# Patient Record
Sex: Male | Born: 1940 | Race: Black or African American | Hispanic: No | Marital: Single | Smoking: Current every day smoker
Health system: Southern US, Community
[De-identification: ages and names within clinical notes are randomized; demographics above are authoritative.]

## PROBLEM LIST (undated history)

## (undated) DIAGNOSIS — E7800 Pure hypercholesterolemia, unspecified: Secondary | ICD-10-CM

## (undated) DIAGNOSIS — N400 Enlarged prostate without lower urinary tract symptoms: Secondary | ICD-10-CM

## (undated) DIAGNOSIS — F209 Schizophrenia, unspecified: Secondary | ICD-10-CM

## (undated) DIAGNOSIS — N3281 Overactive bladder: Secondary | ICD-10-CM

## (undated) DIAGNOSIS — F03918 Unspecified dementia, unspecified severity, with other behavioral disturbance: Secondary | ICD-10-CM

## (undated) DIAGNOSIS — F0391 Unspecified dementia with behavioral disturbance: Secondary | ICD-10-CM

## (undated) DIAGNOSIS — F0390 Unspecified dementia without behavioral disturbance: Secondary | ICD-10-CM

## (undated) DIAGNOSIS — E119 Type 2 diabetes mellitus without complications: Secondary | ICD-10-CM

## (undated) DIAGNOSIS — G309 Alzheimer's disease, unspecified: Secondary | ICD-10-CM

## (undated) DIAGNOSIS — E785 Hyperlipidemia, unspecified: Secondary | ICD-10-CM

## (undated) DIAGNOSIS — F0280 Dementia in other diseases classified elsewhere without behavioral disturbance: Secondary | ICD-10-CM

## (undated) DIAGNOSIS — J449 Chronic obstructive pulmonary disease, unspecified: Secondary | ICD-10-CM

## (undated) DIAGNOSIS — K219 Gastro-esophageal reflux disease without esophagitis: Secondary | ICD-10-CM

## (undated) DIAGNOSIS — I10 Essential (primary) hypertension: Secondary | ICD-10-CM

## (undated) HISTORY — DX: Hyperlipidemia, unspecified: E78.5

## (undated) HISTORY — PX: OTHER SURGICAL HISTORY: SHX169

## (undated) HISTORY — DX: Chronic obstructive pulmonary disease, unspecified: J44.9

## (undated) HISTORY — DX: Benign prostatic hyperplasia without lower urinary tract symptoms: N40.0

## (undated) HISTORY — DX: Type 2 diabetes mellitus without complications: E11.9

## (undated) HISTORY — DX: Unspecified dementia, unspecified severity, with behavioral disturbance: F03.91

## (undated) HISTORY — DX: Unspecified dementia, unspecified severity, with other behavioral disturbance: F03.918

---

## 2001-07-21 ENCOUNTER — Emergency Department (HOSPITAL_COMMUNITY): Admission: EM | Admit: 2001-07-21 | Discharge: 2001-07-21 | Payer: Self-pay | Admitting: *Deleted

## 2001-08-28 ENCOUNTER — Encounter: Payer: Self-pay | Admitting: Emergency Medicine

## 2001-08-28 ENCOUNTER — Inpatient Hospital Stay (HOSPITAL_COMMUNITY): Admission: EM | Admit: 2001-08-28 | Discharge: 2001-09-15 | Payer: Self-pay | Admitting: Emergency Medicine

## 2008-10-16 ENCOUNTER — Inpatient Hospital Stay (HOSPITAL_COMMUNITY): Admission: EM | Admit: 2008-10-16 | Discharge: 2008-10-18 | Payer: Self-pay | Admitting: Emergency Medicine

## 2009-02-13 ENCOUNTER — Ambulatory Visit (HOSPITAL_COMMUNITY): Admission: RE | Admit: 2009-02-13 | Discharge: 2009-02-13 | Payer: Self-pay | Admitting: Pulmonary Disease

## 2009-03-01 ENCOUNTER — Ambulatory Visit (HOSPITAL_COMMUNITY): Admission: RE | Admit: 2009-03-01 | Discharge: 2009-03-01 | Payer: Self-pay | Admitting: Pulmonary Disease

## 2010-09-10 ENCOUNTER — Encounter: Payer: Self-pay | Admitting: Pulmonary Disease

## 2010-11-29 LAB — GLUCOSE, CAPILLARY
Glucose-Capillary: 132 mg/dL — ABNORMAL HIGH (ref 70–99)
Glucose-Capillary: 136 mg/dL — ABNORMAL HIGH (ref 70–99)
Glucose-Capillary: 86 mg/dL (ref 70–99)
Glucose-Capillary: 93 mg/dL (ref 70–99)

## 2010-12-04 LAB — DIFFERENTIAL
Basophils Relative: 0 % (ref 0–1)
Eosinophils Absolute: 0 10*3/uL (ref 0.0–0.7)
Monocytes Absolute: 0 10*3/uL — ABNORMAL LOW (ref 0.1–1.0)
Monocytes Relative: 1 % — ABNORMAL LOW (ref 3–12)
Neutro Abs: 7.5 10*3/uL (ref 1.7–7.7)

## 2010-12-04 LAB — COMPREHENSIVE METABOLIC PANEL
ALT: 113 U/L — ABNORMAL HIGH (ref 0–53)
AST: 156 U/L — ABNORMAL HIGH (ref 0–37)
Albumin: 3.2 g/dL — ABNORMAL LOW (ref 3.5–5.2)
Alkaline Phosphatase: 79 U/L (ref 39–117)
GFR calc Af Amer: >60 mL/min (ref >60)
Potassium: 3.1 mEq/L — ABNORMAL LOW (ref 3.5–5.1)
Sodium: 137 mEq/L (ref 135–145)
Total Protein: 5.3 g/dL — ABNORMAL LOW (ref 6.0–8.3)

## 2010-12-04 LAB — CULTURE, BLOOD (ROUTINE X 2)
Culture: NO GROWTH
Report Status: 3052010

## 2010-12-04 LAB — CBC
Platelets: 163 10*3/uL (ref 150–400)
RDW: 15.7 % — ABNORMAL HIGH (ref 11.5–15.5)

## 2010-12-04 LAB — URINALYSIS, ROUTINE W REFLEX MICROSCOPIC
Glucose, UA: NEGATIVE mg/dL
Hgb urine dipstick: NEGATIVE
Protein, ur: 100 mg/dL — AB

## 2010-12-04 LAB — URINE CULTURE: Colony Count: NO GROWTH

## 2010-12-04 LAB — GLUCOSE, CAPILLARY
Glucose-Capillary: 114 mg/dL — ABNORMAL HIGH (ref 70–99)
Glucose-Capillary: 133 mg/dL — ABNORMAL HIGH (ref 70–99)
Glucose-Capillary: 234 mg/dL — ABNORMAL HIGH (ref 70–99)

## 2010-12-04 LAB — URINE MICROSCOPIC-ADD ON

## 2011-01-01 NOTE — H&P (Signed)
NAME:  Andre Rowe, Andre Rowe           ACCOUNT NO.:  [id number]   MEDICAL RECORD NO.:  [id number]          PATIENT TYPE:  INP   LOCATION:  A329                          FACILITY:  [facility]   PHYSICIAN:  Kingsley Callander. Ouida Sills, MD       DATE OF BIRTH:  1941/05/16   DATE OF ADMISSION:  10/16/2008  DATE OF DISCHARGE:  LH                              HISTORY & PHYSICAL   CHIEF COMPLAINT:  Fever.   HISTORY OF PRESENT ILLNESS:  This patient is a 70 year old African  American schizophrenic who lives in a group home who was found weak,  less responsive, and covered in stool.  He was brought to the emergency  room and evaluated.  He was found to have a fever of 102.9 rectally.  He  had evidence of a urinary tract infection.  He has a history of BPH,  treated with Uroxatral, he is also on Detrol LA.  He is able to provide  only limited history.  He is alert, but his speech is difficult to  understand.  He denies any pain.  He admits that he has felt fevers and  chills.   PAST MEDICAL HISTORY:  1. Schizophrenia.  2. Dementia.  3. Diabetes.  4. Hyperlipidemia.   MEDICATIONS:  1. Klonopin 0.5 mg three times a day and 1 mg at night.  2. Detrol LA 4 mg daily.  3. Lumigan 0.03% 1 drop in each eye nightly.  4. Simvastatin 40 mg nightly.  5. Folic acid 1 mg daily.  6. Uroxatral 10 mg daily.  7. Omeprazole 20 mg daily.  8. Metformin 500 mg b.i.d.  9. Lantus 25 units daily.  10.Seroquel 25 mg nightly.  11.Namenda 10 mg b.i.d.  12.Tegretol 200 mg t.i.d.  13.Zyprexa 5 mg nightly.  14.Aricept 10 mg nightly.  15.Acetaminophen p.r.n.   ALLERGIES:  None.   SOCIAL HISTORY:  He smokes one cigar and about 1 cigarettes a day.  He  does not use alcohol or recreational substances.   FAMILY HISTORY:  Unknown.   REVIEW OF SYSTEMS:  No difficulty breathing.  His appetite has been  good.  He was in his usual state of health until this morning.  He has  not had problems with bowel habits, typically.   PHYSICAL  EXAMINATION:  VITAL SIGNS:  Temperature 102.9, blood pressure  99/53, pulse 105, respirations 22, and oxygen saturation 100%.  GENERAL:  He is alert, has some tardive dyskinesia type facial  movements.  HEENT:  No scleral icterus.  The pharynx is edentulous and moist.  NECK:  Supple with no thyromegaly or JVD.  LUNGS:  Clear.  HEART:  Tachycardic with no murmurs.  Rate is in the 110 range.  ABDOMEN:  Soft and nontender.  No hepatosplenomegaly.  No CVA  tenderness.  EXTREMITIES:  No cyanosis, clubbing, or edema.  NEUROLOGIC:  No focal weakness.  LYMPH NODES:  No cervical or supraclavicular enlargement.   LABORATORY DATA:  Urinalysis reveals many bacteria, 7-10 white cells,  and 100 mg per dL of protein.  Chest x-ray reveals a prominence of the  right hilum with atelectasis versus  a linear scar in the left base.  He  is also undergone a CT scan of the head which revealed no evidence of  acute intracranial abnormality.  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  White count 7.8, hemoglobin 13.5,  MCV 109.5, platelets 163, 97 segs, 3 lymphs, sodium 137, potassium 3.1,  bicarb 29, glucose 155, BUN 21, calcium 8.1, albumin 3.2, SGOT 156, SGPT  113, alkaline phosphatase 79, and bilirubin 0.7.   IMPRESSION:  1. Urinary tract infection.  He has bacteriuria and a change in his      mental status.  His blood pressure is dipped into the 80s systolic      and is currently receiving a fluid bolus with systolic BP back up      into the 90s, blood and urine cultures are being obtained.  He is      being treated with IV Rocephin.  He will require hospitalization.  2. Hypokalemia.  We will replace orally.  3. Diabetes.  We will decrease his Lantus dose to 10 units with      possible reduction and caloric intake with his illness.  He ate      very little earlier today.  4. Prominent pulmonary hila.  CT was recommended for further      evaluation.  5. Schizophrenia.   Continue current behavioral health meds.  6. Dementia.  7. Macrocytosis.  He is on folic acid.  His B12 status is not known.      Further evaluation per Dr. Juanetta Gosling.  8. Abnormal liver function tests, simvastatin will be held and these      numbers will be rechecked as his illnesses treated.  9. Glaucoma.  Continue Lumigan.      Kingsley Callander. Ouida Sills, MD  Electronically Signed     ROF/MEDQ  D:  10/16/2008  T:  10/16/2008  Job:  [id number]   cc:   Ramon Dredge L. Juanetta Gosling, M.D.  Fax: [phone]

## 2011-01-01 NOTE — Group Therapy Note (Signed)
NAME:  Andre Rowe, Andre Rowe           ACCOUNT NO.:  [id number]   MEDICAL RECORD NO.:  [id number]          PATIENT TYPE:  INP   LOCATION:  A329                          FACILITY:  [facility]   PHYSICIAN:  Edward L. Juanetta Gosling, M.D.DATE OF BIRTH:  [birth year]   DATE OF PROCEDURE:  DATE OF DISCHARGE:                                 PROGRESS NOTE   Andre Rowe has been admitted with change in mental status and  probably he has an urinary tract infection.  He is schizophrenic, so it  is difficult to tell about his mental status in general.  This morning,  he is back about to his baseline.  He mutters, but does not really speak  in sentences.   PHYSICAL EXAMINATION:  VITAL SIGNS:  His temperature is 99.3, pulse 101,  respirations 20, blood pressure 90/44, and O2 sat is 93% on room air.  CHEST:  Fairly clear.  HEART:  Regular.  ABDOMEN:  Soft.   ASSESSMENT:  He has got probable urinary tract infection.  He is  confused, but appears closer to his baseline now.   PLAN:  To continue with his antibiotics, fluids, etc.      Edward L. Juanetta Gosling, M.D.  Electronically Signed     ELH/MEDQ  D:  10/17/2008  T:  10/17/2008  Job:  [id number]

## 2011-01-04 NOTE — Group Therapy Note (Signed)
[facility]  Patient:    MAR, WALMER Visit Number: [id number] MRN: [id number]          Service Type: MED Location: 2A Z366 01 Attending Physician:  Hilario Quarry Dictated by:   Kari Baars, M.D. Proc. Date: 09/11/01 Admit Date:  08/28/2001                               Progress Note  SUBJECTIVE:  Mr. Maclellan remains confused but better.  He is more responsive and more communicative.  He continues to have falls, but this is because he is not being cooperative.  He has had restraints on.  He is being kept near the nurses station, but still will not ask for help to get up.  Otherwise he is doing okay.  He has no complaints this morning.  He is eating fairly well.  LABORATORY DATA:  His lab work this morning shows that his pH is 7.39, pO2 of 91, pCO2 of 60.  This is actually the best that he has had in some time.  His white blood count is 5700, hemoglobin 12.8, hematocrit 37.6, platelets 265. His Chem-7 is still pending.  ASSESSMENT:  Overall he does seem to be doing a great deal better, but still with multiple falls.  PLAN:  To continue with his treatments.  Depending upon the results of his Chem-7 today, we may be able to reduce is IV fluids. Dictated by:   Kari Baars, M.D. Attending Physician:  Hilario Quarry DD:  09/11/01 TD:  09/11/01 Job: [id number]

## 2011-01-04 NOTE — Group Therapy Note (Signed)
[facility]  Patient:    KARDER, GOODIN Visit Number: [id number] MRN: [id number]          Service Type: MED Location: 3A A330 01 Attending Physician:  Fredirick Maudlin Dictated by:   Kari Baars, M.D. Proc. Date: 09/14/01 Admit Date:  08/28/2001                               Progress Note  PROBLEMS 1. Diabetic ketoacidosis. 2. Chronic obstructive pulmonary disease. 3. Pneumonia. 4. Multiple electrolyte imbalances. 5. Schizophrenia.  SUBJECTIVE:  Mr. Heinzman continues to improve.  He was actually up and apparently attempting to bathe this morning.  He is still very confused, however, and has been not staying in his room.  OBJECTIVE:  His physical exam this morning shows that his sodium is down to 149, potassium 3.7, chloride 108, CO2 of 33, BUN 8, creatinine 1.1.  His blood sugar is 165.  ASSESSMENT:  He is much improved.  PLAN:  He is okay to move to third floor.  He is pretty much ready for discharge to a nursing home, and a bed can be found. Dictated by:   Kari Baars, M.D. Attending Physician:  Fredirick Maudlin DD:  09/14/01 TD:  09/14/01 Job: [id number]

## 2011-01-04 NOTE — Group Therapy Note (Signed)
[facility]  Patient:    Andre Rowe, Andre Rowe Visit Number: [id number] MRN: [id number]          Service Type: MED Location: 2A Y782 01 Attending Physician:  Hilario Quarry Dictated by:   Kari Baars, M.D. Proc. Date: 09/05/01 Admit Date:  08/28/2001                               Progress Note  PROBLEM 1. Diabetes mellitus. 2. Ketoacidosis. 3. Congestive heart failure. 4. Schizophrenia.  SUBJECTIVE:  The patient is much more alert than he has been.  He is able to respond a little bit.  He has been sitting up.  He has actually attempted to eat a little bit as well.  OBJECTIVE:  His exam this morning shows that his chest is much clearer than before.  He is more alert, although still not fully awake.  His abdomen is soft.  His extremities show no edema.  He did not have any lab work this morning, but his blood sugars are running around 130-140.  He did get out of bed twice again last night.  ASSESSMENT:  He continues having problems with confusion, with his schizophrenia, with his multiple medical problems.  His diabetes mellitus is better-controlled.  His O2 has been better.  He seems to be slowly coming around, although we still have difficulty with lack of cooperation.  PLAN:  Recheck labs tomorrow.  Continue with the alternating D-5-W in one-half normal saline.  Continue with his O2 at 1 L.  Continue antibiotics.  Look for improvement.  Try to get him to eat a little more.  I discussed all of this at length with his brother today, who has power of attorney.  His brother has not been able to come to a decision as to whether he would like a no-code blue order. Dictated by:   Kari Baars, M.D. Attending Physician:  Hilario Quarry DD:  09/05/01 TD:  09/07/01 Job: [id number]

## 2011-01-04 NOTE — Group Therapy Note (Signed)
[facility]  Patient:    Andre Rowe, Andre Rowe Visit Number: [id number] MRN: [id number]          Service Type: MED Location: 2A Y782 01 Attending Physician:  Hilario Quarry Dictated by:   Kari Baars, M.D. Proc. Date: 09/03/01 Admit Date:  08/28/2001                               Progress Note  SUBJECTIVE:  Andre Rowe continues to have problems with his known diabetic ketoacidosis, his electrolyte abnormalities, schizophrenia, and COPD.  He remains sluggish and poorly cooperative.  He has his oxygen off again, and we put it back on him.  He is otherwise about the same.  OBJECTIVE:  His physical exam shows that he is sluggish.  His chest is fairly clear without wheezes.  His heart is regular.  His abdomen is soft.  He is not communicating.  Lab work: His pH is 7.34, PO2 is 30, PCO2 is 63.  His O2 saturation was 72%; it is 88% now on 1 liter of O2.  He may have to go up on the O2, but first I would like to see what he does with leaving the oxygen on if we can keep it on him.  His white blood count is not back.  His metabolic 7 shows that his sodium is down to 173, potassium 3.94, chloride 133, bicarbonate 30, BUN 11, creatinine 1.2.  His sugar is 149.  ASSESSMENT:  He has multiple electrolyte abnormalities.  His sodium is still not down to normal.  He has chronic obstructive pulmonary disease and still has elevated CO2 and decreased PO2.  He has had problems with his potassium, but that is better.  His blood sugar appears to be doing well, and then he still has problems with cooperation.   I am afraid that will not make much change.  PLAN:  Continue with his fluids including potassium replacement for now, get his oxygen back on, continue with his q.4h. Accu-Cheks with sliding scale to keep control of his blood sugar, continue with safety measures so that he will not get hurt, and continue other medications as before.  Will repeat lab  work tomorrow. Dictated by:   Kari Baars, M.D. Attending Physician:  Hilario Quarry DD:  09/03/01 TD:  09/05/01 Job: [id number] NF/[id number]

## 2011-01-04 NOTE — Discharge Summary (Signed)
NAME:  Andre Rowe, Andre Rowe           ACCOUNT NO.:  [id number]   MEDICAL RECORD NO.:  [id number]          PATIENT TYPE:  INP   LOCATION:  A329                          FACILITY:  [facility]   PHYSICIAN:  Edward L. Juanetta Gosling, M.D.DATE OF BIRTH:  August 05, 1941   DATE OF ADMISSION:  10/16/2008  DATE OF DISCHARGE:  03/02/2010LH                               DISCHARGE SUMMARY   FINAL DISCHARGE DIAGNOSES:  1. Febrile illness, possible urinary tract infection but with negative      urine culture.  2. Schizophrenia.  3. Dementia.  4. Diabetes.  5. Hyperlipidemia.  6. Benign prostatic hypertrophy.  7. Gastroesophageal reflux disease.   Andre Rowe is a 70 year old who lives in a group home.  He was found  to be weak and less responsive and he was covered in stool.  He came to  the emergency room and was noted to have a temperature of 102.9 and had  evidence of UTI on his urinalysis.  His speech is very difficult to  understand so his history was limited.  Physical examination showed a  well-developed, well-nourished [place] American male.  Temperature was  102.9, blood pressure 99/53, pulse 105, respirations were 22.  He had  some tardive dyskinesia.  His chest was fairly clear.  His heart was  regular.  His abdomen was soft without masses.  Urinalysis showed many  white blood cells and bacteria.  CT showed prominence of the right  hilum.  CT brain showed no acute intracranial abnormality.  His white  blood count was 7800.  Assessment at the time of admission was that he  had a UTI.  He was started on intravenous antibiotics and given IV  fluids.  As he improved, however, he got very agitated and combative.  He pulled his IV on multiple occasions and actually simply disconnected  it on multiple occasions.  He was switched to p.o. antibiotics.  He did  not grow anything in his urine culture.  He was better after about 48  hours and is discharged back to his group home on:   DISCHARGE MEDICATIONS:  1. Klonopin 0.5 three times a day and 1 mg at night.  2. Detrol LA 4 mg daily.  3. Lumigan 0.03 one drop in each eye nightly.  4. Simvastatin 40 mg at bedtime.  5. Folic acid 1 mg daily.  6. Uroxatral 10 mg daily.  7. Omeprazole 20 mg daily.  8. Metformin 500 mg b.i.d.  9. Lantus 25 units daily.  10.Seroquel 25 mg nightly.  11.Namenda 10 mg b.i.d.  12.Tegretol 200 mg t.i.d.  13.Zyprexa 5 mg nightly.  14.Aricept 10 mg nightly.  15.Acetaminophen p.r.n.  16.Cipro 500 mg b.i.d.   He does have a prominence on his chest x-ray and that will need to be  reevaluated with a CT if he will allow [place] to do that.  He is going to  have some home health services and then follow up in my office.      Edward L. Juanetta Gosling, M.D.  Electronically Signed     ELH/MEDQ  D:  10/19/2008  T:  10/19/2008  Job:  [id number]

## 2011-01-04 NOTE — Group Therapy Note (Signed)
[facility]  Patient:    Andre Rowe, Andre Rowe Visit Number: [id number] MRN: [id number]          Service Type: MED Location: 2A Z308 01 Attending Physician:  Hilario Quarry Dictated by:   Kari Baars, M.D. Proc. Date: 09/04/01 Admit Date:  08/28/2001                               Progress Note  PROBLEMS: 1. Multiple electrolyte abnormalities. 2. Diabetes. 3. Chronic obstructive pulmonary disease. 4. Schizophrenia.  SUBJECTIVE:  Andre Rowe is actually more alert today than he has been.  He is minimally responsive and at least answers some questions appropriately.  OBJECTIVE:  He still does not wear his oxygen, but his PO2 has come up to 53 this morning, PCO2 57, pH 7.39.  His white blood count is 7400, hemoglobin is down to 14.1 which I think means he is equilibrating as far as his fluid and electrolyte balance is concerned.  His platelets are 92,000.  His Chem-7 this morning shows the sodium is 171, down from 173, down from its peak of about 180.  Potassium is 3.6, chloride 133, bicarbonate 31, BUN 7, creatinine 1.2. His blood sugar 131, and it has been less than 200 pretty much all day.  His chest is clear without wheezes, rales, or rhonchi.  His heart is regular without murmur, gallop, or rub.  His abdomen is soft.  His extremities show no edema.  ASSESSMENT:  He is improved.  PLAN:  Will change his Accu-Cheks to q.4h. now, continue with his IV fluids as before, continue all his other medications as before.  Will try to see if he can eat something today.  I am very pleased with his improvement, and hopefully his electrolytes will continue to improve. Dictated by:   Kari Baars, M.D. Attending Physician:  Hilario Quarry DD:  09/04/01 TD:  09/06/01 Job: [id number]

## 2011-01-04 NOTE — Group Therapy Note (Signed)
Andre Rowe  Patient:    Andre Rowe, Andre Rowe Visit Number: [id number] MRN: [id number]          Service Type: MED Location: 2A N027 01 Attending Physician:  Hilario Quarry Dictated by:   Kari Baars, M.D. Admit Date:  08/28/2001                               Progress Note  PROBLEMS: 1. Diabetic ketoacidosis. 2. Multiple electrolyte imbalances. 3. Chronic obstructive pulmonary disease. 4. Pneumonia. 5. Schizophrenia.  SUBJECTIVE:  Andre Rowe is about the same.  He is still more alert than he has been in the past.  He is able to communicate a little bit.  He has no new problems noted.  OBJECTIVE:  His exam today shows that his blood pressure is running 120, he is afebrile, his pulse is 70, his chest is pretty clear with some rhonchi, his heart is regular, his abdomen is soft.  ASSESSMENT:  He is doing better.  PLAN:  He is to have lab work again tomorrow and continue all of his other treatments in the meantime. Dictated by:   Kari Baars, M.D. Attending Physician:  Hilario Quarry DD:  09/11/01 TD:  09/11/01 Job: [id number]

## 2011-01-04 NOTE — Group Therapy Note (Signed)
[facility]  Patient:    Andre Rowe, Andre Rowe Visit Number: [id number] MRN: [id number]          Service Type: MED Location: 2A A213 01 Attending Physician:  Hilario Quarry Dictated by:   Kari Baars, M.D. Proc. Date: 09/08/01 Admit Date:  08/28/2001                               Progress Note  PROBLEMS 1. Chronic obstructive pulmonary disease with pneumonia. 2. Diabetes mellitus with diabetic ketoacidosis. 3. Schizophrenia.  SUBJECTIVE:  Andre Rowe is more responsive this morning.  He seems to have some problem with swallowing liquids, so we are going to add "Thick-IT" to his liquid feedings and see if he can swallow that better.  He is more alert, a little bit responsive.  No new complaints have been noted.  He is still not being very cooperative and still having problems with keeping the oxygen on him.  His chest is clearer than it has been.  He still does not seem to have any edema.  CNS:  Shows that he is more responsive, but still pretty sluggish.  LABORATORY DATA:  This morning shows his blood gas showed a pH of 7.38, PO2 of 46, PCO2 of 57.  This is with his oxygen off.  We are trying to get him to wear his oxygen again.  His CBC yesterday showed a white count of 6100.  His BMP this morning shows that his sodium is down in the 150s.  I do not have the total report yet, but it is better.  ASSESSMENT:  He does seem to be improving.  PLAN:  To continue with his treatments and continue with his medications.  No changes today.  I will continue with his sliding scale every four hour blood sugars and IV fluids at 200 cc per hour. Dictated by:   Kari Baars, M.D. Attending Physician:  Hilario Quarry DD:  09/08/01 TD:  09/08/01 Job: [id number]

## 2011-01-04 NOTE — Group Therapy Note (Signed)
[facility]  Patient:    Andre Rowe, Andre Rowe Visit Number: [id number] MRN: [id number]          Service Type: MED Location: 2A Y782 01 Attending Physician:  Hilario Quarry Dictated by:   Kari Baars, M.D. Proc. Date: 09/12/01 Admit Date:  08/28/2001                               Progress Note  PROBLEMS: 1. Diabetic ketoacidosis. 2. Diabetes. 3. Chronic obstructive pulmonary disease. 4. Pneumonia. 5. Schizophrenia. 6. Confusion. 7. Multiple electrolyte imbalances.  SUBJECTIVE:  Mr. Khader is better this morning.  He is more alert.  He is somewhat responsive.  There have been no new problems noted by the nursing staff.  He has been able to eat.  He has not had falls as he had before.  OBJECTIVE:  His physical examination today shows that his chest is pretty clear.  His heart is regular.  His abdomen is soft.  Extremities showed no edema.  He looks better.  I have reduced his IV fluids.  His sodium is now down around 150.  His lab work shows sodium 147, potassium 3.9, chloride 108, CO2 34, BUN 6, creatinine 1.1, blood sugar 270.  ASSESSMENT:  He is much improved.  PLAN:  Continue with his treatments.  Continue his medication.  I am going to discontinue his IV fluids now as he is much improved.  He is okay to be transferred off the stepdown unit if a bed is needed. Dictated by:   Kari Baars, M.D. Attending Physician:  Hilario Quarry DD:  09/12/01 TD:  09/13/01 Job: [id number]

## 2011-01-04 NOTE — Group Therapy Note (Signed)
[facility]  Patient:    Andre Rowe, Andre Rowe Visit Number: [id number] MRN: [id number]          Service Type: MED Location: 2A H846 01 Attending Physician:  Hilario Quarry Dictated by:   Kari Baars, M.D. Proc. Date: 08/29/01 Admit Date:  08/28/2001                               Progress Note  PROBLEM:  Diabetic ketoacidosis.  SUBJECTIVE:  Mr. Thayer is uncooperative.  He is confused, I think, and not really able to cooperate.  There have been no new complaints.  He has pulled his Foley catheter out.  He keeps pulling off his monitor.  OBJECTIVE:  His chest is actually fairly clear.  He is not responding to questions at least in an intelligible manner.  His laboratory shows that his PO2 is 56, pH 7.25, PCO2 47.  I suspect that he does have some COPD as well as all of his other medical problems.  His white blood cell count is 18,400, hemoglobin 15.4, hematocrit 45.8, platelets 309.  He does have what appears to be pneumonia on chest x-ray as well as the COPD.  His sodium level is 169, which has actually gone up.  Potassium 4.1, chloride 132, bicarbonate 23, BUN 16, creatinine 1.5, blood sugar 399 on the metabolic 7 this morning.  ASSESSMENT:  He has continued severe problems with electrolyte imbalance.  I am probably going to have to switch him to a further hypotonic solution if available.  I really do not want to give him one that has D5, however.  Will continue with all of his treatments.  He does need Lovenox because he is pretty much at bed rest.  Continue IV fluids, continue insulin as needed, and follow.  He will be on nebulizers as well for his chronic obstructive pulmonary disease. Dictated by:   Kari Baars, M.D. Attending Physician:  Hilario Quarry DD:  08/29/01 TD:  08/31/01 Job: [id number]

## 2011-01-04 NOTE — Group Therapy Note (Signed)
[facility]  Patient:    Andre Rowe, Andre Rowe Visit Number: [id number] MRN: [id number]          Service Type: MED Location: 2A Y782 01 Attending Physician:  Hilario Quarry Dictated by:   Kari Baars, M.D. Proc. Date: 09/01/01 Admit Date:  08/28/2001                               Progress Note  DIAGNOSES 1. Diabetic ketoacidosis. 2. Chronic obstructive pulmonary disease. 3. Pneumonia. 4. Respiratory failure. 5. Multiple electrolyte abnormalities. 6. Confusion. 7. Schizophrenia.  SUBJECTIVE:  Mr. Andre Rowe has gotten out of bed twice last night, despite being restrained.  He remains in essence unresponsive.  I had a long discussion with his brother yesterday.  His brother is struggling with a decision as to what to do for Andre Rowe, if he were to have a respiratory or cardiac arrest.  No decision has been made at this point.  OBJECTIVE:  The physical exam shows that he is unresponsive.  His chest shows marked bilateral rhonchi.  His heart is regular, without gallop.  His abdomen is soft.  CNS:  Shows that although he responds to stimulation, he is really not meaningful responsive.  LABORATORY DATA:  Shows deterioration in his blood gases.  His PO2 is 72, PCO2 of 76 now, up from 48 on August 30, 2001.  The pH is 7.23.  I suspect that some of this is because of sedation, which has been necessary for his safety. His white blood count yesterday was 12,400, hemoglobin 17.  I think some of that is hemoconcentration.  His Chem-7 this morning showed that his sodium has gone up to 181.  His BUN is 2, creatinine 1.5, chloride 140.  His blood sugar was 268.  ASSESSMENT:  His situation is quite a difficult electrolyte management problem, because he continues to pull his IVs out.  He is not able to cooperate.  He is diabetic, with ketoacidosis on admission.  PLAN:  He needs D-5 now, but is going to be difficult to manage him with D-5, considering  his diabetes mellitus.  Will go ahead now and alternate D-5-W at 200 with half normal saline at 200.  Add small doses of Lasix I think.  Will see how he does with his urine output, and follow.  There is little else to do at this point. Dictated by:   Kari Baars, M.D. Attending Physician:  Hilario Quarry DD:  09/01/01 TD:  09/01/01 Job: [id number]

## 2011-01-04 NOTE — Group Therapy Note (Signed)
[facility]  Patient:    Andre Rowe, Andre Rowe Visit Number: [id number] MRN: [id number]          Service Type: MED Location: 2A Y782 01 Attending Physician:  Hilario Quarry Dictated by:   Kari Baars, M.D. Proc. Date: 09/07/01 Admit Date:  08/28/2001                               Progress Note  PROBLEMS 1. Diabetic ketoacidosis. 2. Schizophrenia. 3. Electrolyte imbalances. 4. Chronic obstructive pulmonary disease. 5. Pneumonia.  SUBJECTIVE:  Andre Rowe is a little more alert today than he was.  He has no new complaints, and no new problems identified.  OBJECTIVE:  He is still poorly responsive, but will awaken.  His chest is clear.  His heart is regular.  His abdomen is soft.  LABORATORY DATA:  His pH is 7.34, which is better, pO2 of 91.  He is actually wearing his oxygen.  The pCO2 is 67.  His CBC shows a white count of 6100, hemoglobin 13.9, platelets still down a bit at 104.  His sodium  has come down to 163, which is considerably better than before.  ASSESSMENT:  He is perhaps somewhat better.  This is reflected in his laboratory work today.  PLAN:  Considering all of this, I think we should simply "hold the course." Continue medicines, with no changes.  I do not think there is a great deal else that I can add. Dictated by:   Kari Baars, M.D. Attending Physician:  Hilario Quarry DD:  09/07/01 TD:  09/07/01 Job: [id number]

## 2011-01-04 NOTE — Discharge Summary (Signed)
[facility]  Patient:    Andre Rowe, Andre Rowe Visit Number: [id number] MRN: [id number]          Service Type: MED Location: 3A A330 01 Attending Physician:  Fredirick Maudlin Dictated by:   Kari Baars, M.D. Admit Date:  08/28/2001 Disc. Date: 09/15/01                             Discharge Summary  FINAL DISCHARGE DIAGNOSES:  1.  New onset diabetes.  2.  Diabetic ketoacidosis.  3.  Chronic obstructive pulmonary disease.  4.  Pneumonia.  5.  Dementia.  6.  Chronic schizophrenia.  7.  Multiple electrolyte abnormalities.  8.  Dehydration.  HISTORY OF PRESENT ILLNESS:  Andre Rowe is a 70 year old, who was in his usual state of fairly poor health at his rest home level of care, when he developed bleeding from the rectum apparently.  This was found to apparently be from fairly inconsequential rectal bleeding in the emergency room, but he was found to have a low pH, a very high blood sugar - which is something new, and what appeared to be diabetic ketoacidosis.  He was admitted to a monitored bed and started on an insulin continuous infusion.  PHYSICAL EXAMINATION:  GENERAL:  Examination when he was admitted showed that he was poorly responsive.  RECTAL:  He had heme positive stool which was bright red blood.  CHEST:  The chest was relatively clear, with some rhonchi.  VITAL SIGNS:  Pulse was 110, blood pressure 150/80, respirations 22.  LABORATORY DATA:  His pH was 7.22, pO2 was 63, pCO2 38.  WBC 13,100, hemoglobin 15.8.  Sodium 154, potassium 4.5, chloride 115, CO2 18, BUN 24, creatinine 1.7, blood sugar 545.  HOSPITAL COURSE:  He was begun on insulin infusion, IV fluids at a rapid rate. He had further increase in his sodium level despite the fact that he appeared to be dehydrated, and was given large amounts of fluid.  His fluids were adjusted and he eventually improved over a fairly prolonged period of time. He became much more  responsive and was back to his usual level of responsiveness.  He was switched over to oral medications.  By the time of discharge he was discharged to a skilled care facility.  DISCHARGE MEDICATIONS:  1.  Ceftin 250 mg p.o. b.i.d. x5 more days.  2.  Folic acid 1 mg p.o. q.d.  3.  Lantus insulin 25 units at night.  Accu-Cheks a.c. and h.s. with a      sliding-scale of:      a.  Accu-Chek less than 200 0 units.      b.  For 200-249 4 units.      c.  For 250-299 6 units.      d.  For 300-349 8 units.      e.  For 350-399 10 units.      f.  For 400-450 12 units.      g.  For greater than 450 20 units.  4.  Multivitamin q.d.  5.  Protonix 40 mg q.d.  6.  Zyprexa 2.5 mg at bedtime.  7.  Ativan 1 mg p.o. q.2h p.r.n. agitation.  8.  Albuterol and Atrovent nebulizer treatments if needed and if he will      allow them to be given. Dictated by:   Kari Baars, M.D. Attending Physician:  Fredirick Maudlin DD:  09/15/01 TD:  09/15/01 Job:  [id number]
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
## 2011-01-04 NOTE — Group Therapy Note (Signed)
[facility]  Patient:    Andre Rowe, Andre Rowe Visit Number: [id number] MRN: [id number]          Service Type: MED Location: 3A A330 01 Attending Physician:  Fredirick Maudlin Dictated by:   Kari Baars, M.D. Admit Date:  08/28/2001 Discharge Date: 09/15/2001                               Progress Note  PROBLEMS:  Diabetic ketoacidosis, chronic obstructive pulmonary disease, pneumonia.  SUBJECTIVE:  Mr. Andre Rowe is overall improved.  He is able to communicate, he is able to get up and move around a little bit and is at this point at maximum hospital benefit.  There is a possibility that he will be transferred to the nursing home later today, which I think is reasonable.  He is not cooperating to have his nebulizer treatment, so I have discontinued them.  ASSESSMENT:  He is improved.  PLAN:  Plan is to continue with treatments and follow.  Discharge if a bed becomes available. Dictated by:   Kari Baars, M.D. Attending Physician:  Fredirick Maudlin DD:  09/15/01 TD:  09/15/01 Job: [id number]

## 2011-01-04 NOTE — Group Therapy Note (Signed)
[facility]  Patient:    Andre Rowe, Andre Rowe Visit Number: [id number] MRN: [id number]          Service Type: MED Location: 2A Y865 01 Attending Physician:  Hilario Quarry Dictated by:   Kari Baars, M.D. Admit Date:  08/28/2001                               Progress Note  PROBLEMS:  Diabetic ketoacidosis, chronic obstructive pulmonary disease with pneumonia, multiple electrolyte abnormalities, schizophrenia.  SUBJECTIVE:  Andre Rowe is more alert this morning.  He is still sitting at the nurses station because of concerns about his safety.  There are no new problems noted.  He has not had any recent falls.  PHYSICAL EXAMINATION  GENERAL:  He is more alert than he has been.  HEART:  Regular without gallop.  ABDOMEN:  Soft.  EXTREMITIES:  No edema.  LABORATORIES:  Chem-7 shows sodium 158 which is the same as yesterday, potassium 3.6, chloride 116, bicarbonate 34, BUN 3, creatinine 1.0, blood sugar 106.  ASSESSMENT:  He is slowly improving.  PLAN:  Continue with his treatments.  He has had a speech consultation.  It was felt that he simply had decreased level of consciousness and not an actual swallowing problem so he is going to continue on his medications as before. His sodium level is still not where I would like it so he needs to continue on his IV fluids with close attention to making sure that he does not develop congestive heart failure from fluid overload.  Will plan to continue all the other medications and follow. Dictated by:   Kari Baars, M.D. Attending Physician:  Hilario Quarry DD:  09/09/01 TD:  09/10/01 Job: [id number]

## 2011-01-04 NOTE — Group Therapy Note (Signed)
[facility]  Patient:    Andre Rowe, Andre Rowe Visit Number: [id number] MRN: [id number]          Service Type: MED Location: 2A Y782 01 Attending Physician:  Hilario Quarry Dictated by:   Kari Baars, M.D. Admit Date:  08/28/2001                               Progress Note  PROBLEMS: 1. Diabetic ketoacidosis. 2. Schizophrenia. 3. Poor responsiveness.  SUBJECTIVE:  Andre Rowe continues to have difficulties largely because he is unresponsive as far as any sort of purposeful response, but he gets quite agitated and pulls his IVs out.  He has pulled out his Foley catheter twice. He is very poorly compliant.  OBJECTIVE:  His physical exam shows that he is sluggishly responsive this morning, but really not making any purposeful response.  His chest is relatively clear.  Blood pressure 130/0, pulse 80.  His CNS shows that he is very sluggish.  His laboratory work shows that his pH is 7.33, which is better, with a pO2 of 55 and a pCO2 of 48.  His white blood cell count is 15,100, hemoglobin 17, and platelets 294.  His Chem-7 was pending yesterday and showed a sodium of 169.  ASSESSMENT:  Unfortunately there is a great deal else that we can do.  He is on Ativan now to try to help with agitation.  I will try to get him back onto some of his other medications, including his antipsychotics, but if he cannot get more responsive we are going to have a difficult time trying to get control of his CNS situation.  He is receiving his Zyprexa.  He is on Rocephin and also Protonix.  PLAN:  Continue with his treatments.  Continue IV Ativan as needed and insulin.  I will discontinue the monitor.  He keeps pulling it off and I do not see much point in continuing the monitor since he is not able to leave it on. Dictated by:   Kari Baars, M.D. Attending Physician:  Hilario Quarry DD:  08/30/01 TD:  08/31/01 Job: [id number]

## 2011-01-04 NOTE — H&P (Signed)
[facility]  Patient:    Andre Rowe, Andre Rowe Visit Number: [id number] MRN: [id number]          Service Type: MED Location: 2A M578 01 Attending Physician:  Hilario Quarry Dictated by:   Kari Baars, M.D. Admit Date:  08/28/2001                           History and Physical  CHIEF COMPLAINT:  Diabetic ketoacidosis.  HISTORY OF PRESENT ILLNESS:  Andre Rowe is a 70 year old African-American male, who is admitted because of diabetic ketoacidosis.  He was actually brought to the emergency room from his rest home, where he normally lives, because of bleeding from his rectum.  He has had several episodes of rectal bleeding and in the past his brother, who has power of attorney, has really not wanted him to have extensive work-up because he also has a number of other severe problems - mostly schizophrenia and problems with dementia.  He developed some rectal bleeding on the day of admission and was brought to the emergency room.  He has been treated fairly recently with Zyprexa.  He had been doing fairly well as far as his behavior is concerned.  His last psychiatric hospitalization apparently was about a year ago.  When evaluated in the emergency room he was noted to be short of breath and had a blood gas done which showed acidemia, which appeared to be metabolic, and he was further evaluated and found to have a blood sugar of about 500.  He has not had a previous history of diabetes.  PAST MEDICAL HISTORY:  1.  Schizophrenia.  2.  Dementia.  3.  Has had episodes of rectal bleeding, as mentioned.  4.  History of hypertension.  SOCIAL HISTORY:  He lives at a rest home.  He previously has had a history of smoking cigarettes and alcohol use but I do not know if the cigarette smoking is continuing now.  FAMILY HISTORY:  Positive for some ulcer disease.  REVIEW OF SYSTEMS:  Really not obtainable right now.  PHYSICAL EXAMINATION:  GENERAL:   Well-developed, well-nourished male, who does not appear to be in any acute distress.  He is pretty quiet and not speaking.  VITAL SIGNS:  Blood pressure 150/80.  Pulse 110.  Respirations 22.  HEENT:  Mucous membranes dry.  Tympanic membrane are intact.  PERRLA. Nose and throat clear.  NECK:  Supple without masses.  CHEST:  Fairly clear without wheezes, rales or rhonchi.  HEART:  Regular without murmurs, rubs, or gallops.  ABDOMEN:  Soft, without masses.  RECTAL:  Heme positive stool which is bright red.  NEUROLOGIC:  He is poorly responsive, as mentioned.  LABORATORY DATA:  Arterial blood gas shows a pH of 7.22, pO2 63, pCO2 38, bicarbonate 16.  WBC 13,100, hemoglobin 15.8, hematocrit 46.9; platelets 303,000.  Chem 7 shows sodium is 154, potassium 4.5, chloride 115, CO2 18, BUN 24, creatinine 1.7, blood sugar 545.  Coagulation shows a prothrombin time of 14.2.  Urinalysis is essentially negative, few white blood cells.  Acetone in the blood is pending.  ASSESSMENT:  Diabetic ketoacidosis.  This is a new diagnosis.  PLAN:  Admit for intravenous fluids.  Will start him on intravenous insulin drip and repeat laboratory work in several hours.  I have not had a chance to talk to Andre Rowe family and have been unable to do so thus far. Dictated by:   Ramon Dredge  Juanetta Gosling, M.D. Attending Physician:  Hilario Quarry DD:  08/28/01 TD:  08/29/01 Job: [id number]

## 2011-01-04 NOTE — Group Therapy Note (Signed)
[facility]  Patient:    Andre Rowe, Andre Rowe Visit Number: [id number] MRN: [id number]          Service Type: MED Location: 2A N629 01 Attending Physician:  Hilario Quarry Dictated by:   Kari Baars, M.D. Admit Date:  08/28/2001                               Progress Note  PROBLEMS:  Chronic obstructive pulmonary disease with respiratory failure, diabetes with diabetic ketoacidosis, multiple electrolyte abnormalities multifactorial, schizophrenia.  HISTORY:  Mr. Fairley is a little more alert this morning.  He is not perfectly awake but he is more alert than I have seen him in the last two to three days.  He is still having problems with not being cooperative and he has fallen at least twice.  He now has mattresses beside his bed in an attempt to keep him from hurting himself if he falls.  He is not wearing his oxygen this morning because he keeps taking it off.  PHYSICAL EXAMINATION  CHEST:  Relatively clear.  GENERAL:  He is a little more alert.  HEART:  Regular.  ABDOMEN:  Soft.  LABORATORIES:  Blood gas shows pH 7.34 which is better, pCO2 61 which is also better, but pO2 is only 37 which is not better.  He is on room air with that blood gas and I requested that he restart his oxygen.  White blood count 11,500, hemoglobin 15.8 which is down slightly and I suspect that is because he is better hydrated, platelets 171,000.  Chem-7 this morning shows that his sodium level is 174 which is down from 181, chloride 137 down from 140, BUN 17, creatinine 1.4 (these are both slightly down), blood sugar of 349, potassium slightly low 3.4, bicarbonate level 32.  ASSESSMENT:  He does have some improvement in his electrolytes.  He is very hypoxemic.  He is still not very responsive and still has multiple problems.  PLAN:  Continue with his treatments and medicines without change and ask for him to be back on his oxygen, add potassium to his IV  fluids.  Will recheck laboratories tomorrow. Dictated by:   Kari Baars, M.D. Attending Physician:  Hilario Quarry DD:  09/02/01 TD:  09/03/01 Job: [id number]

## 2011-09-04 DIAGNOSIS — E1149 Type 2 diabetes mellitus with other diabetic neurological complication: Secondary | ICD-10-CM | POA: Diagnosis not present

## 2011-09-04 DIAGNOSIS — E119 Type 2 diabetes mellitus without complications: Secondary | ICD-10-CM | POA: Diagnosis not present

## 2011-11-20 DIAGNOSIS — E119 Type 2 diabetes mellitus without complications: Secondary | ICD-10-CM | POA: Diagnosis not present

## 2011-11-20 DIAGNOSIS — E1149 Type 2 diabetes mellitus with other diabetic neurological complication: Secondary | ICD-10-CM | POA: Diagnosis not present

## 2012-01-14 DIAGNOSIS — F259 Schizoaffective disorder, unspecified: Secondary | ICD-10-CM | POA: Diagnosis not present

## 2012-01-21 DIAGNOSIS — K219 Gastro-esophageal reflux disease without esophagitis: Secondary | ICD-10-CM | POA: Diagnosis not present

## 2012-01-21 DIAGNOSIS — F209 Schizophrenia, unspecified: Secondary | ICD-10-CM | POA: Diagnosis not present

## 2012-01-21 DIAGNOSIS — E785 Hyperlipidemia, unspecified: Secondary | ICD-10-CM | POA: Diagnosis not present

## 2012-01-21 DIAGNOSIS — J449 Chronic obstructive pulmonary disease, unspecified: Secondary | ICD-10-CM | POA: Diagnosis not present

## 2012-01-29 DIAGNOSIS — E1149 Type 2 diabetes mellitus with other diabetic neurological complication: Secondary | ICD-10-CM | POA: Diagnosis not present

## 2012-01-29 DIAGNOSIS — E119 Type 2 diabetes mellitus without complications: Secondary | ICD-10-CM | POA: Diagnosis not present

## 2012-02-17 DIAGNOSIS — Z79899 Other long term (current) drug therapy: Secondary | ICD-10-CM | POA: Diagnosis not present

## 2012-04-08 DIAGNOSIS — E119 Type 2 diabetes mellitus without complications: Secondary | ICD-10-CM | POA: Diagnosis not present

## 2012-04-08 DIAGNOSIS — E1149 Type 2 diabetes mellitus with other diabetic neurological complication: Secondary | ICD-10-CM | POA: Diagnosis not present

## 2012-05-26 DIAGNOSIS — F259 Schizoaffective disorder, unspecified: Secondary | ICD-10-CM | POA: Diagnosis not present

## 2012-05-28 DIAGNOSIS — E109 Type 1 diabetes mellitus without complications: Secondary | ICD-10-CM | POA: Diagnosis not present

## 2012-05-28 DIAGNOSIS — J449 Chronic obstructive pulmonary disease, unspecified: Secondary | ICD-10-CM | POA: Diagnosis not present

## 2012-05-28 DIAGNOSIS — K219 Gastro-esophageal reflux disease without esophagitis: Secondary | ICD-10-CM | POA: Diagnosis not present

## 2012-05-28 DIAGNOSIS — F209 Schizophrenia, unspecified: Secondary | ICD-10-CM | POA: Diagnosis not present

## 2012-06-17 DIAGNOSIS — E119 Type 2 diabetes mellitus without complications: Secondary | ICD-10-CM | POA: Diagnosis not present

## 2012-06-17 DIAGNOSIS — Z23 Encounter for immunization: Secondary | ICD-10-CM | POA: Diagnosis not present

## 2012-06-17 DIAGNOSIS — E1149 Type 2 diabetes mellitus with other diabetic neurological complication: Secondary | ICD-10-CM | POA: Diagnosis not present

## 2012-06-24 DIAGNOSIS — H2510 Age-related nuclear cataract, unspecified eye: Secondary | ICD-10-CM | POA: Diagnosis not present

## 2012-06-24 DIAGNOSIS — H4011X0 Primary open-angle glaucoma, stage unspecified: Secondary | ICD-10-CM | POA: Diagnosis not present

## 2012-06-24 DIAGNOSIS — H409 Unspecified glaucoma: Secondary | ICD-10-CM | POA: Diagnosis not present

## 2012-06-24 DIAGNOSIS — Z794 Long term (current) use of insulin: Secondary | ICD-10-CM | POA: Diagnosis not present

## 2012-06-24 DIAGNOSIS — E119 Type 2 diabetes mellitus without complications: Secondary | ICD-10-CM | POA: Diagnosis not present

## 2012-08-18 DIAGNOSIS — F259 Schizoaffective disorder, unspecified: Secondary | ICD-10-CM | POA: Diagnosis not present

## 2012-08-26 DIAGNOSIS — E1149 Type 2 diabetes mellitus with other diabetic neurological complication: Secondary | ICD-10-CM | POA: Diagnosis not present

## 2012-08-26 DIAGNOSIS — E119 Type 2 diabetes mellitus without complications: Secondary | ICD-10-CM | POA: Diagnosis not present

## 2012-11-06 DIAGNOSIS — E1149 Type 2 diabetes mellitus with other diabetic neurological complication: Secondary | ICD-10-CM | POA: Diagnosis not present

## 2012-11-06 DIAGNOSIS — E119 Type 2 diabetes mellitus without complications: Secondary | ICD-10-CM | POA: Diagnosis not present

## 2012-11-14 DIAGNOSIS — J449 Chronic obstructive pulmonary disease, unspecified: Secondary | ICD-10-CM | POA: Diagnosis not present

## 2012-11-23 DIAGNOSIS — F209 Schizophrenia, unspecified: Secondary | ICD-10-CM | POA: Diagnosis not present

## 2012-11-23 DIAGNOSIS — J449 Chronic obstructive pulmonary disease, unspecified: Secondary | ICD-10-CM | POA: Diagnosis not present

## 2012-11-23 DIAGNOSIS — E785 Hyperlipidemia, unspecified: Secondary | ICD-10-CM | POA: Diagnosis not present

## 2012-12-08 DIAGNOSIS — F259 Schizoaffective disorder, unspecified: Secondary | ICD-10-CM | POA: Diagnosis not present

## 2013-01-29 DIAGNOSIS — E119 Type 2 diabetes mellitus without complications: Secondary | ICD-10-CM | POA: Diagnosis not present

## 2013-01-29 DIAGNOSIS — E1149 Type 2 diabetes mellitus with other diabetic neurological complication: Secondary | ICD-10-CM | POA: Diagnosis not present

## 2013-02-16 DIAGNOSIS — F259 Schizoaffective disorder, unspecified: Secondary | ICD-10-CM | POA: Diagnosis not present

## 2013-03-20 ENCOUNTER — Emergency Department (HOSPITAL_COMMUNITY)
Admission: EM | Admit: 2013-03-20 | Discharge: 2013-03-20 | Disposition: A | Discharge status: Home / Self Care | Payer: Medicare Other | Attending: Emergency Medicine | Admitting: Emergency Medicine

## 2013-03-20 ENCOUNTER — Encounter (HOSPITAL_COMMUNITY): Payer: Self-pay | Admitting: Emergency Medicine

## 2013-03-20 DIAGNOSIS — F17200 Nicotine dependence, unspecified, uncomplicated: Secondary | ICD-10-CM | POA: Insufficient documentation

## 2013-03-20 DIAGNOSIS — Z79899 Other long term (current) drug therapy: Secondary | ICD-10-CM | POA: Diagnosis not present

## 2013-03-20 DIAGNOSIS — L03317 Cellulitis of buttock: Secondary | ICD-10-CM | POA: Diagnosis not present

## 2013-03-20 DIAGNOSIS — E119 Type 2 diabetes mellitus without complications: Secondary | ICD-10-CM | POA: Insufficient documentation

## 2013-03-20 DIAGNOSIS — G309 Alzheimer's disease, unspecified: Secondary | ICD-10-CM | POA: Insufficient documentation

## 2013-03-20 DIAGNOSIS — F0280 Dementia in other diseases classified elsewhere without behavioral disturbance: Secondary | ICD-10-CM | POA: Insufficient documentation

## 2013-03-20 DIAGNOSIS — I10 Essential (primary) hypertension: Secondary | ICD-10-CM | POA: Diagnosis not present

## 2013-03-20 DIAGNOSIS — E7800 Pure hypercholesterolemia, unspecified: Secondary | ICD-10-CM | POA: Diagnosis not present

## 2013-03-20 DIAGNOSIS — R21 Rash and other nonspecific skin eruption: Secondary | ICD-10-CM | POA: Diagnosis not present

## 2013-03-20 DIAGNOSIS — L0231 Cutaneous abscess of buttock: Secondary | ICD-10-CM | POA: Insufficient documentation

## 2013-03-20 DIAGNOSIS — Z794 Long term (current) use of insulin: Secondary | ICD-10-CM | POA: Diagnosis not present

## 2013-03-20 DIAGNOSIS — K219 Gastro-esophageal reflux disease without esophagitis: Secondary | ICD-10-CM | POA: Insufficient documentation

## 2013-03-20 HISTORY — DX: Alzheimer's disease, unspecified: G30.9

## 2013-03-20 HISTORY — DX: Gastro-esophageal reflux disease without esophagitis: K21.9

## 2013-03-20 HISTORY — DX: Essential (primary) hypertension: I10

## 2013-03-20 HISTORY — DX: Type 2 diabetes mellitus without complications: E11.9

## 2013-03-20 HISTORY — DX: Dementia in other diseases classified elsewhere, unspecified severity, without behavioral disturbance, psychotic disturbance, mood disturbance, and anxiety: F02.80

## 2013-03-20 HISTORY — DX: Pure hypercholesterolemia, unspecified: E78.00

## 2013-03-20 MED ORDER — CLINDAMYCIN HCL 300 MG PO CAPS: 300.0000 mg | ORAL_CAPSULE | Freq: Four times a day (QID) | ORAL | Status: DC

## 2013-03-20 MED ORDER — LIDOCAINE HCL (PF) 1 % IJ SOLN
INTRAMUSCULAR | Status: AC
  Filled 2013-03-20: qty 5

## 2013-03-20 MED ORDER — CLINDAMYCIN HCL 150 MG PO CAPS
300.0000 mg | ORAL_CAPSULE | Freq: Once | ORAL | Status: AC
  Administered 2013-03-20: 300 mg via ORAL
  Filled 2013-03-20: qty 2

## 2013-03-20 NOTE — ED Provider Notes (Signed)
CSN: [id number]     Arrival date & time 03/20/13  1335 History     First MD Initiated Contact with Patient 03/20/13 1401     Chief Complaint  Patient presents with  . Abscess   (Consider location/radiation/quality/duration/timing/severity/associated sxs/prior Treatment) HPI Pt with history of dementia presents with mass to upper buttock found by caretaker today of unknown duration. Level V caveat due to dementia. No fever chills, N/V per caretaker. +purulent drainage.  Past Medical History  Diagnosis Date  . High cholesterol   . GERD (gastroesophageal reflux disease)   . Hypertension   . Alzheimer disease   . Diabetes mellitus without complication    History reviewed. No pertinent past surgical history. No family history on file. History  Substance Use Topics  . Smoking status: Current Every Day Smoker  . Smokeless tobacco: Not on file  . Alcohol Use: No    Review of Systems  Unable to perform ROS Constitutional: Negative for fever and chills.  Skin: Positive for rash.    Allergies  Review of patient's allergies indicates no known allergies.  Home Medications   Current Outpatient Rx  Name  Route  Sig  Dispense  Refill  . bimatoprost (LUMIGAN) 0.01 % SOLN   Both Eyes   Place 1 drop into both eyes at bedtime.         . carbamazepine (TEGRETOL) 200 MG tablet   Oral   Take 200 mg by mouth 3 (three) times daily.         . clonazePAM (KLONOPIN) 0.5 MG tablet   Oral   Take 0.5 mg by mouth 3 (three) times daily.         Marland Kitchen donepezil (ARICEPT) 10 MG tablet   Oral   Take 10 mg by mouth at bedtime.         . folic acid (FOLVITE) 1 MG tablet   Oral   Take 1 mg by mouth daily.         . insulin detemir (LEVEMIR) 100 UNIT/ML injection   Subcutaneous   Inject 16 Units into the skin daily.         . memantine (NAMENDA) 10 MG tablet   Oral   Take 10 mg by mouth 2 (two) times daily.         Marland Kitchen OLANZapine (ZYPREXA) 10 MG tablet   Oral   Take 10 mg by  mouth at bedtime.         Marland Kitchen omeprazole (PRILOSEC) 20 MG capsule   Oral   Take 20 mg by mouth daily.         Marland Kitchen oxybutynin (DITROPAN) 5 MG tablet   Oral   Take 5 mg by mouth 2 (two) times daily.         . QUEtiapine (SEROQUEL) 25 MG tablet   Oral   Take 25 mg by mouth at bedtime.         . simvastatin (ZOCOR) 40 MG tablet   Oral   Take 40 mg by mouth every evening.         . tamsulosin (FLOMAX) 0.4 MG CAPS   Oral   Take 0.4 mg by mouth daily.         Marland Kitchen acetaminophen (TYLENOL) 325 MG tablet   Oral   Take 650 mg by mouth every 4 (four) hours as needed for pain.         . clindamycin (CLEOCIN) 300 MG capsule   Oral   Take 1 capsule (  300 mg total) by mouth 4 (four) times daily. X 7 days   28 capsule   0    BP 152/110  Pulse 76  Temp(Src) 98.2 F (36.8 C) (Oral)  Resp 20  Wt 185 lb 5 oz (84.057 kg)  SpO2 100% Physical Exam  Nursing note and vitals reviewed. Constitutional: He appears well-developed and well-nourished. No distress.  confused  HENT:  Head: Normocephalic and atraumatic.  Mouth/Throat: Oropharynx is clear and moist.  Eyes: EOM are normal. Pupils are equal, round, and reactive to light.  Neck: Normal range of motion. Neck supple.  Cardiovascular: Normal rate and regular rhythm.   Pulmonary/Chest: Effort normal and breath sounds normal. No respiratory distress. He has no wheezes. He has no rales.  Abdominal: Soft. Bowel sounds are normal. He exhibits no distension and no mass. There is no tenderness. There is no rebound and no guarding.  Musculoskeletal: Normal range of motion. He exhibits no edema and no tenderness.  Neurological: He is alert.  Pt follows simple commands. No focal weakness.   Skin: Skin is warm and dry. No rash noted. There is erythema.  Draining abscess to superior intragluteal cleft with purulent drainage. No rectal masses or drainage.   Psychiatric: He has a normal mood and affect. His behavior is normal.    ED Course    INCISION AND DRAINAGE Date/Time: 03/20/2013 3:12 PM Performed by: Loren Racer Authorized by: Ranae Palms, Camarion Weier Type: abscess Body area: trunk Location details: back Local anesthetic: lidocaine 1% without epinephrine Anesthetic total: 3 ml Patient sedated: no Scalpel size: 11 Incision type: single straight Complexity: simple Drainage: purulent Drainage amount: scant Wound treatment: wound left open Packing material: none Patient tolerance: Patient tolerated the procedure well with no immediate complications. Comments: Abscess to superior intragluteal cleft. No apparent communication with rectum.    (including critical care time)  Labs Reviewed - No data to display No results found. 1. Abscess of buttock     MDM  Caretaker advised to bring back in 48 hours to re-assess and sooner if he has increased swelling/redness to area, fever or any concerns.   Loren Racer, MD 03/20/13 1515

## 2013-03-20 NOTE — ED Notes (Addendum)
Pt caregiver from [facility] reports abscess to pt buttock.

## 2013-03-23 DIAGNOSIS — L0231 Cutaneous abscess of buttock: Secondary | ICD-10-CM | POA: Diagnosis not present

## 2013-03-23 DIAGNOSIS — F209 Schizophrenia, unspecified: Secondary | ICD-10-CM | POA: Diagnosis not present

## 2013-03-23 DIAGNOSIS — J449 Chronic obstructive pulmonary disease, unspecified: Secondary | ICD-10-CM | POA: Diagnosis not present

## 2013-03-23 DIAGNOSIS — E109 Type 1 diabetes mellitus without complications: Secondary | ICD-10-CM | POA: Diagnosis not present

## 2013-03-23 DIAGNOSIS — L03317 Cellulitis of buttock: Secondary | ICD-10-CM | POA: Diagnosis not present

## 2013-04-09 DIAGNOSIS — E1149 Type 2 diabetes mellitus with other diabetic neurological complication: Secondary | ICD-10-CM | POA: Diagnosis not present

## 2013-04-09 DIAGNOSIS — E119 Type 2 diabetes mellitus without complications: Secondary | ICD-10-CM | POA: Diagnosis not present

## 2013-06-18 DIAGNOSIS — L851 Acquired keratosis [keratoderma] palmaris et plantaris: Secondary | ICD-10-CM | POA: Diagnosis not present

## 2013-06-18 DIAGNOSIS — E1149 Type 2 diabetes mellitus with other diabetic neurological complication: Secondary | ICD-10-CM | POA: Diagnosis not present

## 2013-06-18 DIAGNOSIS — B351 Tinea unguium: Secondary | ICD-10-CM | POA: Diagnosis not present

## 2013-06-30 DIAGNOSIS — E119 Type 2 diabetes mellitus without complications: Secondary | ICD-10-CM | POA: Diagnosis not present

## 2013-06-30 DIAGNOSIS — H409 Unspecified glaucoma: Secondary | ICD-10-CM | POA: Diagnosis not present

## 2013-06-30 DIAGNOSIS — Z794 Long term (current) use of insulin: Secondary | ICD-10-CM | POA: Diagnosis not present

## 2013-06-30 DIAGNOSIS — H2510 Age-related nuclear cataract, unspecified eye: Secondary | ICD-10-CM | POA: Diagnosis not present

## 2013-08-27 DIAGNOSIS — L851 Acquired keratosis [keratoderma] palmaris et plantaris: Secondary | ICD-10-CM | POA: Diagnosis not present

## 2013-08-27 DIAGNOSIS — B351 Tinea unguium: Secondary | ICD-10-CM | POA: Diagnosis not present

## 2013-08-27 DIAGNOSIS — E1149 Type 2 diabetes mellitus with other diabetic neurological complication: Secondary | ICD-10-CM | POA: Diagnosis not present

## 2013-10-04 DIAGNOSIS — R4189 Other symptoms and signs involving cognitive functions and awareness: Secondary | ICD-10-CM | POA: Diagnosis not present

## 2013-11-17 DIAGNOSIS — E109 Type 1 diabetes mellitus without complications: Secondary | ICD-10-CM | POA: Diagnosis not present

## 2013-11-17 DIAGNOSIS — J449 Chronic obstructive pulmonary disease, unspecified: Secondary | ICD-10-CM | POA: Diagnosis not present

## 2013-11-17 DIAGNOSIS — F489 Nonpsychotic mental disorder, unspecified: Secondary | ICD-10-CM | POA: Diagnosis not present

## 2013-11-17 DIAGNOSIS — I10 Essential (primary) hypertension: Secondary | ICD-10-CM | POA: Diagnosis not present

## 2013-12-07 DIAGNOSIS — L851 Acquired keratosis [keratoderma] palmaris et plantaris: Secondary | ICD-10-CM | POA: Diagnosis not present

## 2013-12-07 DIAGNOSIS — B351 Tinea unguium: Secondary | ICD-10-CM | POA: Diagnosis not present

## 2013-12-25 ENCOUNTER — Encounter (HOSPITAL_COMMUNITY): Payer: Self-pay | Admitting: Emergency Medicine

## 2013-12-25 ENCOUNTER — Emergency Department (HOSPITAL_COMMUNITY): Payer: Medicare Other

## 2013-12-25 ENCOUNTER — Emergency Department (HOSPITAL_COMMUNITY)
Admission: EM | Admit: 2013-12-25 | Discharge: 2013-12-25 | Disposition: A | Discharge status: Home / Self Care | Payer: Medicare Other | Attending: Emergency Medicine | Admitting: Emergency Medicine

## 2013-12-25 DIAGNOSIS — R109 Unspecified abdominal pain: Secondary | ICD-10-CM | POA: Insufficient documentation

## 2013-12-25 DIAGNOSIS — E7800 Pure hypercholesterolemia, unspecified: Secondary | ICD-10-CM | POA: Insufficient documentation

## 2013-12-25 DIAGNOSIS — E119 Type 2 diabetes mellitus without complications: Secondary | ICD-10-CM | POA: Diagnosis not present

## 2013-12-25 DIAGNOSIS — M79609 Pain in unspecified limb: Secondary | ICD-10-CM | POA: Diagnosis not present

## 2013-12-25 DIAGNOSIS — N281 Cyst of kidney, acquired: Secondary | ICD-10-CM | POA: Diagnosis not present

## 2013-12-25 DIAGNOSIS — Z79899 Other long term (current) drug therapy: Secondary | ICD-10-CM | POA: Diagnosis not present

## 2013-12-25 DIAGNOSIS — M7989 Other specified soft tissue disorders: Secondary | ICD-10-CM | POA: Insufficient documentation

## 2013-12-25 DIAGNOSIS — I10 Essential (primary) hypertension: Secondary | ICD-10-CM | POA: Diagnosis not present

## 2013-12-25 DIAGNOSIS — Z87448 Personal history of other diseases of urinary system: Secondary | ICD-10-CM | POA: Diagnosis not present

## 2013-12-25 DIAGNOSIS — F0280 Dementia in other diseases classified elsewhere without behavioral disturbance: Secondary | ICD-10-CM | POA: Insufficient documentation

## 2013-12-25 DIAGNOSIS — G309 Alzheimer's disease, unspecified: Secondary | ICD-10-CM | POA: Insufficient documentation

## 2013-12-25 DIAGNOSIS — K219 Gastro-esophageal reflux disease without esophagitis: Secondary | ICD-10-CM | POA: Diagnosis not present

## 2013-12-25 DIAGNOSIS — F17200 Nicotine dependence, unspecified, uncomplicated: Secondary | ICD-10-CM | POA: Diagnosis not present

## 2013-12-25 DIAGNOSIS — Z792 Long term (current) use of antibiotics: Secondary | ICD-10-CM | POA: Diagnosis not present

## 2013-12-25 DIAGNOSIS — F209 Schizophrenia, unspecified: Secondary | ICD-10-CM | POA: Diagnosis not present

## 2013-12-25 HISTORY — DX: Overactive bladder: N32.81

## 2013-12-25 HISTORY — DX: Unspecified dementia, unspecified severity, without behavioral disturbance, psychotic disturbance, mood disturbance, and anxiety: F03.90

## 2013-12-25 HISTORY — DX: Schizophrenia, unspecified: F20.9

## 2013-12-25 LAB — CBC WITH DIFFERENTIAL/PLATELET
Basophils Absolute: 0 10*3/uL (ref 0.0–0.1)
Basophils Relative: 0 % (ref 0–1)
Eosinophils Absolute: 0.1 10*3/uL (ref 0.0–0.7)
Eosinophils Relative: 1 % (ref 0–5)
HCT: 39.2 % (ref 39.0–52.0)
Hemoglobin: 13.1 g/dL (ref 13.0–17.0)
Lymphocytes Relative: 46 % (ref 12–46)
Lymphs Abs: 2.6 10*3/uL (ref 0.7–4.0)
MCH: 35.9 pg — ABNORMAL HIGH (ref 26.0–34.0)
MCHC: 33.4 g/dL (ref 30.0–36.0)
MCV: 107.4 fL — ABNORMAL HIGH (ref 78.0–100.0)
Monocytes Absolute: 0.5 10*3/uL (ref 0.1–1.0)
Monocytes Relative: 9 % (ref 3–12)
Neutro Abs: 2.5 10*3/uL (ref 1.7–7.7)
Neutrophils Relative %: 44 % (ref 43–77)
Platelets: 247 10*3/uL (ref 150–400)
RBC: 3.65 MIL/uL — ABNORMAL LOW (ref 4.22–5.81)
RDW: 14.4 % (ref 11.5–15.5)
WBC: 5.7 10*3/uL (ref 4.0–10.5)

## 2013-12-25 LAB — URINALYSIS, ROUTINE W REFLEX MICROSCOPIC
Bilirubin Urine: NEGATIVE
Glucose, UA: NEGATIVE mg/dL
Hgb urine dipstick: NEGATIVE
Ketones, ur: NEGATIVE mg/dL
Leukocytes, UA: NEGATIVE
Nitrite: NEGATIVE
Protein, ur: NEGATIVE mg/dL
Specific Gravity, Urine: <1.005 — ABNORMAL LOW (ref 1.005–1.030)
Urobilinogen, UA: 0.2 mg/dL (ref 0.0–1.0)
pH: 6.5 (ref 5.0–8.0)

## 2013-12-25 LAB — COMPREHENSIVE METABOLIC PANEL
ALT: 25 U/L (ref 0–53)
AST: 24 U/L (ref 0–37)
Albumin: 3.3 g/dL — ABNORMAL LOW (ref 3.5–5.2)
Alkaline Phosphatase: 105 U/L (ref 39–117)
BUN: 18 mg/dL (ref 6–23)
CO2: 29 mEq/L (ref 19–32)
Calcium: 9.2 mg/dL (ref 8.4–10.5)
Chloride: 104 mEq/L (ref 96–112)
Creatinine, Ser: 0.81 mg/dL (ref 0.50–1.35)
GFR calc Af Amer: >90 mL/min (ref >90)
GFR calc non Af Amer: 87 mL/min — ABNORMAL LOW (ref >90)
Glucose, Bld: 156 mg/dL — ABNORMAL HIGH (ref 70–99)
Potassium: 4.4 mEq/L (ref 3.7–5.3)
Sodium: 142 mEq/L (ref 137–147)
Total Bilirubin: 0.2 mg/dL — ABNORMAL LOW (ref 0.3–1.2)
Total Protein: 6.6 g/dL (ref 6.0–8.3)

## 2013-12-25 MED ORDER — IOHEXOL 300 MG/ML  SOLN
50.0000 mL | Freq: Once | INTRAMUSCULAR | Status: AC | PRN
  Administered 2013-12-25: 50 mL via ORAL

## 2013-12-25 MED ORDER — IOHEXOL 300 MG/ML  SOLN
100.0000 mL | Freq: Once | INTRAMUSCULAR | Status: AC | PRN
  Administered 2013-12-25: 100 mL via INTRAVENOUS

## 2013-12-25 NOTE — ED Notes (Signed)
Pt presents to er with  [facility], caregiver states that she noticed pt's left lower leg was swollen this am, caregiver from last night states that pt's left ankle was swollen last night, denies any injury, caregiver also reports that pt was not as "active" as usually this am, after arrival to er, pt talking, confused, but is "normal" per caregiver,

## 2013-12-25 NOTE — ED Notes (Signed)
Pt pointed to left chest when asked if he is having any pain but patient denied verbally to pain in the chest.

## 2013-12-25 NOTE — ED Notes (Signed)
Pt and caregiver updated on plan of care, caregiver provided coffee per her request,

## 2013-12-25 NOTE — ED Notes (Signed)
Pt is getting dressed with the help of the caretaker. Andre Rowe

## 2013-12-25 NOTE — Discharge Instructions (Signed)
Edema Edema is an abnormal build-up of fluids in tissues. Because this is partly dependent on gravity (water flows to the lowest place), it is more common in the legs and thighs (lower extremities). It is also common in the looser tissues, like around the eyes. Painless swelling of the feet and ankles is common and increases as a person ages. It may affect both legs and may include the calves or even thighs. When squeezed, the fluid may move out of the affected area and may leave a dent for a few moments. CAUSES   Prolonged standing or sitting in one place for extended periods of time. Movement helps pump tissue fluid into the veins, and absence of movement prevents this, resulting in edema.  Varicose veins. The valves in the veins do not work as well as they should. This causes fluid to leak into the tissues.  Fluid and salt overload.  Injury, burn, or surgery to the leg, ankle, or foot, may damage veins and allow fluid to leak out.  Sunburn damages vessels. Leaky vessels allow fluid to go out into the sunburned tissues.  Allergies (from insect bites or stings, medications or chemicals) cause swelling by allowing vessels to become leaky.  Protein in the blood helps keep fluid in your vessels. Low protein, as in malnutrition, allows fluid to leak out.  Hormonal changes, including pregnancy and menstruation, cause fluid retention. This fluid may leak out of vessels and cause edema.  Medications that cause fluid retention. Examples are sex hormones, blood pressure medications, steroid treatment, or anti-depressants.  Some illnesses cause edema, especially heart failure, kidney disease, or liver disease.  Surgery that cuts veins or lymph nodes, such as surgery done for the heart or for breast cancer, may result in edema. DIAGNOSIS  Your caregiver is usually easily able to determine what is causing your swelling (edema) by simply asking what is wrong (getting a history) and examining you (doing  a physical). Sometimes x-rays, EKG (electrocardiogram or heart tracing), and blood work may be done to evaluate for underlying medical illness. TREATMENT  General treatment includes:  Leg elevation (or elevation of the affected body part).  Restriction of fluid intake.  Prevention of fluid overload.  Compression of the affected body part. Compression with elastic bandages or support stockings squeezes the tissues, preventing fluid from entering and forcing it back into the blood vessels.  Diuretics (also called water pills or fluid pills) pull fluid out of your body in the form of increased urination. These are effective in reducing the swelling, but can have side effects and must be used only under your caregiver's supervision. Diuretics are appropriate only for some types of edema. The specific treatment can be directed at any underlying causes discovered. Heart, liver, or kidney disease should be treated appropriately. HOME CARE INSTRUCTIONS   Elevate the legs (or affected body part) above the level of the heart, while lying down.  Avoid sitting or standing still for prolonged periods of time.  Avoid putting anything directly under the knees when lying down, and do not wear constricting clothing or garters on the upper legs.  Exercising the legs causes the fluid to work back into the veins and lymphatic channels. This may help the swelling go down.  The pressure applied by elastic bandages or support stockings can help reduce ankle swelling.  A low-salt diet may help reduce fluid retention and decrease the ankle swelling.  Take any medications exactly as prescribed. SEEK MEDICAL CARE IF:  Your edema is   not responding to recommended treatments. SEEK IMMEDIATE MEDICAL CARE IF:   You develop shortness of breath or chest pain.  You cannot breathe when you lay down; or if, while lying down, you have to get up and go to the window to get your breath.  You are having increasing  swelling without relief from treatment.  You develop a fever over 102 F (38.9 C).  You develop pain or redness in the areas that are swollen.  Tell your caregiver right away if you have gained 03 lb/1.4 kg in 1 day or 05 lb/2.3 kg in a week. MAKE SURE YOU:   Understand these instructions.  Will watch your condition.  Will get help right away if you are not doing well or get worse. Document Released: 08/05/2005 Document Revised: 02/04/2012 Document Reviewed: 03/23/2008 [employer] Patient Information 2014 [employer].  

## 2013-12-25 NOTE — ED Notes (Signed)
I and O cath performed, pt tolerated well, pt had been incontinent of urine as well, pt, bed linen and gown changed, pt and caregiver at bedside updated on plan of care,

## 2013-12-25 NOTE — ED Notes (Signed)
Resident of [facility]. Pt sent to ED today due to left lower extremity swelling and also states pt has been c/o abdominal pain. Also states pt is lethargic compared to usual.

## 2013-12-25 NOTE — ED Provider Notes (Signed)
CSN: [id number]     Arrival date & time 12/25/13  1207 History  This chart was scribed for Andre RazorStephen Kirby Argueta, MD by Luisa DagoPriscilla Tutu, ED Scribe. This patient was seen in room APA04/APA04 and the patient's care was started at 1:13 PM.    Chief Complaint  Patient presents with  . Leg Swelling   The history is provided by the patient and a caregiver. No language interpreter was used.   HPI Comments: Andre Rowe is a 73 y.o. male who presents to the Emergency Department complaining of abdominal pain that started today. Caregiver states that after giving him his bath she noticed that pt's left ankle was swollen. She states that the last time she saw him was a week ago and they looked normal. Caregiver states that she thought that the swelling may have been caused by a bug bite, but she didn't find a any on his body when she looked. Pt states that his stomach feels like it has "nothing in it".  She states that his stomach seems bigger than what it usually looks like. Caregiver believes that the pt may be constipated. He was given some stool softness, but they did not work well.   Past Medical History  Diagnosis Date  . High cholesterol   . GERD (gastroesophageal reflux disease)   . Hypertension   . Alzheimer disease   . Diabetes mellitus without complication   . Schizophrenia   . Dementia   . Overactive bladder    Past Surgical History  Procedure Laterality Date  . Unable     No family history on file. History  Substance Use Topics  . Smoking status: Current Every Day Smoker  . Smokeless tobacco: Not on file  . Alcohol Use: No    Review of Systems  Gastrointestinal: Positive for abdominal pain.  Musculoskeletal: Positive for joint swelling (left ankle swelling).  All other systems reviewed and are negative.     Allergies  Review of patient's allergies indicates no known allergies.  Home Medications   Prior to Admission medications   Medication Sig Start Date End Date Taking?  Authorizing Provider  acetaminophen (TYLENOL) 325 MG tablet Take 650 mg by mouth every 4 (four) hours as needed for pain.    Historical Provider, MD  bimatoprost (LUMIGAN) 0.01 % SOLN Place 1 drop into both eyes at bedtime.    Historical Provider, MD  carbamazepine (TEGRETOL) 200 MG tablet Take 200 mg by mouth 3 (three) times daily.    Historical Provider, MD  clindamycin (CLEOCIN) 300 MG capsule Take 1 capsule (300 mg total) by mouth 4 (four) times daily. X 7 days 03/20/13   Loren Raceravid Yelverton, MD  clonazePAM (KLONOPIN) 0.5 MG tablet Take 0.5 mg by mouth 3 (three) times daily.    Historical Provider, MD  donepezil (ARICEPT) 10 MG tablet Take 10 mg by mouth at bedtime.    Historical Provider, MD  folic acid (FOLVITE) 1 MG tablet Take 1 mg by mouth daily.    Historical Provider, MD  insulin detemir (LEVEMIR) 100 UNIT/ML injection Inject 16 Units into the skin daily.    Historical Provider, MD  memantine (NAMENDA) 10 MG tablet Take 10 mg by mouth 2 (two) times daily.    Historical Provider, MD  OLANZapine (ZYPREXA) 10 MG tablet Take 10 mg by mouth at bedtime.    Historical Provider, MD  omeprazole (PRILOSEC) 20 MG capsule Take 20 mg by mouth daily.    Historical Provider, MD  oxybutynin (DITROPAN) 5 MG tablet  Take 5 mg by mouth 2 (two) times daily.    Historical Provider, MD  QUEtiapine (SEROQUEL) 25 MG tablet Take 25 mg by mouth at bedtime.    Historical Provider, MD  simvastatin (ZOCOR) 40 MG tablet Take 40 mg by mouth every evening.    Historical Provider, MD  tamsulosin (FLOMAX) 0.4 MG CAPS Take 0.4 mg by mouth daily.    Historical Provider, MD   BP 121/79  Pulse 97  Temp(Src) 98 F (36.7 C) (Oral)  Resp 20  SpO2 94%  Physical Exam  Nursing note and vitals reviewed. Constitutional: He appears well-developed and well-nourished. No distress.  HENT:  Head: Normocephalic and atraumatic.  Eyes: Conjunctivae are normal. Right eye exhibits no discharge. Left eye exhibits no discharge.  Neck: Neck  supple.  Cardiovascular: Normal rate, regular rhythm and normal heart sounds.  Exam reveals no gallop and no friction rub.   No murmur heard. Pulmonary/Chest: Effort normal and breath sounds normal. No respiratory distress.  Abdominal: Soft. He exhibits distension. There is no tenderness.  Musculoskeletal: He exhibits no edema and no tenderness.  Pitting left lower extremity edema extending to the left hip. Palpable DP pulse. No apparent pain with ROM of the left knee or ankle.   Neurological: He is alert.  Skin: Skin is warm and dry.  Psychiatric: He has a normal mood and affect. His behavior is normal. Thought content normal.  Pleasantly confused.    ED Course  Procedures (including critical care time)  DIAGNOSTIC STUDIES: Oxygen Saturation is 94% on RA, low by my interpretation.    COORDINATION OF CARE: 1:17 PM- Pt advised of plan for treatment and pt agrees.  Labs Review Labs Reviewed  CBC WITH DIFFERENTIAL - Abnormal; Notable for the following:    RBC 3.65 (*)    MCV 107.4 (*)    MCH 35.9 (*)    All other components within normal limits  COMPREHENSIVE METABOLIC PANEL - Abnormal; Notable for the following:    Glucose, Bld 156 (*)    Albumin 3.3 (*)    Total Bilirubin 0.2 (*)    GFR calc non Af Amer 87 (*)    All other components within normal limits  URINALYSIS, ROUTINE W REFLEX MICROSCOPIC    Imaging Review Ct Abdomen Pelvis W Contrast  12/25/2013   CLINICAL DATA:  Abdominal pain and distention  EXAM: CT ABDOMEN AND PELVIS WITH CONTRAST  TECHNIQUE: Multidetector CT imaging of the abdomen and pelvis was performed using the standard protocol following bolus administration of intravenous contrast.  CONTRAST:  50mL OMNIPAQUE IOHEXOL 300 MG/ML SOLN, 100mL OMNIPAQUE IOHEXOL 300 MG/ML SOLN  COMPARISON:  US RENAL dated 03/01/2009; CT CHEST W/CM dated 02/13/2009  FINDINGS: Bullous changes are appreciated within the lung bases as well as mild atelectasis versus scarring.  The liver,  spleen, right adrenal, pancreas are unremarkable. Stable 1.6 cm left adrenal nodule consistent with a benign adenoma.  Multiple nonenhancing low attenuating foci within the kidneys. The dominant area in the upper pole of the right kidney is stable as well as the dominant cyst lower pole left kidney. An indeterminate 13 mm dense nodule lower pole right kidney. The kidneys are otherwise unremarkable. No abdominal aortic aneurysm.  The celiac, SMA, IMA, portal vein, SMV are opacified. Atherosclerotic calcifications are appreciated within the abdominal aorta the valves negative.  The bowel is otherwise negative. Calcified gallstones within the gallbladder. Moderate amount of stool within the colon.  There are no aggressive appearing osseous lesions. A very small  fat containing umbilical hernia is appreciated. No inguinal hernia.  IMPRESSION: Indeterminate 13 mm nodule lower pole right kidney. Statistically this is likely a high density benign cyst. Further characterization with dedicated renal ultrasound is recommended.  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 Multiple sub cm low attenuating foci scattered throughout the right left kidneys the visualized nodules on the prior study are stable. Remaining nodules statistically likely represent small cysts.  Bullous changes and scarring within the lung bases.  Stable benign left adrenal adenoma  Otherwise no further evidence of abdominal or pelvic pathology.   Electronically Signed   By: Salome Holmes M.D.   On: 12/25/2013 15:19     EKG Interpretation   Date/Time:  Saturday Dec 25 2013 12:41:41 EDT Ventricular Rate:  88 PR Interval:  215 QRS Duration: 96 QT Interval:  365 QTC Calculation: 442 R Axis:   -70 Text Interpretation:  Sinus rhythm Borderline prolonged PR interval Left  anterior fascicular block Abnormal R-wave progression, late transition ED  PHYSICIAN INTERPRETATION AVAILABLE IN [facility] Confirmed by  TEST,  Record ([id number]) on 12/27/2013 7:19:23 AM      MDM   Final diagnoses:  Left leg swelling  Abdominal pain       Andre Razor, MD 12/30/13 [phone]

## 2014-01-27 DIAGNOSIS — F259 Schizoaffective disorder, unspecified: Secondary | ICD-10-CM | POA: Diagnosis not present

## 2014-02-15 DIAGNOSIS — B351 Tinea unguium: Secondary | ICD-10-CM | POA: Diagnosis not present

## 2014-02-15 DIAGNOSIS — L851 Acquired keratosis [keratoderma] palmaris et plantaris: Secondary | ICD-10-CM | POA: Diagnosis not present

## 2014-02-15 DIAGNOSIS — E1149 Type 2 diabetes mellitus with other diabetic neurological complication: Secondary | ICD-10-CM | POA: Diagnosis not present

## 2014-02-16 DIAGNOSIS — E109 Type 1 diabetes mellitus without complications: Secondary | ICD-10-CM | POA: Diagnosis not present

## 2014-02-16 DIAGNOSIS — F17200 Nicotine dependence, unspecified, uncomplicated: Secondary | ICD-10-CM | POA: Diagnosis not present

## 2014-02-16 DIAGNOSIS — F489 Nonpsychotic mental disorder, unspecified: Secondary | ICD-10-CM | POA: Diagnosis not present

## 2014-02-16 DIAGNOSIS — J449 Chronic obstructive pulmonary disease, unspecified: Secondary | ICD-10-CM | POA: Diagnosis not present

## 2014-02-25 DIAGNOSIS — IMO0001 Reserved for inherently not codable concepts without codable children: Secondary | ICD-10-CM | POA: Diagnosis not present

## 2014-04-26 DIAGNOSIS — L851 Acquired keratosis [keratoderma] palmaris et plantaris: Secondary | ICD-10-CM | POA: Diagnosis not present

## 2014-04-26 DIAGNOSIS — E1149 Type 2 diabetes mellitus with other diabetic neurological complication: Secondary | ICD-10-CM | POA: Diagnosis not present

## 2014-04-26 DIAGNOSIS — B351 Tinea unguium: Secondary | ICD-10-CM | POA: Diagnosis not present

## 2014-05-18 DIAGNOSIS — R4189 Other symptoms and signs involving cognitive functions and awareness: Secondary | ICD-10-CM | POA: Diagnosis not present

## 2014-07-05 DIAGNOSIS — L851 Acquired keratosis [keratoderma] palmaris et plantaris: Secondary | ICD-10-CM | POA: Diagnosis not present

## 2014-07-05 DIAGNOSIS — B351 Tinea unguium: Secondary | ICD-10-CM | POA: Diagnosis not present

## 2014-07-05 DIAGNOSIS — E1342 Other specified diabetes mellitus with diabetic polyneuropathy: Secondary | ICD-10-CM | POA: Diagnosis not present

## 2014-07-06 DIAGNOSIS — E119 Type 2 diabetes mellitus without complications: Secondary | ICD-10-CM | POA: Diagnosis not present

## 2014-07-06 DIAGNOSIS — H2513 Age-related nuclear cataract, bilateral: Secondary | ICD-10-CM | POA: Diagnosis not present

## 2014-08-08 DIAGNOSIS — N138 Other obstructive and reflux uropathy: Secondary | ICD-10-CM | POA: Diagnosis not present

## 2014-08-08 DIAGNOSIS — J449 Chronic obstructive pulmonary disease, unspecified: Secondary | ICD-10-CM | POA: Diagnosis not present

## 2014-08-08 DIAGNOSIS — E1165 Type 2 diabetes mellitus with hyperglycemia: Secondary | ICD-10-CM | POA: Diagnosis not present

## 2014-08-08 DIAGNOSIS — F489 Nonpsychotic mental disorder, unspecified: Secondary | ICD-10-CM | POA: Diagnosis not present

## 2014-09-01 DIAGNOSIS — F489 Nonpsychotic mental disorder, unspecified: Secondary | ICD-10-CM | POA: Diagnosis not present

## 2014-09-01 DIAGNOSIS — J449 Chronic obstructive pulmonary disease, unspecified: Secondary | ICD-10-CM | POA: Diagnosis not present

## 2014-09-01 DIAGNOSIS — E1165 Type 2 diabetes mellitus with hyperglycemia: Secondary | ICD-10-CM | POA: Diagnosis not present

## 2014-09-01 DIAGNOSIS — R0602 Shortness of breath: Secondary | ICD-10-CM | POA: Diagnosis not present

## 2014-09-05 ENCOUNTER — Other Ambulatory Visit (HOSPITAL_COMMUNITY): Payer: Self-pay | Admitting: Pulmonary Disease

## 2014-09-05 ENCOUNTER — Ambulatory Visit (HOSPITAL_COMMUNITY)
Admission: RE | Admit: 2014-09-05 | Discharge: 2014-09-05 | Disposition: A | Discharge status: Home / Self Care | Payer: Medicare Other | Source: Ambulatory Visit | Attending: Pulmonary Disease | Admitting: Pulmonary Disease

## 2014-09-05 DIAGNOSIS — J439 Emphysema, unspecified: Secondary | ICD-10-CM | POA: Diagnosis not present

## 2014-09-05 DIAGNOSIS — J9811 Atelectasis: Secondary | ICD-10-CM | POA: Diagnosis not present

## 2014-09-05 DIAGNOSIS — F489 Nonpsychotic mental disorder, unspecified: Secondary | ICD-10-CM | POA: Diagnosis not present

## 2014-09-05 DIAGNOSIS — E1165 Type 2 diabetes mellitus with hyperglycemia: Secondary | ICD-10-CM | POA: Diagnosis not present

## 2014-09-05 DIAGNOSIS — R0989 Other specified symptoms and signs involving the circulatory and respiratory systems: Secondary | ICD-10-CM | POA: Diagnosis not present

## 2014-09-05 DIAGNOSIS — E782 Mixed hyperlipidemia: Secondary | ICD-10-CM | POA: Diagnosis not present

## 2014-09-05 DIAGNOSIS — N138 Other obstructive and reflux uropathy: Secondary | ICD-10-CM | POA: Diagnosis not present

## 2014-09-05 DIAGNOSIS — J449 Chronic obstructive pulmonary disease, unspecified: Secondary | ICD-10-CM

## 2014-09-05 DIAGNOSIS — R0602 Shortness of breath: Secondary | ICD-10-CM | POA: Diagnosis not present

## 2014-09-07 DIAGNOSIS — F250 Schizoaffective disorder, bipolar type: Secondary | ICD-10-CM | POA: Diagnosis not present

## 2014-09-13 DIAGNOSIS — B351 Tinea unguium: Secondary | ICD-10-CM | POA: Diagnosis not present

## 2014-09-13 DIAGNOSIS — L851 Acquired keratosis [keratoderma] palmaris et plantaris: Secondary | ICD-10-CM | POA: Diagnosis not present

## 2014-09-13 DIAGNOSIS — E1342 Other specified diabetes mellitus with diabetic polyneuropathy: Secondary | ICD-10-CM | POA: Diagnosis not present

## 2014-10-06 DIAGNOSIS — I10 Essential (primary) hypertension: Secondary | ICD-10-CM | POA: Diagnosis not present

## 2014-10-06 DIAGNOSIS — E782 Mixed hyperlipidemia: Secondary | ICD-10-CM | POA: Diagnosis not present

## 2014-10-06 DIAGNOSIS — E6609 Other obesity due to excess calories: Secondary | ICD-10-CM | POA: Diagnosis not present

## 2014-10-06 DIAGNOSIS — E1165 Type 2 diabetes mellitus with hyperglycemia: Secondary | ICD-10-CM | POA: Diagnosis not present

## 2014-10-08 DIAGNOSIS — I10 Essential (primary) hypertension: Secondary | ICD-10-CM | POA: Diagnosis not present

## 2014-10-08 DIAGNOSIS — E782 Mixed hyperlipidemia: Secondary | ICD-10-CM | POA: Diagnosis not present

## 2014-10-08 DIAGNOSIS — E1165 Type 2 diabetes mellitus with hyperglycemia: Secondary | ICD-10-CM | POA: Diagnosis not present

## 2014-10-08 DIAGNOSIS — E559 Vitamin D deficiency, unspecified: Secondary | ICD-10-CM | POA: Diagnosis not present

## 2014-10-08 DIAGNOSIS — Z713 Dietary counseling and surveillance: Secondary | ICD-10-CM | POA: Diagnosis not present

## 2014-10-13 DIAGNOSIS — I10 Essential (primary) hypertension: Secondary | ICD-10-CM | POA: Diagnosis not present

## 2014-10-13 DIAGNOSIS — E785 Hyperlipidemia, unspecified: Secondary | ICD-10-CM | POA: Diagnosis not present

## 2014-10-13 DIAGNOSIS — E1165 Type 2 diabetes mellitus with hyperglycemia: Secondary | ICD-10-CM | POA: Diagnosis not present

## 2014-10-13 DIAGNOSIS — E559 Vitamin D deficiency, unspecified: Secondary | ICD-10-CM | POA: Diagnosis not present

## 2014-10-13 DIAGNOSIS — E6609 Other obesity due to excess calories: Secondary | ICD-10-CM | POA: Diagnosis not present

## 2014-11-18 DIAGNOSIS — E785 Hyperlipidemia, unspecified: Secondary | ICD-10-CM | POA: Diagnosis not present

## 2014-11-18 DIAGNOSIS — E1165 Type 2 diabetes mellitus with hyperglycemia: Secondary | ICD-10-CM | POA: Diagnosis not present

## 2014-11-18 DIAGNOSIS — I10 Essential (primary) hypertension: Secondary | ICD-10-CM | POA: Diagnosis not present

## 2014-11-18 DIAGNOSIS — E6609 Other obesity due to excess calories: Secondary | ICD-10-CM | POA: Diagnosis not present

## 2014-11-22 DIAGNOSIS — E1342 Other specified diabetes mellitus with diabetic polyneuropathy: Secondary | ICD-10-CM | POA: Diagnosis not present

## 2014-11-22 DIAGNOSIS — L851 Acquired keratosis [keratoderma] palmaris et plantaris: Secondary | ICD-10-CM | POA: Diagnosis not present

## 2014-11-22 DIAGNOSIS — B351 Tinea unguium: Secondary | ICD-10-CM | POA: Diagnosis not present

## 2014-12-08 ENCOUNTER — Emergency Department (HOSPITAL_COMMUNITY): Payer: Medicare Other

## 2014-12-08 ENCOUNTER — Encounter (HOSPITAL_COMMUNITY): Payer: Self-pay | Admitting: Cardiology

## 2014-12-08 ENCOUNTER — Inpatient Hospital Stay (HOSPITAL_COMMUNITY)
Admission: EM | Admit: 2014-12-08 | Discharge: 2014-12-12 | DRG: 194 | Disposition: A | Discharge status: Skilled Nursing Facility | Payer: Medicare Other | Attending: Pulmonary Disease | Admitting: Pulmonary Disease

## 2014-12-08 DIAGNOSIS — J449 Chronic obstructive pulmonary disease, unspecified: Secondary | ICD-10-CM | POA: Diagnosis not present

## 2014-12-08 DIAGNOSIS — E1165 Type 2 diabetes mellitus with hyperglycemia: Secondary | ICD-10-CM | POA: Diagnosis not present

## 2014-12-08 DIAGNOSIS — G309 Alzheimer's disease, unspecified: Secondary | ICD-10-CM | POA: Diagnosis present

## 2014-12-08 DIAGNOSIS — K219 Gastro-esophageal reflux disease without esophagitis: Secondary | ICD-10-CM | POA: Diagnosis present

## 2014-12-08 DIAGNOSIS — R410 Disorientation, unspecified: Secondary | ICD-10-CM | POA: Diagnosis not present

## 2014-12-08 DIAGNOSIS — F17210 Nicotine dependence, cigarettes, uncomplicated: Secondary | ICD-10-CM | POA: Diagnosis present

## 2014-12-08 DIAGNOSIS — L03116 Cellulitis of left lower limb: Secondary | ICD-10-CM | POA: Diagnosis present

## 2014-12-08 DIAGNOSIS — M545 Low back pain: Secondary | ICD-10-CM | POA: Diagnosis not present

## 2014-12-08 DIAGNOSIS — K8020 Calculus of gallbladder without cholecystitis without obstruction: Secondary | ICD-10-CM | POA: Diagnosis not present

## 2014-12-08 DIAGNOSIS — E780 Pure hypercholesterolemia: Secondary | ICD-10-CM | POA: Diagnosis present

## 2014-12-08 DIAGNOSIS — R0602 Shortness of breath: Secondary | ICD-10-CM | POA: Diagnosis not present

## 2014-12-08 DIAGNOSIS — E119 Type 2 diabetes mellitus without complications: Secondary | ICD-10-CM | POA: Diagnosis present

## 2014-12-08 DIAGNOSIS — M7989 Other specified soft tissue disorders: Secondary | ICD-10-CM

## 2014-12-08 DIAGNOSIS — J189 Pneumonia, unspecified organism: Secondary | ICD-10-CM | POA: Diagnosis not present

## 2014-12-08 DIAGNOSIS — F489 Nonpsychotic mental disorder, unspecified: Secondary | ICD-10-CM | POA: Diagnosis not present

## 2014-12-08 DIAGNOSIS — M549 Dorsalgia, unspecified: Secondary | ICD-10-CM

## 2014-12-08 DIAGNOSIS — N281 Cyst of kidney, acquired: Secondary | ICD-10-CM | POA: Diagnosis not present

## 2014-12-08 DIAGNOSIS — F209 Schizophrenia, unspecified: Secondary | ICD-10-CM | POA: Diagnosis present

## 2014-12-08 DIAGNOSIS — Z794 Long term (current) use of insulin: Secondary | ICD-10-CM

## 2014-12-08 DIAGNOSIS — N3281 Overactive bladder: Secondary | ICD-10-CM | POA: Diagnosis present

## 2014-12-08 DIAGNOSIS — Y95 Nosocomial condition: Secondary | ICD-10-CM | POA: Diagnosis present

## 2014-12-08 DIAGNOSIS — F17200 Nicotine dependence, unspecified, uncomplicated: Secondary | ICD-10-CM | POA: Diagnosis not present

## 2014-12-08 DIAGNOSIS — F0390 Unspecified dementia without behavioral disturbance: Secondary | ICD-10-CM | POA: Diagnosis present

## 2014-12-08 DIAGNOSIS — I10 Essential (primary) hypertension: Secondary | ICD-10-CM | POA: Diagnosis present

## 2014-12-08 DIAGNOSIS — R509 Fever, unspecified: Secondary | ICD-10-CM | POA: Diagnosis not present

## 2014-12-08 DIAGNOSIS — R4182 Altered mental status, unspecified: Secondary | ICD-10-CM | POA: Diagnosis not present

## 2014-12-08 DIAGNOSIS — E118 Type 2 diabetes mellitus with unspecified complications: Secondary | ICD-10-CM

## 2014-12-08 LAB — URINALYSIS, ROUTINE W REFLEX MICROSCOPIC
BILIRUBIN URINE: NEGATIVE
Glucose, UA: >1000 mg/dL — AB
KETONES UR: NEGATIVE mg/dL
LEUKOCYTES UA: NEGATIVE
NITRITE: NEGATIVE
PH: 6 (ref 5.0–8.0)
SPECIFIC GRAVITY, URINE: 1.02 (ref 1.005–1.030)
UROBILINOGEN UA: 0.2 mg/dL (ref 0.0–1.0)

## 2014-12-08 LAB — GLUCOSE, CAPILLARY
GLUCOSE-CAPILLARY: 124 mg/dL — AB (ref 70–99)
Glucose-Capillary: 185 mg/dL — ABNORMAL HIGH (ref 70–99)

## 2014-12-08 LAB — CBC WITH DIFFERENTIAL/PLATELET
BASOS ABS: 0 10*3/uL (ref 0.0–0.1)
BASOS PCT: 0 % (ref 0–1)
Eosinophils Absolute: 0 10*3/uL (ref 0.0–0.7)
Eosinophils Relative: 0 % (ref 0–5)
HEMATOCRIT: 38.9 % — AB (ref 39.0–52.0)
Hemoglobin: 12.6 g/dL — ABNORMAL LOW (ref 13.0–17.0)
LYMPHS PCT: 11 % — AB (ref 12–46)
Lymphs Abs: 1.6 10*3/uL (ref 0.7–4.0)
MCH: 35.5 pg — AB (ref 26.0–34.0)
MCHC: 32.4 g/dL (ref 30.0–36.0)
MCV: 109.6 fL — AB (ref 78.0–100.0)
Monocytes Absolute: 1.3 10*3/uL — ABNORMAL HIGH (ref 0.1–1.0)
Monocytes Relative: 9 % (ref 3–12)
NEUTROS ABS: 12.1 10*3/uL — AB (ref 1.7–7.7)
NEUTROS PCT: 80 % — AB (ref 43–77)
Platelets: 196 10*3/uL (ref 150–400)
RBC: 3.55 MIL/uL — ABNORMAL LOW (ref 4.22–5.81)
RDW: 15.6 % — AB (ref 11.5–15.5)
WBC: 15.1 10*3/uL — AB (ref 4.0–10.5)

## 2014-12-08 LAB — COMPREHENSIVE METABOLIC PANEL
ALBUMIN: 3.5 g/dL (ref 3.5–5.2)
ALT: 39 U/L (ref 0–53)
ANION GAP: 8 (ref 5–15)
AST: 72 U/L — ABNORMAL HIGH (ref 0–37)
Alkaline Phosphatase: 76 U/L (ref 39–117)
BILIRUBIN TOTAL: 0.4 mg/dL (ref 0.3–1.2)
BUN: 17 mg/dL (ref 6–23)
CO2: 27 mmol/L (ref 19–32)
CREATININE: 1.02 mg/dL (ref 0.50–1.35)
Calcium: 8.8 mg/dL (ref 8.4–10.5)
Chloride: 102 mmol/L (ref 96–112)
GFR calc Af Amer: 83 mL/min — ABNORMAL LOW (ref >90)
GFR calc non Af Amer: 72 mL/min — ABNORMAL LOW (ref >90)
Glucose, Bld: 266 mg/dL — ABNORMAL HIGH (ref 70–99)
Potassium: 3.8 mmol/L (ref 3.5–5.1)
SODIUM: 137 mmol/L (ref 135–145)
TOTAL PROTEIN: 6.8 g/dL (ref 6.0–8.3)

## 2014-12-08 LAB — URINE MICROSCOPIC-ADD ON

## 2014-12-08 LAB — LACTIC ACID, PLASMA: Lactic Acid, Venous: 2 mmol/L (ref 0.5–2.0)

## 2014-12-08 MED ORDER — METFORMIN HCL 500 MG PO TABS
500.0000 mg | ORAL_TABLET | Freq: Two times a day (BID) | ORAL | Status: DC
  Administered 2014-12-08 – 2014-12-12 (×9): 500 mg via ORAL
  Filled 2014-12-08 (×9): qty 1

## 2014-12-08 MED ORDER — INSULIN DETEMIR 100 UNIT/ML ~~LOC~~ SOLN
30.0000 [IU] | Freq: Every day | SUBCUTANEOUS | Status: DC
  Administered 2014-12-09 – 2014-12-12 (×4): 30 [IU] via SUBCUTANEOUS
  Filled 2014-12-08 (×6): qty 0.3

## 2014-12-08 MED ORDER — MEMANTINE HCL 10 MG PO TABS
10.0000 mg | ORAL_TABLET | Freq: Two times a day (BID) | ORAL | Status: DC
  Administered 2014-12-08 – 2014-12-12 (×8): 10 mg via ORAL
  Filled 2014-12-08 (×10): qty 1

## 2014-12-08 MED ORDER — DEXTROSE 5 % IV SOLN
INTRAVENOUS | Status: AC
  Filled 2014-12-08: qty 1

## 2014-12-08 MED ORDER — VANCOMYCIN HCL IN DEXTROSE 1-5 GM/200ML-% IV SOLN
INTRAVENOUS | Status: AC
  Filled 2014-12-08: qty 200

## 2014-12-08 MED ORDER — CLONAZEPAM 0.5 MG PO TABS
0.5000 mg | ORAL_TABLET | Freq: Three times a day (TID) | ORAL | Status: DC
  Administered 2014-12-08 – 2014-12-12 (×13): 0.5 mg via ORAL
  Filled 2014-12-08 (×13): qty 1

## 2014-12-08 MED ORDER — SIMVASTATIN 20 MG PO TABS
40.0000 mg | ORAL_TABLET | Freq: Every evening | ORAL | Status: DC
  Administered 2014-12-08 – 2014-12-11 (×4): 40 mg via ORAL
  Filled 2014-12-08 (×4): qty 2

## 2014-12-08 MED ORDER — PANTOPRAZOLE SODIUM 40 MG PO TBEC
40.0000 mg | DELAYED_RELEASE_TABLET | Freq: Every day | ORAL | Status: DC
Start: 2014-12-08 — End: 2014-12-08

## 2014-12-08 MED ORDER — HEPARIN SODIUM (PORCINE) 5000 UNIT/ML IJ SOLN
5000.0000 [IU] | Freq: Three times a day (TID) | INTRAMUSCULAR | Status: DC
  Administered 2014-12-08 – 2014-12-12 (×12): 5000 [IU] via SUBCUTANEOUS
  Filled 2014-12-08 (×10): qty 1

## 2014-12-08 MED ORDER — TAMSULOSIN HCL 0.4 MG PO CAPS
0.4000 mg | ORAL_CAPSULE | Freq: Every day | ORAL | Status: DC
  Administered 2014-12-09 – 2014-12-12 (×4): 0.4 mg via ORAL
  Filled 2014-12-08 (×4): qty 1

## 2014-12-08 MED ORDER — INSULIN ASPART 100 UNIT/ML ~~LOC~~ SOLN
0.0000 [IU] | Freq: Three times a day (TID) | SUBCUTANEOUS | Status: DC
  Administered 2014-12-08: 3 [IU] via SUBCUTANEOUS
  Administered 2014-12-09 – 2014-12-10 (×4): 4 [IU] via SUBCUTANEOUS
  Administered 2014-12-11: 3 [IU] via SUBCUTANEOUS
  Administered 2014-12-11: 4 [IU] via SUBCUTANEOUS
  Administered 2014-12-12: 7 [IU] via SUBCUTANEOUS
  Administered 2014-12-12: 3 [IU] via SUBCUTANEOUS

## 2014-12-08 MED ORDER — LATANOPROST 0.005 % OP SOLN
1.0000 [drp] | Freq: Every day | OPHTHALMIC | Status: DC
  Administered 2014-12-08 – 2014-12-11 (×4): 1 [drp] via OPHTHALMIC
  Filled 2014-12-08: qty 2.5

## 2014-12-08 MED ORDER — CARBAMAZEPINE 200 MG PO TABS
200.0000 mg | ORAL_TABLET | Freq: Three times a day (TID) | ORAL | Status: DC
  Administered 2014-12-08 – 2014-12-12 (×13): 200 mg via ORAL
  Filled 2014-12-08 (×13): qty 1

## 2014-12-08 MED ORDER — LATANOPROST 0.005 % OP SOLN
OPHTHALMIC | Status: AC
  Filled 2014-12-08: qty 2.5

## 2014-12-08 MED ORDER — FOLIC ACID 1 MG PO TABS
1.0000 mg | ORAL_TABLET | Freq: Every day | ORAL | Status: DC
  Administered 2014-12-09 – 2014-12-12 (×4): 1 mg via ORAL
  Filled 2014-12-08 (×4): qty 1

## 2014-12-08 MED ORDER — VITAMIN D (ERGOCALCIFEROL) 1.25 MG (50000 UNIT) PO CAPS
50000.0000 [IU] | ORAL_CAPSULE | ORAL | Status: DC
  Administered 2014-12-12: 50000 [IU] via ORAL
  Filled 2014-12-08: qty 1

## 2014-12-08 MED ORDER — DEXTROSE 5 % IV SOLN: 1.0000 g | Freq: Three times a day (TID) | INTRAVENOUS | Status: DC

## 2014-12-08 MED ORDER — DEXTROSE 5 % IV SOLN
1.0000 g | Freq: Three times a day (TID) | INTRAVENOUS | Status: DC
  Administered 2014-12-08 – 2014-12-12 (×12): 1 g via INTRAVENOUS
  Filled 2014-12-08 (×14): qty 1

## 2014-12-08 MED ORDER — VANCOMYCIN HCL IN DEXTROSE 1-5 GM/200ML-% IV SOLN
1000.0000 mg | Freq: Two times a day (BID) | INTRAVENOUS | Status: DC
  Administered 2014-12-08 – 2014-12-12 (×8): 1000 mg via INTRAVENOUS
  Filled 2014-12-08 (×10): qty 200

## 2014-12-08 MED ORDER — PANTOPRAZOLE SODIUM 40 MG PO TBEC
40.0000 mg | DELAYED_RELEASE_TABLET | Freq: Every day | ORAL | Status: DC
  Administered 2014-12-09 – 2014-12-12 (×4): 40 mg via ORAL
  Filled 2014-12-08 (×4): qty 1

## 2014-12-08 MED ORDER — LISINOPRIL 10 MG PO TABS
10.0000 mg | ORAL_TABLET | Freq: Every day | ORAL | Status: DC
  Administered 2014-12-09 – 2014-12-12 (×4): 10 mg via ORAL
  Filled 2014-12-08 (×4): qty 1

## 2014-12-08 MED ORDER — SENNA 8.6 MG PO TABS
1.0000 | ORAL_TABLET | Freq: Every day | ORAL | Status: DC
  Administered 2014-12-08 – 2014-12-11 (×4): 8.6 mg via ORAL
  Filled 2014-12-08 (×5): qty 1

## 2014-12-08 MED ORDER — OXYBUTYNIN CHLORIDE 5 MG PO TABS
5.0000 mg | ORAL_TABLET | Freq: Two times a day (BID) | ORAL | Status: DC
  Administered 2014-12-08 – 2014-12-12 (×8): 5 mg via ORAL
  Filled 2014-12-08 (×10): qty 1

## 2014-12-08 MED ORDER — DONEPEZIL HCL 5 MG PO TABS
10.0000 mg | ORAL_TABLET | Freq: Every day | ORAL | Status: DC
  Administered 2014-12-08 – 2014-12-11 (×4): 10 mg via ORAL
  Filled 2014-12-08 (×5): qty 2

## 2014-12-08 MED ORDER — OLANZAPINE 5 MG PO TABS
10.0000 mg | ORAL_TABLET | Freq: Every day | ORAL | Status: DC
  Administered 2014-12-08 – 2014-12-11 (×4): 10 mg via ORAL
  Filled 2014-12-08 (×4): qty 2

## 2014-12-08 MED ORDER — INSULIN ASPART 100 UNIT/ML ~~LOC~~ SOLN: 0.0000 [IU] | Freq: Every day | SUBCUTANEOUS | Status: DC

## 2014-12-08 MED ORDER — DEXTROSE 5 % IV SOLN
1.0000 g | Freq: Once | INTRAVENOUS | Status: AC
  Administered 2014-12-08: 1 g via INTRAVENOUS
  Filled 2014-12-08: qty 1

## 2014-12-08 MED ORDER — ACETAMINOPHEN 500 MG PO TABS
1000.0000 mg | ORAL_TABLET | Freq: Once | ORAL | Status: AC
  Administered 2014-12-08: 1000 mg via ORAL
  Filled 2014-12-08: qty 2

## 2014-12-08 NOTE — ED Provider Notes (Addendum)
Medical screening examination/treatment/procedure(s) were conducted as a shared visit with non-physician practitioner(s) and myself.  I personally evaluated the patient during the encounter.   EKG Interpretation None      Results for orders placed or performed during the hospital encounter of 12/08/14  Urinalysis, Routine w reflex microscopic  Result Value Ref Range   Color, Urine YELLOW YELLOW   APPearance CLEAR CLEAR   Specific Gravity, Urine 1.020 1.005 - 1.030   pH 6.0 5.0 - 8.0   Glucose, UA >1000 (A) NEGATIVE mg/dL   Hgb urine dipstick SMALL (A) NEGATIVE   Bilirubin Urine NEGATIVE NEGATIVE   Ketones, ur NEGATIVE NEGATIVE mg/dL   Protein, ur TRACE (A) NEGATIVE mg/dL   Urobilinogen, UA 0.2 0.0 - 1.0 mg/dL   Nitrite NEGATIVE NEGATIVE   Leukocytes, UA NEGATIVE NEGATIVE  CBC with Differential  Result Value Ref Range   WBC 15.1 (H) 4.0 - 10.5 K/uL   RBC 3.55 (L) 4.22 - 5.81 MIL/uL   Hemoglobin 12.6 (L) 13.0 - 17.0 g/dL   HCT 16.1 (L) 09.6 - 04.5 %   MCV 109.6 (H) 78.0 - 100.0 fL   MCH 35.5 (H) 26.0 - 34.0 pg   MCHC 32.4 30.0 - 36.0 g/dL   RDW 40.9 (H) 81.1 - 91.4 %   Platelets 196 150 - 400 K/uL   Neutrophils Relative % 80 (H) 43 - 77 %   Neutro Abs 12.1 (H) 1.7 - 7.7 K/uL   Lymphocytes Relative 11 (L) 12 - 46 %   Lymphs Abs 1.6 0.7 - 4.0 K/uL   Monocytes Relative 9 3 - 12 %   Monocytes Absolute 1.3 (H) 0.1 - 1.0 K/uL   Eosinophils Relative 0 0 - 5 %   Eosinophils Absolute 0.0 0.0 - 0.7 K/uL   Basophils Relative 0 0 - 1 %   Basophils Absolute 0.0 0.0 - 0.1 K/uL  Comprehensive metabolic panel  Result Value Ref Range   Sodium 137 135 - 145 mmol/L   Potassium 3.8 3.5 - 5.1 mmol/L   Chloride 102 96 - 112 mmol/L   CO2 27 19 - 32 mmol/L   Glucose, Bld 266 (H) 70 - 99 mg/dL   BUN 17 6 - 23 mg/dL   Creatinine, Ser 7.82 0.50 - 1.35 mg/dL   Calcium 8.8 8.4 - 95.6 mg/dL   Total Protein 6.8 6.0 - 8.3 g/dL   Albumin 3.5 3.5 - 5.2 g/dL   AST 72 (H) 0 - 37 U/L   ALT 39 0 - 53  U/L   Alkaline Phosphatase 76 39 - 117 U/L   Total Bilirubin 0.4 0.3 - 1.2 mg/dL   GFR calc non Af Amer 72 (L) >90 mL/min   GFR calc Af Amer 83 (L) >90 mL/min   Anion gap 8 5 - 15  Urine microscopic-add on  Result Value Ref Range   Squamous Epithelial / LPF FEW (A) RARE   WBC, UA 0-2 <3 WBC/hpf   RBC / HPF 0-2 <3 RBC/hpf   Bacteria, UA FEW (A) RARE   Dg Chest 2 View  12/08/2014   CLINICAL DATA:  Back pain, fever.  EXAM: CHEST  2 VIEW  COMPARISON:  September 05, 2014.  FINDINGS: Stable cardiomediastinal silhouette. No pneumothorax or significant pleural effusion is noted. Stable eventration of the left hemidiaphragm is noted. Mild right basilar subsegmental atelectasis or pneumonia is noted. Bony thorax is intact.  IMPRESSION: Mild right basilar subsegmental atelectasis or pneumonia. Short-term follow-up radiographs are recommended until resolution.  Electronically Signed   By: Lupita Raider, M.D.   On: 12/08/2014 14:35    [facility], for low worsening of baseline on mental status. Low back pain and strong urine smell and not being a steady on his feet as usual. Symptoms started yesterday.  Patient has a history of dementia review of systems is a level V caveat history is all per caregivers. Urinalysis was negative. So we decided to look deeper as patient did have a history of fever and has a temp here of 100.8. And patient has had a little bit of baseline tachycardia. Blood culture sent white count is elevated 15,000 electrolytes without significant abnormalities. Urinalysis is stated was negative Sinnott the cause of the fever. Chest x-rays raising concern for pneumonia that could be the answer. Lactic acid ordered but not back yet. Patient does not toxic appearing. Also CT scan of head and abdomen was ordered. Most likely will require treatment for the pneumonia whether that means inpatient or not is not clear. There has been no  hypotension.  Vanetta Mulders, MD 12/08/14 1452  Addendum:  Patient will require admission for the pneumonia. Patient in nursing facility so probably would be consistent with a healthcare acquired pneumonia. CT of head CT of abdomen without any significant findings.  Results for orders placed or performed during the hospital encounter of 12/08/14  Blood culture (routine x 2)  Result Value Ref Range   Specimen Description BLOOD LEFT ANTECUBITAL    Special Requests BOTTLES DRAWN AEROBIC AND ANAEROBIC 8CC    Culture NO GROWTH <24 HRS    Report Status PENDING   Blood culture (routine x 2)  Result Value Ref Range   Specimen Description BLOOD RIGHT ANTECUBITAL    Special Requests      BOTTLES DRAWN AEROBIC AND ANAEROBIC AEB=8CC ANA=6CC   Culture NO GROWTH <24 HRS    Report Status PENDING   Urinalysis, Routine w reflex microscopic  Result Value Ref Range   Color, Urine YELLOW YELLOW   APPearance CLEAR CLEAR   Specific Gravity, Urine 1.020 1.005 - 1.030   pH 6.0 5.0 - 8.0   Glucose, UA >1000 (A) NEGATIVE mg/dL   Hgb urine dipstick SMALL (A) NEGATIVE   Bilirubin Urine NEGATIVE NEGATIVE   Ketones, ur NEGATIVE NEGATIVE mg/dL   Protein, ur TRACE (A) NEGATIVE mg/dL   Urobilinogen, UA 0.2 0.0 - 1.0 mg/dL   Nitrite NEGATIVE NEGATIVE   Leukocytes, UA NEGATIVE NEGATIVE  CBC with Differential  Result Value Ref Range   WBC 15.1 (H) 4.0 - 10.5 K/uL   RBC 3.55 (L) 4.22 - 5.81 MIL/uL   Hemoglobin 12.6 (L) 13.0 - 17.0 g/dL   HCT 16.1 (L) 09.6 - 04.5 %   MCV 109.6 (H) 78.0 - 100.0 fL   MCH 35.5 (H) 26.0 - 34.0 pg   MCHC 32.4 30.0 - 36.0 g/dL   RDW 40.9 (H) 81.1 - 91.4 %   Platelets 196 150 - 400 K/uL   Neutrophils Relative % 80 (H) 43 - 77 %   Neutro Abs 12.1 (H) 1.7 - 7.7 K/uL   Lymphocytes Relative 11 (L) 12 - 46 %   Lymphs Abs 1.6 0.7 - 4.0 K/uL   Monocytes Relative 9 3 - 12 %   Monocytes Absolute 1.3 (H) 0.1 - 1.0 K/uL   Eosinophils Relative 0 0 - 5 %   Eosinophils Absolute 0.0 0.0  - 0.7 K/uL   Basophils Relative 0  0 - 1 %   Basophils Absolute 0.0 0.0 - 0.1 K/uL  Comprehensive metabolic panel  Result Value Ref Range   Sodium 137 135 - 145 mmol/L   Potassium 3.8 3.5 - 5.1 mmol/L   Chloride 102 96 - 112 mmol/L   CO2 27 19 - 32 mmol/L   Glucose, Bld 266 (H) 70 - 99 mg/dL   BUN 17 6 - 23 mg/dL   Creatinine, Ser 1.61 0.50 - 1.35 mg/dL   Calcium 8.8 8.4 - 09.6 mg/dL   Total Protein 6.8 6.0 - 8.3 g/dL   Albumin 3.5 3.5 - 5.2 g/dL   AST 72 (H) 0 - 37 U/L   ALT 39 0 - 53 U/L   Alkaline Phosphatase 76 39 - 117 U/L   Total Bilirubin 0.4 0.3 - 1.2 mg/dL   GFR calc non Af Amer 72 (L) >90 mL/min   GFR calc Af Amer 83 (L) >90 mL/min   Anion gap 8 5 - 15  Urine microscopic-add on  Result Value Ref Range   Squamous Epithelial / LPF FEW (A) RARE   WBC, UA 0-2 <3 WBC/hpf   RBC / HPF 0-2 <3 RBC/hpf   Bacteria, UA FEW (A) RARE  Lactic acid, plasma  Result Value Ref Range   Lactic Acid, Venous 2.0 0.5 - 2.0 mmol/L  HIV antibody  Result Value Ref Range   HIV Screen 4th Generation wRfx Non Reactive Non Reactive  Comprehensive metabolic panel  Result Value Ref Range   Sodium 141 135 - 145 mmol/L   Potassium 4.0 3.5 - 5.1 mmol/L   Chloride 105 96 - 112 mmol/L   CO2 30 19 - 32 mmol/L   Glucose, Bld 140 (H) 70 - 99 mg/dL   BUN 17 6 - 23 mg/dL   Creatinine, Ser 0.45 0.50 - 1.35 mg/dL   Calcium 8.4 8.4 - 40.9 mg/dL   Total Protein 6.0 6.0 - 8.3 g/dL   Albumin 2.9 (L) 3.5 - 5.2 g/dL   AST 49 (H) 0 - 37 U/L   ALT 35 0 - 53 U/L   Alkaline Phosphatase 69 39 - 117 U/L   Total Bilirubin 0.4 0.3 - 1.2 mg/dL   GFR calc non Af Amer 81 (L) >90 mL/min   GFR calc Af Amer >90 >90 mL/min   Anion gap 6 5 - 15  CBC  Result Value Ref Range   WBC 12.1 (H) 4.0 - 10.5 K/uL   RBC 3.18 (L) 4.22 - 5.81 MIL/uL   Hemoglobin 11.5 (L) 13.0 - 17.0 g/dL   HCT 81.1 (L) 91.4 - 78.2 %   MCV 110.4 (H) 78.0 - 100.0 fL   MCH 36.2 (H) 26.0 - 34.0 pg   MCHC 32.8 30.0 - 36.0 g/dL   RDW 95.6 (H) 21.3  - 15.5 %   Platelets 189 150 - 400 K/uL  Glucose, capillary  Result Value Ref Range   Glucose-Capillary 124 (H) 70 - 99 mg/dL  Glucose, capillary  Result Value Ref Range   Glucose-Capillary 185 (H) 70 - 99 mg/dL   Comment 1 Notify RN    Comment 2 Document in Chart    Dg Chest 2 View  12/08/2014   CLINICAL DATA:  Back pain, fever.  EXAM: CHEST  2 VIEW  COMPARISON:  September 05, 2014.  FINDINGS: Stable cardiomediastinal silhouette. No pneumothorax or significant pleural effusion is noted. Stable eventration of the left hemidiaphragm is noted. Mild right basilar subsegmental atelectasis or pneumonia is noted. Bony  thorax is intact.  IMPRESSION: Mild right basilar subsegmental atelectasis or pneumonia. Short-term follow-up radiographs are recommended until resolution.   Electronically Signed   By: Lupita RaiderJames  Green Jr, M.D.   On: 12/08/2014 14:35   Ct Head Wo Contrast  12/08/2014   CLINICAL DATA:  Low back pain for 1 day.  Altered mental status.  EXAM: CT HEAD WITHOUT CONTRAST  TECHNIQUE: Contiguous axial images were obtained from the base of the skull through the vertex without intravenous contrast.  COMPARISON:  CT head 10/16/2008.  FINDINGS: The patient was unable to remain motionless for the exam. Small or subtle lesions could be overlooked.  No evidence for acute infarction, hemorrhage, mass lesion, hydrocephalus, or extra-axial fluid. Mild cerebral and cerebellar atrophy. Hypoattenuation of white matter suggesting small vessel disease. Calvarium intact. Old medial blowout fracture RIGHT orbit. Vascular calcification. No acute sinus or mastoid disease.  IMPRESSION: Motion degraded exam. No definite acute intracranial findings. Similar appearance to prior CT.  Evidence for old RIGHT orbital trauma.   Electronically Signed   By: Davonna BellingJohn  Curnes M.D.   On: 12/08/2014 15:10   Ct Renal Stone Study  12/08/2014   CLINICAL DATA:  Low back pain  for 1 day with fever.  EXAM: CT ABDOMEN AND PELVIS WITHOUT CONTRAST   TECHNIQUE: Multidetector CT imaging of the abdomen and pelvis was performed following the standard protocol without IV contrast.  COMPARISON:  CT AP with contrast 12/25/2013.  FINDINGS: COPD changes lung bases. Mild RIGHT basilar scarring. These are stable from priors. Marked elevation LEFT hemidiaphragm. No intrarenal or proximal ureteral calculi on either side. No evidence of hydronephrosis or other secondary signs of upper urinary tract obstruction. Within limits of unenhanced technique, normal appearing renal cortex. BILATERAL renal cystic disease appears grossly stable compared with prior CT abdomen pelvis with contrast performed 12/25/2013.  Again, within limits of unenhanced technique, remaining visualized upper abdomen unremarkable. Visualized extreme lung bases clear. Stable RIGHT adrenal nodule, likely adenoma. Stable gallstones without signs of cholecystitis. No appendiceal inflammation.  No distal ureteral calculi on either side. Uncomplicated appearing umbilical hernia. Non aneurysmal atherosclerotic calcification of the aorta. Moderately thick-walled bladder uncertain significance. No worrisome osseous lesions.  IMPRESSION: Within limits of evaluation on noncontrast CT, stable BILATERAL renal cystic disease. No obstructive uropathy. Gallstones.   Electronically Signed   By: Davonna BellingJohn  Curnes M.D.   On: 12/08/2014 15:07      Vanetta MuldersScott Haliey Romberg, MD 12/09/14 [phone]

## 2014-12-08 NOTE — H&P (Signed)
[facility] History and Physical  Andre Rowe ZOX:[id number] DOB: June 16, 1941 DOA: 12/08/2014  Referring physician: ER PCP: Fredirick Maudlin, MD   Chief Complaint: Low-grade fever. Back pain.  HPI: Andre Rowe is a 74 y.o. male  This is a 75 year old man, diabetic, who has a history of Alzheimer's dementia and schizophrenia, presents with primary symptom of low-grade fever and back pain. He was sent here from the group home that he lives at. The patient, due to his dementia and schizophrenia, is unable to give me any clear history and is not quite sure why he is here. According to the patient's nurse at the bedside, he has had a cough. She is not able to give me any other clear history. Evaluation in the emergency room shows that the patient has leukocytosis and chest x-ray consistent with possible pneumonia. He is now being admitted for further management.   Review of Systems:  Unable to obtain from the patient secondary to the patient's baseline dementia.  Past Medical History  Diagnosis Date  . High cholesterol   . GERD (gastroesophageal reflux disease)   . Hypertension   . Alzheimer disease   . Diabetes mellitus without complication   . Schizophrenia   . Dementia   . Overactive bladder    Past Surgical History  Procedure Laterality Date  . Unable     Social History:  reports that he has been smoking.  He does not have any smokeless tobacco history on file. He reports that he does not drink alcohol or use illicit drugs.  No Known Allergies  Family history: unable to obtain family history secondary to the patient's dementia.   Prior to Admission medications   Medication Sig Start Date End Date Taking? Authorizing Provider  bimatoprost (LUMIGAN) 0.01 % SOLN Place 1 drop into both eyes at bedtime.   Yes Historical Provider, MD  carbamazepine (TEGRETOL) 200 MG tablet Take 200 mg by mouth 3 (three) times daily.   Yes Historical Provider, MD  clonazePAM  (KLONOPIN) 0.5 MG tablet Take 0.5 mg by mouth 3 (three) times daily.   Yes Historical Provider, MD  donepezil (ARICEPT) 10 MG tablet Take 10 mg by mouth at bedtime.   Yes Historical Provider, MD  folic acid (FOLVITE) 1 MG tablet Take 1 mg by mouth daily.   Yes Historical Provider, MD  insulin aspart (NOVOLOG) 100 UNIT/ML injection Inject 5 Units into the skin 3 (three) times daily before meals. If blood sugar is 90-150. Sliding scale: 150-200=1 unit; 201-250=2 units; 251-300=3 units; 301-350=4 units; 351-400=5 units; Greater than 400=6 units & CALL DR.   Yes Historical Provider, MD  insulin detemir (LEVEMIR) 100 UNIT/ML injection Inject 30 Units into the skin daily.    Yes Historical Provider, MD  lisinopril (PRINIVIL,ZESTRIL) 10 MG tablet Take 10 mg by mouth daily.   Yes Historical Provider, MD  memantine (NAMENDA) 10 MG tablet Take 10 mg by mouth 2 (two) times daily.   Yes Historical Provider, MD  metFORMIN (GLUCOPHAGE) 500 MG tablet Take 500 mg by mouth 2 (two) times daily with a meal.   Yes Historical Provider, MD  OLANZapine (ZYPREXA) 10 MG tablet Take 10 mg by mouth at bedtime.   Yes Historical Provider, MD  omeprazole (PRILOSEC) 20 MG capsule Take 20 mg by mouth daily.   Yes Historical Provider, MD  oxybutynin (DITROPAN) 5 MG tablet Take 5 mg by mouth 2 (two) times daily.   Yes Historical Provider, MD  senna (SENOKOT) 8.6 MG TABS tablet Take 1  tablet by mouth at bedtime.   Yes Historical Provider, MD  simvastatin (ZOCOR) 40 MG tablet Take 40 mg by mouth every evening.   Yes Historical Provider, MD  tamsulosin (FLOMAX) 0.4 MG CAPS Take 0.4 mg by mouth daily.   Yes Historical Provider, MD  Vitamin D, Ergocalciferol, (DRISDOL) 50000 UNITS CAPS capsule Take 50,000 Units by mouth every 7 (seven) days.   Yes Historical Provider, MD   Physical Exam: Filed Vitals:   12/08/14 1430 12/08/14 1500 12/08/14 1515 12/08/14 1600  BP:  121/68  106/70  Pulse:      Temp:    99 F (37.2 C)  TempSrc:     Oral  Resp: 18 23 21 25   Height:      Weight:      SpO2:  96%  99%    Wt Readings from Last 3 Encounters:  12/08/14 86.183 kg (190 lb)  03/20/13 84.057 kg (185 lb 5 oz)    General:  Appears calm and comfortable. He does not look toxic or septic. However, he does feel warm to the touch. He has a temperature of 99 degrees Fahrenheit. Eyes: PERRL, normal lids, irises & conjunctiva ENT: grossly normal hearing, lips & tongue Neck: no LAD, masses or thyromegaly Cardiovascular: RRR, no m/r/g. No LE edema. Telemetry: SR, no arrhythmias  Respiratory: Reduced air entry on the right mid and lower zones. There is no broncho-breathing or crackles throughout the lung fields. There is no wheezing. Abdomen: soft, ntnd Skin: no rash or induration seen on limited exam Musculoskeletal: grossly normal tone BUE/BLE Psychiatric: Not evaluated. Neurologic: grossly non-focal.          Labs on Admission:  Basic Metabolic Panel:  Recent Labs Lab 12/08/14 1158  NA 137  K 3.8  CL 102  CO2 27  GLUCOSE 266*  BUN 17  CREATININE 1.02  CALCIUM 8.8   Liver Function Tests:  Recent Labs Lab 12/08/14 1158  AST 72*  ALT 39  ALKPHOS 76  BILITOT 0.4  PROT 6.8  ALBUMIN 3.5   No results for input(s): LIPASE, AMYLASE in the last 168 hours. No results for input(s): AMMONIA in the last 168 hours. CBC:  Recent Labs Lab 12/08/14 1158  WBC 15.1*  NEUTROABS 12.1*  HGB 12.6*  HCT 38.9*  MCV 109.6*  PLT 196   Cardiac Enzymes: No results for input(s): CKTOTAL, CKMB, CKMBINDEX, TROPONINI in the last 168 hours.  BNP (last 3 results) No results for input(s): BNP in the last 8760 hours.  ProBNP (last 3 results) No results for input(s): PROBNP in the last 8760 hours.  CBG: No results for input(s): GLUCAP in the last 168 hours.  Radiological Exams on Admission: Dg Chest 2 View  12/08/2014   CLINICAL DATA:  Back pain, fever.  EXAM: CHEST  2 VIEW  COMPARISON:  September 05, 2014.  FINDINGS:  Stable cardiomediastinal silhouette. No pneumothorax or significant pleural effusion is noted. Stable eventration of the left hemidiaphragm is noted. Mild right basilar subsegmental atelectasis or pneumonia is noted. Bony thorax is intact.  IMPRESSION: Mild right basilar subsegmental atelectasis or pneumonia. Short-term follow-up radiographs are recommended until resolution.   Electronically Signed   By: Lupita RaiderJames  Green Jr, M.D.   On: 12/08/2014 14:35   Ct Head Wo Contrast  12/08/2014   CLINICAL DATA:  Low back pain for 1 day.  Altered mental status.  EXAM: CT HEAD WITHOUT CONTRAST  TECHNIQUE: Contiguous axial images were obtained from the base of the skull through the  vertex without intravenous contrast.  COMPARISON:  CT head 10/16/2008.  FINDINGS: The patient was unable to remain motionless for the exam. Small or subtle lesions could be overlooked.  No evidence for acute infarction, hemorrhage, mass lesion, hydrocephalus, or extra-axial fluid. Mild cerebral and cerebellar atrophy. Hypoattenuation of white matter suggesting small vessel disease. Calvarium intact. Old medial blowout fracture RIGHT orbit. Vascular calcification. No acute sinus or mastoid disease.  IMPRESSION: Motion degraded exam. No definite acute intracranial findings. Similar appearance to prior CT.  Evidence for old RIGHT orbital trauma.   Electronically Signed   By: Davonna Belling M.D.   On: 12/08/2014 15:10   Ct Renal Stone Study  12/08/2014   CLINICAL DATA:  Low back pain  for 1 day with fever.  EXAM: CT ABDOMEN AND PELVIS WITHOUT CONTRAST  TECHNIQUE: Multidetector CT imaging of the abdomen and pelvis was performed following the standard protocol without IV contrast.  COMPARISON:  CT AP with contrast 12/25/2013.  FINDINGS: COPD changes lung bases. Mild RIGHT basilar scarring. These are stable from priors. Marked elevation LEFT hemidiaphragm. No intrarenal or proximal ureteral calculi on either side. No evidence of hydronephrosis or other  secondary signs of upper urinary tract obstruction. Within limits of unenhanced technique, normal appearing renal cortex. BILATERAL renal cystic disease appears grossly stable compared with prior CT abdomen pelvis with contrast performed 12/25/2013.  Again, within limits of unenhanced technique, remaining visualized upper abdomen unremarkable. Visualized extreme lung bases clear. Stable RIGHT adrenal nodule, likely adenoma. Stable gallstones without signs of cholecystitis. No appendiceal inflammation.  No distal ureteral calculi on either side. Uncomplicated appearing umbilical hernia. Non aneurysmal atherosclerotic calcification of the aorta. Moderately thick-walled bladder uncertain significance. No worrisome osseous lesions.  IMPRESSION: Within limits of evaluation on noncontrast CT, stable BILATERAL renal cystic disease. No obstructive uropathy. Gallstones.   Electronically Signed   By: Davonna Belling M.D.   On: 12/08/2014 15:07      Assessment/Plan   1. Probable healthcare associated pneumonia, right lower lobe. We will treat him with antibiotics, broad-spectrum. Thankfully, he does not appear to need supplemental oxygen. I anticipate he should not be in the hospital for very long. 2. Diabetes. Continue with home medications and sliding scale of insulin. 3. Hypertension. Stable. 4. Dementia. Stable. Continue with home medications. 5. Schizophrenia. Stable. Continue with home medications.  Further recommendations will depend on patient's hospital progress.   Code Status: Full code.  DVT Prophylaxis: Heparin.  Family Communication: I discussed the plan with the patient's nurse at the bedside.   Disposition Plan: Back to the group home when medically stable.  Time spent: 45 minutes.  Wilson Singer [facility] Pager [phone].

## 2014-12-08 NOTE — ED Provider Notes (Signed)
CSN: [id number]     Arrival date & time 12/08/14  1134 History   First MD Initiated Contact with Patient 12/08/14 1227     Chief Complaint  Patient presents with  . Back Pain  . Fever     (Consider location/radiation/quality/duration/timing/severity/associated sxs/prior Treatment) The history is provided by the nursing home and a caregiver.   Montrail Rowe is a 74 y.o. male with a past medical history significant for Alzheimer's disease, diabetes and schizophrenia along with overactive bladder present in with a 24-hour history of worsening baseline mental status, history of subjective fever, increased drowsiness and weakness along with complaints of low back pain and dark urine suggesting possible UTI.  He is generally ambulatory patient, this morning required assistance getting here.  He has had no medications prior to arrival for his condition.  He has had no vomiting, diarrhea.  Patient is a level V caveat given past medical history.   Past Medical History  Diagnosis Date  . High cholesterol   . GERD (gastroesophageal reflux disease)   . Hypertension   . Alzheimer disease   . Diabetes mellitus without complication   . Schizophrenia   . Dementia   . Overactive bladder    Past Surgical History  Procedure Laterality Date  . Unable     History reviewed. No pertinent family history. History  Substance Use Topics  . Smoking status: Current Every Day Smoker  . Smokeless tobacco: Not on file  . Alcohol Use: No    Review of Systems  Unable to perform ROS Constitutional: Positive for fever, activity change and fatigue.  Respiratory: Negative for cough.   Gastrointestinal: Negative for vomiting and diarrhea.  Musculoskeletal: Positive for back pain.      Allergies  Review of patient's allergies indicates no known allergies.  Home Medications   Prior to Admission medications   Medication Sig Start Date End Date Taking? Authorizing Provider  bimatoprost (LUMIGAN)  0.01 % SOLN Place 1 drop into both eyes at bedtime.   Yes Historical Provider, MD  carbamazepine (TEGRETOL) 200 MG tablet Take 200 mg by mouth 3 (three) times daily.   Yes Historical Provider, MD  clonazePAM (KLONOPIN) 0.5 MG tablet Take 0.5 mg by mouth 3 (three) times daily.   Yes Historical Provider, MD  donepezil (ARICEPT) 10 MG tablet Take 10 mg by mouth at bedtime.   Yes Historical Provider, MD  folic acid (FOLVITE) 1 MG tablet Take 1 mg by mouth daily.   Yes Historical Provider, MD  insulin aspart (NOVOLOG) 100 UNIT/ML injection Inject 5 Units into the skin 3 (three) times daily before meals. If blood sugar is 90-150. Sliding scale: 150-200=1 unit; 201-250=2 units; 251-300=3 units; 301-350=4 units; 351-400=5 units; Greater than 400=6 units & CALL DR.   Yes Historical Provider, MD  insulin detemir (LEVEMIR) 100 UNIT/ML injection Inject 30 Units into the skin daily.    Yes Historical Provider, MD  lisinopril (PRINIVIL,ZESTRIL) 10 MG tablet Take 10 mg by mouth daily.   Yes Historical Provider, MD  memantine (NAMENDA) 10 MG tablet Take 10 mg by mouth 2 (two) times daily.   Yes Historical Provider, MD  metFORMIN (GLUCOPHAGE) 500 MG tablet Take 500 mg by mouth 2 (two) times daily with a meal.   Yes Historical Provider, MD  OLANZapine (ZYPREXA) 10 MG tablet Take 10 mg by mouth at bedtime.   Yes Historical Provider, MD  omeprazole (PRILOSEC) 20 MG capsule Take 20 mg by mouth daily.   Yes Historical Provider, MD  oxybutynin (DITROPAN) 5 MG tablet Take 5 mg by mouth 2 (two) times daily.   Yes Historical Provider, MD  senna (SENOKOT) 8.6 MG TABS tablet Take 1 tablet by mouth at bedtime.   Yes Historical Provider, MD  simvastatin (ZOCOR) 40 MG tablet Take 40 mg by mouth every evening.   Yes Historical Provider, MD  tamsulosin (FLOMAX) 0.4 MG CAPS Take 0.4 mg by mouth daily.   Yes Historical Provider, MD  Vitamin D, Ergocalciferol, (DRISDOL) 50000 UNITS CAPS capsule Take 50,000 Units by mouth every 7  (seven) days.   Yes Historical Provider, MD   BP 121/68 mmHg  Pulse 114  Temp(Src) 100.8 F (38.2 C) (Oral)  Resp 21  Ht 5\' 10"  (1.778 m)  Wt 190 lb (86.183 kg)  BMI 27.26 kg/m2  SpO2 96% Physical Exam  Constitutional: He appears well-developed and well-nourished.  HENT:  Head: Normocephalic and atraumatic.  Mouth/Throat: Mucous membranes are dry.  Eyes: Conjunctivae are normal.  Neck: Normal range of motion.  Cardiovascular: Regular rhythm, normal heart sounds and intact distal pulses.  Tachycardia present.   Pulmonary/Chest: Effort normal and breath sounds normal. He has no wheezes.  Abdominal: Soft. Bowel sounds are normal. He exhibits no distension. There is no tenderness. There is CVA tenderness. There is no guarding.  ttp left flank  Musculoskeletal: Normal range of motion.  Neurological: He is alert.  Confused, near baseline per caregiver at bedside.  Skin: Skin is warm and dry.  Nursing note and vitals reviewed.   ED Course  Procedures (including critical care time) Labs Review Labs Reviewed  URINALYSIS, ROUTINE W REFLEX MICROSCOPIC - Abnormal; Notable for the following:    Glucose, UA >1000 (*)    Hgb urine dipstick SMALL (*)    Protein, ur TRACE (*)    All other components within normal limits  CBC WITH DIFFERENTIAL/PLATELET - Abnormal; Notable for the following:    WBC 15.1 (*)    RBC 3.55 (*)    Hemoglobin 12.6 (*)    HCT 38.9 (*)    MCV 109.6 (*)    MCH 35.5 (*)    RDW 15.6 (*)    Neutrophils Relative % 80 (*)    Neutro Abs 12.1 (*)    Lymphocytes Relative 11 (*)    Monocytes Absolute 1.3 (*)    All other components within normal limits  COMPREHENSIVE METABOLIC PANEL - Abnormal; Notable for the following:    Glucose, Bld 266 (*)    AST 72 (*)    GFR calc non Af Amer 72 (*)    GFR calc Af Amer 83 (*)    All other components within normal limits  URINE MICROSCOPIC-ADD ON - Abnormal; Notable for the following:    Squamous Epithelial / LPF FEW (*)     Bacteria, UA FEW (*)    All other components within normal limits  CULTURE, BLOOD (ROUTINE X 2)  CULTURE, BLOOD (ROUTINE X 2)  URINE CULTURE  LACTIC ACID, PLASMA    Imaging Review Dg Chest 2 View  12/08/2014   CLINICAL DATA:  Back pain, fever.  EXAM: CHEST  2 VIEW  COMPARISON:  September 05, 2014.  FINDINGS: Stable cardiomediastinal silhouette. No pneumothorax or significant pleural effusion is noted. Stable eventration of the left hemidiaphragm is noted. Mild right basilar subsegmental atelectasis or pneumonia is noted. Bony thorax is intact.  IMPRESSION: Mild right basilar subsegmental atelectasis or pneumonia. Short-term follow-up radiographs are recommended until resolution.   Electronically Signed   By: Fayrene FearingJames  Christen Butter, M.D.   On: 12/08/2014 14:35   Ct Head Wo Contrast  12/08/2014   CLINICAL DATA:  Low back pain for 1 day.  Altered mental status.  EXAM: CT HEAD WITHOUT CONTRAST  TECHNIQUE: Contiguous axial images were obtained from the base of the skull through the vertex without intravenous contrast.  COMPARISON:  CT head 10/16/2008.  FINDINGS: The patient was unable to remain motionless for the exam. Small or subtle lesions could be overlooked.  No evidence for acute infarction, hemorrhage, mass lesion, hydrocephalus, or extra-axial fluid. Mild cerebral and cerebellar atrophy. Hypoattenuation of white matter suggesting small vessel disease. Calvarium intact. Old medial blowout fracture RIGHT orbit. Vascular calcification. No acute sinus or mastoid disease.  IMPRESSION: Motion degraded exam. No definite acute intracranial findings. Similar appearance to prior CT.  Evidence for old RIGHT orbital trauma.   Electronically Signed   By: Davonna Belling M.D.   On: 12/08/2014 15:10   Ct Renal Stone Study  12/08/2014   CLINICAL DATA:  Low back pain  for 1 day with fever.  EXAM: CT ABDOMEN AND PELVIS WITHOUT CONTRAST  TECHNIQUE: Multidetector CT imaging of the abdomen and pelvis was performed following  the standard protocol without IV contrast.  COMPARISON:  CT AP with contrast 12/25/2013.  FINDINGS: COPD changes lung bases. Mild RIGHT basilar scarring. These are stable from priors. Marked elevation LEFT hemidiaphragm. No intrarenal or proximal ureteral calculi on either side. No evidence of hydronephrosis or other secondary signs of upper urinary tract obstruction. Within limits of unenhanced technique, normal appearing renal cortex. BILATERAL renal cystic disease appears grossly stable compared with prior CT abdomen pelvis with contrast performed 12/25/2013.  Again, within limits of unenhanced technique, remaining visualized upper abdomen unremarkable. Visualized extreme lung bases clear. Stable RIGHT adrenal nodule, likely adenoma. Stable gallstones without signs of cholecystitis. No appendiceal inflammation.  No distal ureteral calculi on either side. Uncomplicated appearing umbilical hernia. Non aneurysmal atherosclerotic calcification of the aorta. Moderately thick-walled bladder uncertain significance. No worrisome osseous lesions.  IMPRESSION: Within limits of evaluation on noncontrast CT, stable BILATERAL renal cystic disease. No obstructive uropathy. Gallstones.   Electronically Signed   By: Davonna Belling M.D.   On: 12/08/2014 15:07     EKG Interpretation None      MDM   Final diagnoses:  Back pain  HAP (hospital-acquired pneumonia)    Patients labs and/or radiological studies were reviewed and considered during the medical decision making and disposition process.  Results were also discussed with patient.  Patient was also seen by Dr. Deretha Emory during this visit.  Patient with a level 5 caveat given baseline history of Alzheimer's dementia with fever and back pain, hospital-acquired pneumonia given he currently resides in a rest home setting.  Will plan an overnight admission for IV antibiotics.  Spoke with Dr. Karilyn Cota, temp admission orders completed.  Burgess Amor, PA-C 12/08/14  1610

## 2014-12-08 NOTE — ED Notes (Signed)
Back pain,  Fever   Since yesterday.   [facility]  Tylenol last dose overnight.

## 2014-12-08 NOTE — Progress Notes (Signed)
Patient has red rash to L lower leg, warm to touch, and patient c/o pain upon touch.  Dr. Karilyn CotaGosrani notified.

## 2014-12-08 NOTE — ED Notes (Signed)
Caregiver reports fever and lower back pain x1 day with strong urine smell noted per staff.

## 2014-12-08 NOTE — Progress Notes (Signed)
ANTIBIOTIC CONSULT NOTE - INITIAL  Pharmacy Consult for Vancomycin Indication: pneumonia  No Known Allergies  Patient Measurements: Height: 5\' 10"  (177.8 cm) Weight: 190 lb (86.183 kg) IBW/kg (Calculated) : 73 Adjusted Body Weight:   Vital Signs: Temp: 100.8 F (38.2 C) (04/21 1415) Temp Source: Oral (04/21 1415) BP: 106/70 mmHg (04/21 1600) Pulse Rate: 114 (04/21 1230) Intake/Output from previous day:   Intake/Output from this shift:    Labs:  Recent Labs  12/08/14 1158  WBC 15.1*  HGB 12.6*  PLT 196  CREATININE 1.02   Estimated Creatinine Clearance: 68.6 mL/min (by C-G formula based on Cr of 1.02). No results for input(s): VANCOTROUGH, VANCOPEAK, VANCORANDOM, GENTTROUGH, GENTPEAK, GENTRANDOM, TOBRATROUGH, TOBRAPEAK, TOBRARND, AMIKACINPEAK, AMIKACINTROU, AMIKACIN in the last 72 hours.   Microbiology: Recent Results (from the past 720 hour(s))  Blood culture (routine x 2)     Status: None (Preliminary result)   Collection Time: 12/08/14  2:22 PM  Result Value Ref Range Status   Specimen Description BLOOD LEFT ANTECUBITAL  Final   Special Requests BOTTLES DRAWN AEROBIC AND ANAEROBIC 8CC  Final   Culture PENDING  Incomplete   Report Status PENDING  Incomplete  Blood culture (routine x 2)     Status: None (Preliminary result)   Collection Time: 12/08/14  2:30 PM  Result Value Ref Range Status   Specimen Description BLOOD RIGHT ANTECUBITAL  Final   Special Requests   Final    BOTTLES DRAWN AEROBIC AND ANAEROBIC AEB=8CC ANA=6CC   Culture PENDING  Incomplete   Report Status PENDING  Incomplete    Medical History: Past Medical History  Diagnosis Date  . High cholesterol   . GERD (gastroesophageal reflux disease)   . Hypertension   . Alzheimer disease   . Diabetes mellitus without complication   . Schizophrenia   . Dementia   . Overactive bladder     Medications:  Scheduled:   Assessment: Nursing home patient. Chest x-ray concern for pneumonia CrCl  68.6 ml/min   Goal of Therapy:  Vancomycin trough level 15-20 mcg/ml  Plan:  Vancomycin 1 GM IV every 12 hours Vancomycin trough at steady state Monitor renal function Labs per protocol  Josephine Igoittman, Markelle Asaro Bennett 12/08/2014,4:03 PM

## 2014-12-08 NOTE — ED Notes (Signed)
Report given to Marcelino DusterMichelle, RN for room 319

## 2014-12-09 ENCOUNTER — Inpatient Hospital Stay (HOSPITAL_COMMUNITY): Payer: Medicare Other

## 2014-12-09 LAB — GLUCOSE, CAPILLARY
GLUCOSE-CAPILLARY: 106 mg/dL — AB (ref 70–99)
GLUCOSE-CAPILLARY: 198 mg/dL — AB (ref 70–99)
Glucose-Capillary: 176 mg/dL — ABNORMAL HIGH (ref 70–99)
Glucose-Capillary: 156 mg/dL — ABNORMAL HIGH (ref 70–99)

## 2014-12-09 LAB — HIV ANTIBODY (ROUTINE TESTING W REFLEX): HIV SCREEN 4TH GENERATION: NONREACTIVE

## 2014-12-09 LAB — COMPREHENSIVE METABOLIC PANEL
ALT: 35 U/L (ref 0–53)
AST: 49 U/L — AB (ref 0–37)
Albumin: 2.9 g/dL — ABNORMAL LOW (ref 3.5–5.2)
Alkaline Phosphatase: 69 U/L (ref 39–117)
Anion gap: 6 (ref 5–15)
BILIRUBIN TOTAL: 0.4 mg/dL (ref 0.3–1.2)
BUN: 17 mg/dL (ref 6–23)
CHLORIDE: 105 mmol/L (ref 96–112)
CO2: 30 mmol/L (ref 19–32)
CREATININE: 0.96 mg/dL (ref 0.50–1.35)
Calcium: 8.4 mg/dL (ref 8.4–10.5)
GFR calc Af Amer: >90 mL/min (ref >90)
GFR, EST NON AFRICAN AMERICAN: 81 mL/min — AB (ref >90)
GLUCOSE: 140 mg/dL — AB (ref 70–99)
Potassium: 4 mmol/L (ref 3.5–5.1)
Sodium: 141 mmol/L (ref 135–145)
Total Protein: 6 g/dL (ref 6.0–8.3)

## 2014-12-09 LAB — CBC
HCT: 35.1 % — ABNORMAL LOW (ref 39.0–52.0)
HEMOGLOBIN: 11.5 g/dL — AB (ref 13.0–17.0)
MCH: 36.2 pg — ABNORMAL HIGH (ref 26.0–34.0)
MCHC: 32.8 g/dL (ref 30.0–36.0)
MCV: 110.4 fL — AB (ref 78.0–100.0)
Platelets: 189 10*3/uL (ref 150–400)
RBC: 3.18 MIL/uL — AB (ref 4.22–5.81)
RDW: 15.7 % — ABNORMAL HIGH (ref 11.5–15.5)
WBC: 12.1 10*3/uL — ABNORMAL HIGH (ref 4.0–10.5)

## 2014-12-09 LAB — URINE CULTURE
CULTURE: NO GROWTH
Colony Count: NO GROWTH

## 2014-12-09 LAB — STREP PNEUMONIAE URINARY ANTIGEN: Strep Pneumo Urinary Antigen: NEGATIVE

## 2014-12-09 MED ORDER — ACETAMINOPHEN 325 MG PO TABS
650.0000 mg | ORAL_TABLET | Freq: Four times a day (QID) | ORAL | Status: DC | PRN
  Administered 2014-12-09: 650 mg via ORAL
  Filled 2014-12-09: qty 2

## 2014-12-09 NOTE — Progress Notes (Signed)
Pt became extremely agitated when staff attempted to complete lower extremity ultrasound.  Staff unable to complete test.  Dr. Juanetta GoslingHawkins paged and made aware.  New order to cancel lower extremity ultrasound.  Will continue to monitor.

## 2014-12-09 NOTE — Progress Notes (Signed)
Subjective: He is here with pneumonia. Communication with him is very difficult even at baseline. He is also noted to have significant swelling and erythema of his left leg. When I ask him how he feels this morning he just shakes his head.  Objective: Vital signs in last 24 hours: Temp:  [98.6 F (37 C)-101.1 F (38.4 C)] 101.1 F (38.4 C) (04/22 0603) Pulse Rate:  [89-119] 91 (04/22 0603) Resp:  [14-25] 14 (04/22 0603) BP: (93-140)/(52-112) 121/59 mmHg (04/22 0603) SpO2:  [96 %-99 %] 99 % (04/22 0603) Weight:  [86.183 kg (190 lb)-94.348 kg (208 lb)] 94.348 kg (208 lb) (04/21 1800) Weight change:  Last BM Date: 12/08/14 (per patient)  Intake/Output from previous day:    PHYSICAL EXAM General appearance: alert and Frequently engaging in nonsensical conversations with himself Resp: rhonchi bilaterally Cardio: regular rate and rhythm, S1, S2 normal, no murmur, click, rub or gallop GI: soft, non-tender; bowel sounds normal; no masses,  no organomegaly Extremities: His left lower extremity is swollen and erythematous. I don't feel a definite cord.  Lab Results:  Results for orders placed or performed during the hospital encounter of 12/08/14 (from the past 48 hour(s))  CBC with Differential     Status: Abnormal   Collection Time: 12/08/14 11:58 AM  Result Value Ref Range   WBC 15.1 (H) 4.0 - 10.5 K/uL   RBC 3.55 (L) 4.22 - 5.81 MIL/uL   Hemoglobin 12.6 (L) 13.0 - 17.0 g/dL   HCT 38.9 (L) 39.0 - 52.0 %   MCV 109.6 (H) 78.0 - 100.0 fL   MCH 35.5 (H) 26.0 - 34.0 pg   MCHC 32.4 30.0 - 36.0 g/dL   RDW 15.6 (H) 11.5 - 15.5 %   Platelets 196 150 - 400 K/uL   Neutrophils Relative % 80 (H) 43 - 77 %   Neutro Abs 12.1 (H) 1.7 - 7.7 K/uL   Lymphocytes Relative 11 (L) 12 - 46 %   Lymphs Abs 1.6 0.7 - 4.0 K/uL   Monocytes Relative 9 3 - 12 %   Monocytes Absolute 1.3 (H) 0.1 - 1.0 K/uL   Eosinophils Relative 0 0 - 5 %   Eosinophils Absolute 0.0 0.0 - 0.7 K/uL   Basophils Relative 0 0 -  1 %   Basophils Absolute 0.0 0.0 - 0.1 K/uL  Comprehensive metabolic panel     Status: Abnormal   Collection Time: 12/08/14 11:58 AM  Result Value Ref Range   Sodium 137 135 - 145 mmol/L   Potassium 3.8 3.5 - 5.1 mmol/L   Chloride 102 96 - 112 mmol/L   CO2 27 19 - 32 mmol/L   Glucose, Bld 266 (H) 70 - 99 mg/dL   BUN 17 6 - 23 mg/dL   Creatinine, Ser 1.02 0.50 - 1.35 mg/dL   Calcium 8.8 8.4 - 10.5 mg/dL   Total Protein 6.8 6.0 - 8.3 g/dL   Albumin 3.5 3.5 - 5.2 g/dL   AST 72 (H) 0 - 37 U/L   ALT 39 0 - 53 U/L   Alkaline Phosphatase 76 39 - 117 U/L   Total Bilirubin 0.4 0.3 - 1.2 mg/dL   GFR calc non Af Amer 72 (L) >90 mL/min   GFR calc Af Amer 83 (L) >90 mL/min    Comment: (NOTE) The eGFR has been calculated using the CKD EPI equation. This calculation has not been validated in all clinical situations. eGFR's persistently <90 mL/min signify possible Chronic Kidney Disease.    Anion  gap 8 5 - 15  Urinalysis, Routine w reflex microscopic     Status: Abnormal   Collection Time: 12/08/14 12:10 PM  Result Value Ref Range   Color, Urine YELLOW YELLOW   APPearance CLEAR CLEAR   Specific Gravity, Urine 1.020 1.005 - 1.030   pH 6.0 5.0 - 8.0   Glucose, UA >1000 (A) NEGATIVE mg/dL   Hgb urine dipstick SMALL (A) NEGATIVE   Bilirubin Urine NEGATIVE NEGATIVE   Ketones, ur NEGATIVE NEGATIVE mg/dL   Protein, ur TRACE (A) NEGATIVE mg/dL   Urobilinogen, UA 0.2 0.0 - 1.0 mg/dL   Nitrite NEGATIVE NEGATIVE   Leukocytes, UA NEGATIVE NEGATIVE  Urine microscopic-add on     Status: Abnormal   Collection Time: 12/08/14 12:10 PM  Result Value Ref Range   Squamous Epithelial / LPF FEW (A) RARE   WBC, UA 0-2 <3 WBC/hpf   RBC / HPF 0-2 <3 RBC/hpf   Bacteria, UA FEW (A) RARE  Blood culture (routine x 2)     Status: None (Preliminary result)   Collection Time: 12/08/14  2:22 PM  Result Value Ref Range   Specimen Description BLOOD LEFT ANTECUBITAL    Special Requests BOTTLES DRAWN AEROBIC AND  ANAEROBIC 8CC    Culture NO GROWTH <24 HRS    Report Status PENDING   Lactic acid, plasma     Status: None   Collection Time: 12/08/14  2:22 PM  Result Value Ref Range   Lactic Acid, Venous 2.0 0.5 - 2.0 mmol/L  Blood culture (routine x 2)     Status: None (Preliminary result)   Collection Time: 12/08/14  2:30 PM  Result Value Ref Range   Specimen Description BLOOD RIGHT ANTECUBITAL    Special Requests      BOTTLES DRAWN AEROBIC AND ANAEROBIC AEB=8CC ANA=6CC   Culture NO GROWTH <24 HRS    Report Status PENDING   HIV antibody     Status: None   Collection Time: 12/08/14  4:34 PM  Result Value Ref Range   HIV Screen 4th Generation wRfx Non Reactive Non Reactive    Comment: (NOTE) Performed At: [facility] [place], [place] [id number] Lindon Romp MD DQ:[id number]   Glucose, capillary     Status: Abnormal   Collection Time: 12/08/14  6:36 PM  Result Value Ref Range   Glucose-Capillary 124 (H) 70 - 99 mg/dL  Glucose, capillary     Status: Abnormal   Collection Time: 12/08/14  8:46 PM  Result Value Ref Range   Glucose-Capillary 185 (H) 70 - 99 mg/dL   Comment 1 Notify RN    Comment 2 Document in Chart   Comprehensive metabolic panel     Status: Abnormal   Collection Time: 12/09/14  5:32 AM  Result Value Ref Range   Sodium 141 135 - 145 mmol/L   Potassium 4.0 3.5 - 5.1 mmol/L   Chloride 105 96 - 112 mmol/L   CO2 30 19 - 32 mmol/L   Glucose, Bld 140 (H) 70 - 99 mg/dL   BUN 17 6 - 23 mg/dL   Creatinine, Ser 0.96 0.50 - 1.35 mg/dL   Calcium 8.4 8.4 - 10.5 mg/dL   Total Protein 6.0 6.0 - 8.3 g/dL   Albumin 2.9 (L) 3.5 - 5.2 g/dL   AST 49 (H) 0 - 37 U/L   ALT 35 0 - 53 U/L   Alkaline Phosphatase 69 39 - 117 U/L   Total Bilirubin 0.4 0.3 - 1.2 mg/dL  GFR calc non Af Amer 81 (L) >90 mL/min   GFR calc Af Amer >90 >90 mL/min    Comment: (NOTE) The eGFR has been calculated using the CKD EPI equation. This calculation has not been validated in all  clinical situations. eGFR's persistently <90 mL/min signify possible Chronic Kidney Disease.    Anion gap 6 5 - 15  CBC     Status: Abnormal   Collection Time: 12/09/14  5:32 AM  Result Value Ref Range   WBC 12.1 (H) 4.0 - 10.5 K/uL   RBC 3.18 (L) 4.22 - 5.81 MIL/uL   Hemoglobin 11.5 (L) 13.0 - 17.0 g/dL   HCT 35.1 (L) 39.0 - 52.0 %   MCV 110.4 (H) 78.0 - 100.0 fL   MCH 36.2 (H) 26.0 - 34.0 pg   MCHC 32.8 30.0 - 36.0 g/dL   RDW 15.7 (H) 11.5 - 15.5 %   Platelets 189 150 - 400 K/uL  Glucose, capillary     Status: Abnormal   Collection Time: 12/09/14  8:00 AM  Result Value Ref Range   Glucose-Capillary 176 (H) 70 - 99 mg/dL   Comment 1 Notify RN     ABGS No results for input(s): PHART, PO2ART, TCO2, HCO3 in the last 72 hours.  Invalid input(s): PCO2 CULTURES Recent Results (from the past 240 hour(s))  Blood culture (routine x 2)     Status: None (Preliminary result)   Collection Time: 12/08/14  2:22 PM  Result Value Ref Range Status   Specimen Description BLOOD LEFT ANTECUBITAL  Final   Special Requests BOTTLES DRAWN AEROBIC AND ANAEROBIC 8CC  Final   Culture NO GROWTH <24 HRS  Final   Report Status PENDING  Incomplete  Blood culture (routine x 2)     Status: None (Preliminary result)   Collection Time: 12/08/14  2:30 PM  Result Value Ref Range Status   Specimen Description BLOOD RIGHT ANTECUBITAL  Final   Special Requests   Final    BOTTLES DRAWN AEROBIC AND ANAEROBIC AEB=8CC ANA=6CC   Culture NO GROWTH <24 HRS  Final   Report Status PENDING  Incomplete   Studies/Results: Dg Chest 2 View  12/08/2014   CLINICAL DATA:  Back pain, fever.  EXAM: CHEST  2 VIEW  COMPARISON:  September 05, 2014.  FINDINGS: Stable cardiomediastinal silhouette. No pneumothorax or significant pleural effusion is noted. Stable eventration of the left hemidiaphragm is noted. Mild right basilar subsegmental atelectasis or pneumonia is noted. Bony thorax is intact.  IMPRESSION: Mild right basilar  subsegmental atelectasis or pneumonia. Short-term follow-up radiographs are recommended until resolution.   Electronically Signed   By: Marijo Conception, M.D.   On: 12/08/2014 14:35   Ct Head Wo Contrast  12/08/2014   CLINICAL DATA:  Low back pain for 1 day.  Altered mental status.  EXAM: CT HEAD WITHOUT CONTRAST  TECHNIQUE: Contiguous axial images were obtained from the base of the skull through the vertex without intravenous contrast.  COMPARISON:  CT head 10/16/2008.  FINDINGS: The patient was unable to remain motionless for the exam. Small or subtle lesions could be overlooked.  No evidence for acute infarction, hemorrhage, mass lesion, hydrocephalus, or extra-axial fluid. Mild cerebral and cerebellar atrophy. Hypoattenuation of white matter suggesting small vessel disease. Calvarium intact. Old medial blowout fracture RIGHT orbit. Vascular calcification. No acute sinus or mastoid disease.  IMPRESSION: Motion degraded exam. No definite acute intracranial findings. Similar appearance to prior CT.  Evidence for old RIGHT orbital trauma.  Electronically Signed   By: Rolla Flatten M.D.   On: 12/08/2014 15:10   Ct Renal Stone Study  12/08/2014   CLINICAL DATA:  Low back pain  for 1 day with fever.  EXAM: CT ABDOMEN AND PELVIS WITHOUT CONTRAST  TECHNIQUE: Multidetector CT imaging of the abdomen and pelvis was performed following the standard protocol without IV contrast.  COMPARISON:  CT AP with contrast 12/25/2013.  FINDINGS: COPD changes lung bases. Mild RIGHT basilar scarring. These are stable from priors. Marked elevation LEFT hemidiaphragm. No intrarenal or proximal ureteral calculi on either side. No evidence of hydronephrosis or other secondary signs of upper urinary tract obstruction. Within limits of unenhanced technique, normal appearing renal cortex. BILATERAL renal cystic disease appears grossly stable compared with prior CT abdomen pelvis with contrast performed 12/25/2013.  Again, within limits of  unenhanced technique, remaining visualized upper abdomen unremarkable. Visualized extreme lung bases clear. Stable RIGHT adrenal nodule, likely adenoma. Stable gallstones without signs of cholecystitis. No appendiceal inflammation.  No distal ureteral calculi on either side. Uncomplicated appearing umbilical hernia. Non aneurysmal atherosclerotic calcification of the aorta. Moderately thick-walled bladder uncertain significance. No worrisome osseous lesions.  IMPRESSION: Within limits of evaluation on noncontrast CT, stable BILATERAL renal cystic disease. No obstructive uropathy. Gallstones.   Electronically Signed   By: Rolla Flatten M.D.   On: 12/08/2014 15:07    Medications:  Prior to Admission:  Prescriptions prior to admission  Medication Sig Dispense Refill Last Dose  . bimatoprost (LUMIGAN) 0.01 % SOLN Place 1 drop into both eyes at bedtime.   12/07/2014 at Unknown time  . carbamazepine (TEGRETOL) 200 MG tablet Take 200 mg by mouth 3 (three) times daily.   12/08/2014 at Unknown time  . clonazePAM (KLONOPIN) 0.5 MG tablet Take 0.5 mg by mouth 3 (three) times daily.   12/08/2014 at Unknown time  . donepezil (ARICEPT) 10 MG tablet Take 10 mg by mouth at bedtime.   12/07/2014 at Unknown time  . folic acid (FOLVITE) 1 MG tablet Take 1 mg by mouth daily.   12/08/2014 at Unknown time  . insulin aspart (NOVOLOG) 100 UNIT/ML injection Inject 5 Units into the skin 3 (three) times daily before meals. If blood sugar is 90-150. Sliding scale: 150-200=1 unit; 201-250=2 units; 251-300=3 units; 301-350=4 units; 351-400=5 units; Greater than 400=6 units & CALL DR.   12/08/2014 at Unknown time  . insulin detemir (LEVEMIR) 100 UNIT/ML injection Inject 30 Units into the skin daily.    12/08/2014 at Unknown time  . lisinopril (PRINIVIL,ZESTRIL) 10 MG tablet Take 10 mg by mouth daily.   12/08/2014 at Unknown time  . memantine (NAMENDA) 10 MG tablet Take 10 mg by mouth 2 (two) times daily.   12/08/2014 at Unknown time  .  metFORMIN (GLUCOPHAGE) 500 MG tablet Take 500 mg by mouth 2 (two) times daily with a meal.   12/08/2014 at Unknown time  . OLANZapine (ZYPREXA) 10 MG tablet Take 10 mg by mouth at bedtime.   12/07/2014 at Unknown time  . omeprazole (PRILOSEC) 20 MG capsule Take 20 mg by mouth daily.   12/08/2014 at Unknown time  . oxybutynin (DITROPAN) 5 MG tablet Take 5 mg by mouth 2 (two) times daily.   12/08/2014 at Unknown time  . senna (SENOKOT) 8.6 MG TABS tablet Take 1 tablet by mouth at bedtime.   12/07/2014 at Unknown time  . simvastatin (ZOCOR) 40 MG tablet Take 40 mg by mouth every evening.   12/07/2014 at Unknown time  .  tamsulosin (FLOMAX) 0.4 MG CAPS Take 0.4 mg by mouth daily.   12/08/2014 at Unknown time  . Vitamin D, Ergocalciferol, (DRISDOL) 50000 UNITS CAPS capsule Take 50,000 Units by mouth every 7 (seven) days.   Past Week at Unknown time   Scheduled: . carbamazepine  200 mg Oral TID  . ceFEPime (MAXIPIME) IV  1 g Intravenous Q8H  . clonazePAM  0.5 mg Oral TID  . donepezil  10 mg Oral QHS  . folic acid  1 mg Oral Daily  . heparin  5,000 Units Subcutaneous 3 times per day  . insulin aspart  0-20 Units Subcutaneous TID WC  . insulin aspart  0-5 Units Subcutaneous QHS  . insulin detemir  30 Units Subcutaneous Daily  . latanoprost  1 drop Both Eyes QHS  . lisinopril  10 mg Oral Daily  . memantine  10 mg Oral BID  . metFORMIN  500 mg Oral BID WC  . OLANZapine  10 mg Oral QHS  . oxybutynin  5 mg Oral BID  . pantoprazole  40 mg Oral Daily  . senna  1 tablet Oral QHS  . simvastatin  40 mg Oral QPM  . tamsulosin  0.4 mg Oral Daily  . vancomycin  1,000 mg Intravenous Q12H  . [START ON 12/12/2014] Vitamin D (Ergocalciferol)  50,000 Units Oral Q7 days   Continuous:  [id number]  Assesment: He is admitted with healthcare associated pneumonia. He still has some chest congestion. He is on appropriate antibiotics.  He has dementia and schizophrenia and his ability to communicate is severely  compromised.  He has hypertension which is pretty well controlled.  He has diabetes and has recently had more trouble with controlling his blood sugar but it seems better now.  He has swelling and erythema of his left leg and he will have a venous ultrasound to see if he has a clot. If this is cellulitis his current treatment for pneumonia should be adequate for the Active Problems:   HAP (hospital-acquired pneumonia)   Diabetes   Dementia   Schizophrenia   Hypertension   Hospital-acquired pneumonia    Plan: Continue current treatments    LOS: 1 day   Rhiana Morash L 12/09/2014, 8:41 AM

## 2014-12-09 NOTE — Clinical Social Work Note (Signed)
Clinical Social Work Assessment  Patient Details  Name: Andre Rowe MRN: [id number] Date of Birth: 1941-03-28  Date of referral:  12/09/14               Reason for consult:  Facility Placement                Permission sought to share information with:    Permission granted to share information::     Name::        Agency::   (Andre Rowe contacted [employer] with [facility] as she was the only person listed on the chart. )  Relationship::     Contact Information:     Housing/Transportation Living arrangements for the past 2 months:  [place] of Information:  Facility Patient Interpreter Needed:  None Criminal Activity/Legal Involvement Pertinent to Current Situation/Hospitalization:  No - Comment as needed Significant Relationships:  None Lives with:  Facility Resident Do you feel safe going back to the place where you live?  Yes Need for family participation in patient care:  No (Coment)  Care giving concerns:  None identified   Facilities manager / plan:  CSW met with patient who was alert and disorented.  He was only oriented to self.  Patient spoke with Andre Rowe with [facility].  She advised that patient had been in the facility since 2007.  She stated that at baseline, patient walks, runs, rides a bike unassisted.  She stated that the facility assist him with bathing, but he completes the rest of his ADLs independently.  Ms. Andre Rowe indicated that patient has no family that visits with him. She indicated that a relative usually sends him a birthday card.  She indicated that patient does not have a guardian, and that she has attempted to speak with them about obtaining guardianship for patient in the past to no avail.  She stated that since patient has been in the facility, she has only met one relative years ago, who identified herself as his niece.  Ms. Andre Rowe indicated that patient can come back to the facility upon discharge.     Employment status:  Disabled (Comment on whether or not currently receiving Disability) Insurance information:  Medicare, Medicaid In [place] PT Recommendations:    Information / Referral to community resources:     Patient/Family's Response to care:  No family involvement.  Patient/Family's Understanding of and Emotional Response to Diagnosis, Current Treatment, and Prognosis:  Facility understands and is agreeable.   Emotional Assessment Appearance:  Well-Groomed Attitude/Demeanor/Rapport:   (Disoriented) Affect (typically observed):  Frustrated Orientation:  Oriented to Self Alcohol / Substance use:  Tobacco Use Psych involvement (Current and /or in the community):  No (Comment)  Discharge Needs  Concerns to be addressed:  Discharge Planning Concerns Readmission within the last 30 days:  No Current discharge risk:  None Barriers to Discharge:  No Barriers Identified   Andre Gully, LCSW 12/09/2014, 12:54 PM

## 2014-12-09 NOTE — Care Management Note (Addendum)
    Page 1 of 1   12/12/2014     11:46:22 AM CARE MANAGEMENT NOTE 12/12/2014  Patient:  Andre Rowe,Andre Rowe   Account Number:  [id number]  Date Initiated:  12/09/2014  Documentation initiated by:  Kathyrn SheriffHILDRESS,JESSICA  Subjective/Objective Assessment:   Pt admitted with HCAP. [facility] Anticiapte return to facility at discharge. CSW is aware and will arrange for return to facility. No CM needs.     Action/Plan:   Anticipated DC Date:  12/12/2014   Anticipated DC Plan:  ASSISTED LIVING / REST HOME  In-house referral  Clinical Social Worker      DC Planning Services  CM consult      Choice offered to / List presented to:             Status of service:  Completed, signed off Medicare Important Message given?  YES (If response is "NO", the following Medicare IM given date fields will be blank) Date Medicare IM given:  12/09/2014 Medicare IM given by:  Kathyrn SheriffHILDRESS,JESSICA Date Additional Medicare IM given:  12/12/2014 Additional Medicare IM given by:  JESSICA CHILDRESS  Discharge Disposition:  ASSISTED LIVING  Per UR Regulation:  Reviewed for med. necessity/level of care/duration of stay  If discussed at Long Length of Stay Meetings, dates discussed:    Comments:  12/12/2014 1145 Kathyrn SheriffJessica Childress, RN, MSN, CM Pt discharging today, returning to ALF. No CM needs. 12/09/2014 0845 Kathyrn SheriffJessica Childress, RN, MSN, CM

## 2014-12-09 NOTE — Care Management Utilization Note (Signed)
UR completed 

## 2014-12-10 LAB — GLUCOSE, CAPILLARY
Glucose-Capillary: 158 mg/dL — ABNORMAL HIGH (ref 70–99)
Glucose-Capillary: 109 mg/dL — ABNORMAL HIGH (ref 70–99)
Glucose-Capillary: 153 mg/dL — ABNORMAL HIGH (ref 70–99)
Glucose-Capillary: 117 mg/dL — ABNORMAL HIGH (ref 70–99)

## 2014-12-10 NOTE — Progress Notes (Signed)
Subjective: He looks a little better. He is sleepy but arousable. He refused the ultrasound yesterday  Objective: Vital signs in last 24 hours: Temp:  [98.1 F (36.7 C)-99.9 F (37.7 C)] 98.1 F (36.7 C) (04/23 0645) Pulse Rate:  [88-102] 99 (04/23 0645) Resp:  [16-20] 20 (04/23 0645) BP: (106-145)/(67-81) 145/81 mmHg (04/23 0645) SpO2:  [92 %-100 %] 100 % (04/23 0645) Weight:  [92.1 kg (203 lb 0.7 oz)] 92.1 kg (203 lb 0.7 oz) (04/23 0645) Weight change: 5.917 kg (13 lb 0.7 oz) Last BM Date: 12/08/14 (per patient)  Intake/Output from previous day: 04/22 0701 - 04/23 0700 In: 550 [IV Piggyback:550] Out: 1650 [Urine:1650]  PHYSICAL EXAM General appearance: alert and Confused Resp: rhonchi bilaterally Cardio: regular rate and rhythm, S1, S2 normal, no murmur, click, rub or gallop GI: soft, non-tender; bowel sounds normal; no masses,  no organomegaly Extremities: His left lower extremity is still erythematous but looks better  Lab Results:  Results for orders placed or performed during the hospital encounter of 12/08/14 (from the past 48 hour(s))  CBC with Differential     Status: Abnormal   Collection Time: 12/08/14 11:58 AM  Result Value Ref Range   WBC 15.1 (H) 4.0 - 10.5 K/uL   RBC 3.55 (L) 4.22 - 5.81 MIL/uL   Hemoglobin 12.6 (L) 13.0 - 17.0 g/dL   HCT 38.9 (L) 39.0 - 52.0 %   MCV 109.6 (H) 78.0 - 100.0 fL   MCH 35.5 (H) 26.0 - 34.0 pg   MCHC 32.4 30.0 - 36.0 g/dL   RDW 15.6 (H) 11.5 - 15.5 %   Platelets 196 150 - 400 K/uL   Neutrophils Relative % 80 (H) 43 - 77 %   Neutro Abs 12.1 (H) 1.7 - 7.7 K/uL   Lymphocytes Relative 11 (L) 12 - 46 %   Lymphs Abs 1.6 0.7 - 4.0 K/uL   Monocytes Relative 9 3 - 12 %   Monocytes Absolute 1.3 (H) 0.1 - 1.0 K/uL   Eosinophils Relative 0 0 - 5 %   Eosinophils Absolute 0.0 0.0 - 0.7 K/uL   Basophils Relative 0 0 - 1 %   Basophils Absolute 0.0 0.0 - 0.1 K/uL  Comprehensive metabolic panel     Status: Abnormal   Collection Time:  12/08/14 11:58 AM  Result Value Ref Range   Sodium 137 135 - 145 mmol/L   Potassium 3.8 3.5 - 5.1 mmol/L   Chloride 102 96 - 112 mmol/L   CO2 27 19 - 32 mmol/L   Glucose, Bld 266 (H) 70 - 99 mg/dL   BUN 17 6 - 23 mg/dL   Creatinine, Ser 1.02 0.50 - 1.35 mg/dL   Calcium 8.8 8.4 - 10.5 mg/dL   Total Protein 6.8 6.0 - 8.3 g/dL   Albumin 3.5 3.5 - 5.2 g/dL   AST 72 (H) 0 - 37 U/L   ALT 39 0 - 53 U/L   Alkaline Phosphatase 76 39 - 117 U/L   Total Bilirubin 0.4 0.3 - 1.2 mg/dL   GFR calc non Af Amer 72 (L) >90 mL/min   GFR calc Af Amer 83 (L) >90 mL/min    Comment: (NOTE) The eGFR has been calculated using the CKD EPI equation. This calculation has not been validated in all clinical situations. eGFR's persistently <90 mL/min signify possible Chronic Kidney Disease.    Anion gap 8 5 - 15  Urinalysis, Routine w reflex microscopic     Status: Abnormal   Collection Time:  12/08/14 12:10 PM  Result Value Ref Range   Color, Urine YELLOW YELLOW   APPearance CLEAR CLEAR   Specific Gravity, Urine 1.020 1.005 - 1.030   pH 6.0 5.0 - 8.0   Glucose, UA >1000 (A) NEGATIVE mg/dL   Hgb urine dipstick SMALL (A) NEGATIVE   Bilirubin Urine NEGATIVE NEGATIVE   Ketones, ur NEGATIVE NEGATIVE mg/dL   Protein, ur TRACE (A) NEGATIVE mg/dL   Urobilinogen, UA 0.2 0.0 - 1.0 mg/dL   Nitrite NEGATIVE NEGATIVE   Leukocytes, UA NEGATIVE NEGATIVE  Urine microscopic-add on     Status: Abnormal   Collection Time: 12/08/14 12:10 PM  Result Value Ref Range   Squamous Epithelial / LPF FEW (A) RARE   WBC, UA 0-2 <3 WBC/hpf   RBC / HPF 0-2 <3 RBC/hpf   Bacteria, UA FEW (A) RARE  Urine culture     Status: None   Collection Time: 12/08/14 12:10 PM  Result Value Ref Range   Specimen Description URINE, CATHETERIZED    Special Requests NONE    Colony Count NO GROWTH Performed at [facility]     Culture NO GROWTH Performed at [facility]     Report Status 12/09/2014 FINAL   Strep pneumoniae  urinary antigen     Status: None   Collection Time: 12/08/14 12:10 PM  Result Value Ref Range   Strep Pneumo Urinary Antigen NEGATIVE NEGATIVE    Comment: Performed at [facility]  Blood culture (routine x 2)     Status: None (Preliminary result)   Collection Time: 12/08/14  2:22 PM  Result Value Ref Range   Specimen Description BLOOD LEFT ANTECUBITAL    Special Requests BOTTLES DRAWN AEROBIC AND ANAEROBIC 8CC    Culture NO GROWTH 2 DAYS    Report Status PENDING   Lactic acid, plasma     Status: None   Collection Time: 12/08/14  2:22 PM  Result Value Ref Range   Lactic Acid, Venous 2.0 0.5 - 2.0 mmol/L  Blood culture (routine x 2)     Status: None (Preliminary result)   Collection Time: 12/08/14  2:30 PM  Result Value Ref Range   Specimen Description BLOOD RIGHT ANTECUBITAL    Special Requests      BOTTLES DRAWN AEROBIC AND ANAEROBIC AEB=8CC ANA=6CC   Culture NO GROWTH 2 DAYS    Report Status PENDING   HIV antibody     Status: None   Collection Time: 12/08/14  4:34 PM  Result Value Ref Range   HIV Screen 4th Generation wRfx Non Reactive Non Reactive    Comment: (NOTE) Performed At: [facility] [place], [place] [id number] Lindon Romp MD DX:[id number]   Culture, blood (routine x 2) Call MD if unable to obtain prior to antibiotics being given     Status: None (Preliminary result)   Collection Time: 12/08/14  4:34 PM  Result Value Ref Range   Specimen Description BLOOD LEFT ANTECUBITAL    Special Requests      BOTTLES DRAWN AEROBIC AND ANAEROBIC AEB=7CC ANA=6CC   Culture NO GROWTH 2 DAYS    Report Status PENDING   Culture, blood (routine x 2) Call MD if unable to obtain prior to antibiotics being given     Status: None (Preliminary result)   Collection Time: 12/08/14  4:40 PM  Result Value Ref Range   Specimen Description BLOOD RIGHT FOREARM    Special Requests BOTTLES DRAWN AEROBIC AND ANAEROBIC 6CC  Culture NO GROWTH 2 DAYS    Report  Status PENDING   Glucose, capillary     Status: Abnormal   Collection Time: 12/08/14  6:36 PM  Result Value Ref Range   Glucose-Capillary 124 (H) 70 - 99 mg/dL  Glucose, capillary     Status: Abnormal   Collection Time: 12/08/14  8:46 PM  Result Value Ref Range   Glucose-Capillary 185 (H) 70 - 99 mg/dL   Comment 1 Notify RN    Comment 2 Document in Chart   Comprehensive metabolic panel     Status: Abnormal   Collection Time: 12/09/14  5:32 AM  Result Value Ref Range   Sodium 141 135 - 145 mmol/L   Potassium 4.0 3.5 - 5.1 mmol/L   Chloride 105 96 - 112 mmol/L   CO2 30 19 - 32 mmol/L   Glucose, Bld 140 (H) 70 - 99 mg/dL   BUN 17 6 - 23 mg/dL   Creatinine, Ser 0.96 0.50 - 1.35 mg/dL   Calcium 8.4 8.4 - 10.5 mg/dL   Total Protein 6.0 6.0 - 8.3 g/dL   Albumin 2.9 (L) 3.5 - 5.2 g/dL   AST 49 (H) 0 - 37 U/L   ALT 35 0 - 53 U/L   Alkaline Phosphatase 69 39 - 117 U/L   Total Bilirubin 0.4 0.3 - 1.2 mg/dL   GFR calc non Af Amer 81 (L) >90 mL/min   GFR calc Af Amer >90 >90 mL/min    Comment: (NOTE) The eGFR has been calculated using the CKD EPI equation. This calculation has not been validated in all clinical situations. eGFR's persistently <90 mL/min signify possible Chronic Kidney Disease.    Anion gap 6 5 - 15  CBC     Status: Abnormal   Collection Time: 12/09/14  5:32 AM  Result Value Ref Range   WBC 12.1 (H) 4.0 - 10.5 K/uL   RBC 3.18 (L) 4.22 - 5.81 MIL/uL   Hemoglobin 11.5 (L) 13.0 - 17.0 g/dL   HCT 35.1 (L) 39.0 - 52.0 %   MCV 110.4 (H) 78.0 - 100.0 fL   MCH 36.2 (H) 26.0 - 34.0 pg   MCHC 32.8 30.0 - 36.0 g/dL   RDW 15.7 (H) 11.5 - 15.5 %   Platelets 189 150 - 400 K/uL  Glucose, capillary     Status: Abnormal   Collection Time: 12/09/14  8:00 AM  Result Value Ref Range   Glucose-Capillary 176 (H) 70 - 99 mg/dL   Comment 1 Notify RN   Glucose, capillary     Status: Abnormal   Collection Time: 12/09/14 12:21 PM  Result Value Ref Range   Glucose-Capillary 156 (H) 70  - 99 mg/dL   Comment 1 Notify RN   Glucose, capillary     Status: Abnormal   Collection Time: 12/09/14  4:46 PM  Result Value Ref Range   Glucose-Capillary 106 (H) 70 - 99 mg/dL   Comment 1 Notify RN   Glucose, capillary     Status: Abnormal   Collection Time: 12/09/14  9:11 PM  Result Value Ref Range   Glucose-Capillary 198 (H) 70 - 99 mg/dL   Comment 1 Notify RN    Comment 2 Document in Chart   Glucose, capillary     Status: Abnormal   Collection Time: 12/10/14  7:55 AM  Result Value Ref Range   Glucose-Capillary 158 (H) 70 - 99 mg/dL   Comment 1 Notify RN     ABGS No results  for input(s): PHART, PO2ART, TCO2, HCO3 in the last 72 hours.  Invalid input(s): PCO2 CULTURES Recent Results (from the past 240 hour(s))  Urine culture     Status: None   Collection Time: 12/08/14 12:10 PM  Result Value Ref Range Status   Specimen Description URINE, CATHETERIZED  Final   Special Requests NONE  Final   Colony Count NO GROWTH Performed at [facility]   Final   Culture NO GROWTH Performed at [facility]   Final   Report Status 12/09/2014 FINAL  Final  Blood culture (routine x 2)     Status: None (Preliminary result)   Collection Time: 12/08/14  2:22 PM  Result Value Ref Range Status   Specimen Description BLOOD LEFT ANTECUBITAL  Final   Special Requests BOTTLES DRAWN AEROBIC AND ANAEROBIC 8CC  Final   Culture NO GROWTH 2 DAYS  Final   Report Status PENDING  Incomplete  Blood culture (routine x 2)     Status: None (Preliminary result)   Collection Time: 12/08/14  2:30 PM  Result Value Ref Range Status   Specimen Description BLOOD RIGHT ANTECUBITAL  Final   Special Requests   Final    BOTTLES DRAWN AEROBIC AND ANAEROBIC AEB=8CC ANA=6CC   Culture NO GROWTH 2 DAYS  Final   Report Status PENDING  Incomplete  Culture, blood (routine x 2) Call MD if unable to obtain prior to antibiotics being given     Status: None (Preliminary result)   Collection Time: 12/08/14   4:34 PM  Result Value Ref Range Status   Specimen Description BLOOD LEFT ANTECUBITAL  Final   Special Requests   Final    BOTTLES DRAWN AEROBIC AND ANAEROBIC AEB=7CC ANA=6CC   Culture NO GROWTH 2 DAYS  Final   Report Status PENDING  Incomplete  Culture, blood (routine x 2) Call MD if unable to obtain prior to antibiotics being given     Status: None (Preliminary result)   Collection Time: 12/08/14  4:40 PM  Result Value Ref Range Status   Specimen Description BLOOD RIGHT FOREARM  Final   Special Requests BOTTLES DRAWN AEROBIC AND ANAEROBIC 6CC  Final   Culture NO GROWTH 2 DAYS  Final   Report Status PENDING  Incomplete   Studies/Results: Dg Chest 2 View  12/08/2014   CLINICAL DATA:  Back pain, fever.  EXAM: CHEST  2 VIEW  COMPARISON:  September 05, 2014.  FINDINGS: Stable cardiomediastinal silhouette. No pneumothorax or significant pleural effusion is noted. Stable eventration of the left hemidiaphragm is noted. Mild right basilar subsegmental atelectasis or pneumonia is noted. Bony thorax is intact.  IMPRESSION: Mild right basilar subsegmental atelectasis or pneumonia. Short-term follow-up radiographs are recommended until resolution.   Electronically Signed   By: Marijo Conception, M.D.   On: 12/08/2014 14:35   Ct Head Wo Contrast  12/08/2014   CLINICAL DATA:  Low back pain for 1 day.  Altered mental status.  EXAM: CT HEAD WITHOUT CONTRAST  TECHNIQUE: Contiguous axial images were obtained from the base of the skull through the vertex without intravenous contrast.  COMPARISON:  CT head 10/16/2008.  FINDINGS: The patient was unable to remain motionless for the exam. Small or subtle lesions could be overlooked.  No evidence for acute infarction, hemorrhage, mass lesion, hydrocephalus, or extra-axial fluid. Mild cerebral and cerebellar atrophy. Hypoattenuation of white matter suggesting small vessel disease. Calvarium intact. Old medial blowout fracture RIGHT orbit. Vascular calcification. No acute  sinus or mastoid disease.  IMPRESSION: Motion degraded exam. No definite acute intracranial findings. Similar appearance to prior CT.  Evidence for old RIGHT orbital trauma.   Electronically Signed   By: Rolla Flatten M.D.   On: 12/08/2014 15:10   Ct Renal Stone Study  12/08/2014   CLINICAL DATA:  Low back pain  for 1 day with fever.  EXAM: CT ABDOMEN AND PELVIS WITHOUT CONTRAST  TECHNIQUE: Multidetector CT imaging of the abdomen and pelvis was performed following the standard protocol without IV contrast.  COMPARISON:  CT AP with contrast 12/25/2013.  FINDINGS: COPD changes lung bases. Mild RIGHT basilar scarring. These are stable from priors. Marked elevation LEFT hemidiaphragm. No intrarenal or proximal ureteral calculi on either side. No evidence of hydronephrosis or other secondary signs of upper urinary tract obstruction. Within limits of unenhanced technique, normal appearing renal cortex. BILATERAL renal cystic disease appears grossly stable compared with prior CT abdomen pelvis with contrast performed 12/25/2013.  Again, within limits of unenhanced technique, remaining visualized upper abdomen unremarkable. Visualized extreme lung bases clear. Stable RIGHT adrenal nodule, likely adenoma. Stable gallstones without signs of cholecystitis. No appendiceal inflammation.  No distal ureteral calculi on either side. Uncomplicated appearing umbilical hernia. Non aneurysmal atherosclerotic calcification of the aorta. Moderately thick-walled bladder uncertain significance. No worrisome osseous lesions.  IMPRESSION: Within limits of evaluation on noncontrast CT, stable BILATERAL renal cystic disease. No obstructive uropathy. Gallstones.   Electronically Signed   By: Rolla Flatten M.D.   On: 12/08/2014 15:07    Medications:  Prior to Admission:  Prescriptions prior to admission  Medication Sig Dispense Refill Last Dose  . bimatoprost (LUMIGAN) 0.01 % SOLN Place 1 drop into both eyes at bedtime.   12/07/2014 at  Unknown time  . carbamazepine (TEGRETOL) 200 MG tablet Take 200 mg by mouth 3 (three) times daily.   12/08/2014 at Unknown time  . clonazePAM (KLONOPIN) 0.5 MG tablet Take 0.5 mg by mouth 3 (three) times daily.   12/08/2014 at Unknown time  . donepezil (ARICEPT) 10 MG tablet Take 10 mg by mouth at bedtime.   12/07/2014 at Unknown time  . folic acid (FOLVITE) 1 MG tablet Take 1 mg by mouth daily.   12/08/2014 at Unknown time  . insulin aspart (NOVOLOG) 100 UNIT/ML injection Inject 5 Units into the skin 3 (three) times daily before meals. If blood sugar is 90-150. Sliding scale: 150-200=1 unit; 201-250=2 units; 251-300=3 units; 301-350=4 units; 351-400=5 units; Greater than 400=6 units & CALL DR.   12/08/2014 at Unknown time  . insulin detemir (LEVEMIR) 100 UNIT/ML injection Inject 30 Units into the skin daily.    12/08/2014 at Unknown time  . lisinopril (PRINIVIL,ZESTRIL) 10 MG tablet Take 10 mg by mouth daily.   12/08/2014 at Unknown time  . memantine (NAMENDA) 10 MG tablet Take 10 mg by mouth 2 (two) times daily.   12/08/2014 at Unknown time  . metFORMIN (GLUCOPHAGE) 500 MG tablet Take 500 mg by mouth 2 (two) times daily with a meal.   12/08/2014 at Unknown time  . OLANZapine (ZYPREXA) 10 MG tablet Take 10 mg by mouth at bedtime.   12/07/2014 at Unknown time  . omeprazole (PRILOSEC) 20 MG capsule Take 20 mg by mouth daily.   12/08/2014 at Unknown time  . oxybutynin (DITROPAN) 5 MG tablet Take 5 mg by mouth 2 (two) times daily.   12/08/2014 at Unknown time  . senna (SENOKOT) 8.6 MG TABS tablet Take 1 tablet by mouth at bedtime.   12/07/2014 at Unknown  time  . simvastatin (ZOCOR) 40 MG tablet Take 40 mg by mouth every evening.   12/07/2014 at Unknown time  . tamsulosin (FLOMAX) 0.4 MG CAPS Take 0.4 mg by mouth daily.   12/08/2014 at Unknown time  . Vitamin D, Ergocalciferol, (DRISDOL) 50000 UNITS CAPS capsule Take 50,000 Units by mouth every 7 (seven) days.   Past Week at Unknown time   Scheduled: . carbamazepine   200 mg Oral TID  . ceFEPime (MAXIPIME) IV  1 g Intravenous Q8H  . clonazePAM  0.5 mg Oral TID  . donepezil  10 mg Oral QHS  . folic acid  1 mg Oral Daily  . heparin  5,000 Units Subcutaneous 3 times per day  . insulin aspart  0-20 Units Subcutaneous TID WC  . insulin aspart  0-5 Units Subcutaneous QHS  . insulin detemir  30 Units Subcutaneous Daily  . latanoprost  1 drop Both Eyes QHS  . lisinopril  10 mg Oral Daily  . memantine  10 mg Oral BID  . metFORMIN  500 mg Oral BID WC  . OLANZapine  10 mg Oral QHS  . oxybutynin  5 mg Oral BID  . pantoprazole  40 mg Oral Daily  . senna  1 tablet Oral QHS  . simvastatin  40 mg Oral QPM  . tamsulosin  0.4 mg Oral Daily  . vancomycin  1,000 mg Intravenous Q12H  . [START ON 12/12/2014] Vitamin D (Ergocalciferol)  50,000 Units Oral Q7 days   Continuous:  [id number]  Assesment: He has healthcare associated pneumonia. He has what I think his cellulitis of his left leg. He refuses Doppler study but since it's improving I think it's much less likely that this is a clot. At baseline he has diabetes which is pretty well controlled and has schizophrenia and dementia. Active Problems:   HAP (hospital-acquired pneumonia)   Diabetes   Dementia   Schizophrenia   Hypertension   HCAP (healthcare-associated pneumonia)    Plan: Continue treatments    LOS: 2 days   Niquan Charnley L 12/10/2014, 10:26 AM

## 2014-12-11 DIAGNOSIS — L03116 Cellulitis of left lower limb: Secondary | ICD-10-CM | POA: Diagnosis present

## 2014-12-11 LAB — GLUCOSE, CAPILLARY
GLUCOSE-CAPILLARY: 169 mg/dL — AB (ref 70–99)
Glucose-Capillary: 124 mg/dL — ABNORMAL HIGH (ref 70–99)
Glucose-Capillary: 104 mg/dL — ABNORMAL HIGH (ref 70–99)
Glucose-Capillary: 149 mg/dL — ABNORMAL HIGH (ref 70–99)

## 2014-12-11 NOTE — Progress Notes (Signed)
Subjective: He has no new complaints. He is more alert and back to his baseline mental status  Objective: Vital signs in last 24 hours: Temp:  [98.7 F (37.1 C)-99 F (37.2 C)] 99 F (37.2 C) (04/24 0537) Pulse Rate:  [92-98] 92 (04/24 0537) Resp:  [18-20] 20 (04/24 0537) BP: (112-151)/(56-86) 151/86 mmHg (04/24 0537) SpO2:  [96 %-97 %] 96 % (04/24 0537) Weight change:  Last BM Date: 12/08/14 (per patient)  Intake/Output from previous day: 04/23 0701 - 04/24 0700 In: 250 [IV Piggyback:250] Out: -   PHYSICAL EXAM General appearance: alert and Confused holding unintelligible conversations with himself Resp: rhonchi bilaterally Cardio: regular rate and rhythm, S1, S2 normal, no murmur, click, rub or gallop GI: soft, non-tender; bowel sounds normal; no masses,  no organomegaly Extremities: His left leg swelling and erythema is better  Lab Results:  Results for orders placed or performed during the hospital encounter of 12/08/14 (from the past 48 hour(s))  Glucose, capillary     Status: Abnormal   Collection Time: 12/09/14 12:21 PM  Result Value Ref Range   Glucose-Capillary 156 (H) 70 - 99 mg/dL   Comment 1 Notify RN   Glucose, capillary     Status: Abnormal   Collection Time: 12/09/14  4:46 PM  Result Value Ref Range   Glucose-Capillary 106 (H) 70 - 99 mg/dL   Comment 1 Notify RN   Glucose, capillary     Status: Abnormal   Collection Time: 12/09/14  9:11 PM  Result Value Ref Range   Glucose-Capillary 198 (H) 70 - 99 mg/dL   Comment 1 Notify RN    Comment 2 Document in Chart   Glucose, capillary     Status: Abnormal   Collection Time: 12/10/14  7:55 AM  Result Value Ref Range   Glucose-Capillary 158 (H) 70 - 99 mg/dL   Comment 1 Notify RN   Glucose, capillary     Status: Abnormal   Collection Time: 12/10/14 11:14 AM  Result Value Ref Range   Glucose-Capillary 109 (H) 70 - 99 mg/dL   Comment 1 Notify RN   Glucose, capillary     Status: Abnormal   Collection Time:  12/10/14  4:36 PM  Result Value Ref Range   Glucose-Capillary 153 (H) 70 - 99 mg/dL   Comment 1 Notify RN   Glucose, capillary     Status: Abnormal   Collection Time: 12/10/14  9:39 PM  Result Value Ref Range   Glucose-Capillary 117 (H) 70 - 99 mg/dL  Glucose, capillary     Status: Abnormal   Collection Time: 12/11/14  7:35 AM  Result Value Ref Range   Glucose-Capillary 124 (H) 70 - 99 mg/dL   Comment 1 Notify RN    Comment 2 Document in Chart     ABGS No results for input(s): PHART, PO2ART, TCO2, HCO3 in the last 72 hours.  Invalid input(s): PCO2 CULTURES Recent Results (from the past 240 hour(s))  Urine culture     Status: None   Collection Time: 12/08/14 12:10 PM  Result Value Ref Range Status   Specimen Description URINE, CATHETERIZED  Final   Special Requests NONE  Final   Colony Count NO GROWTH Performed at [facility]   Final   Culture NO GROWTH [facility]formed at [facility]   Final   Report Status 12/09/2014 FINAL  Final  Blood culture (routine x 2)     Status: None (Preliminary result)   Collection Time: 12/08/14  2:22 PM  Result Value Ref Range Status   Specimen Description BLOOD LEFT ANTECUBITAL  Final   Special Requests BOTTLES DRAWN AEROBIC AND ANAEROBIC 8CC  Final   Culture NO GROWTH 2 DAYS  Final   Report Status PENDING  Incomplete  Blood culture (routine x 2)     Status: None (Preliminary result)   Collection Time: 12/08/14  2:30 PM  Result Value Ref Range Status   Specimen Description BLOOD RIGHT ANTECUBITAL  Final   Special Requests   Final    BOTTLES DRAWN AEROBIC AND ANAEROBIC AEB=8CC ANA=6CC   Culture NO GROWTH 2 DAYS  Final   Report Status PENDING  Incomplete  Culture, blood (routine x 2) Call MD if unable to obtain prior to antibiotics being given     Status: None (Preliminary result)   Collection Time: 12/08/14  4:34 PM  Result Value Ref Range Status   Specimen Description BLOOD LEFT ANTECUBITAL  Final   Special Requests    Final    BOTTLES DRAWN AEROBIC AND ANAEROBIC AEB=7CC ANA=6CC   Culture NO GROWTH 2 DAYS  Final   Report Status PENDING  Incomplete  Culture, blood (routine x 2) Call MD if unable to obtain prior to antibiotics being given     Status: None (Preliminary result)   Collection Time: 12/08/14  4:40 PM  Result Value Ref Range Status   Specimen Description BLOOD RIGHT FOREARM  Final   Special Requests BOTTLES DRAWN AEROBIC AND ANAEROBIC 6CC  Final   Culture NO GROWTH 2 DAYS  Final   Report Status PENDING  Incomplete   Studies/Results: No results found.  Medications:  Prior to Admission:  Prescriptions prior to admission  Medication Sig Dispense Refill Last Dose  . bimatoprost (LUMIGAN) 0.01 % SOLN Place 1 drop into both eyes at bedtime.   12/07/2014 at Unknown time  . carbamazepine (TEGRETOL) 200 MG tablet Take 200 mg by mouth 3 (three) times daily.   12/08/2014 at Unknown time  . clonazePAM (KLONOPIN) 0.5 MG tablet Take 0.5 mg by mouth 3 (three) times daily.   12/08/2014 at Unknown time  . donepezil (ARICEPT) 10 MG tablet Take 10 mg by mouth at bedtime.   12/07/2014 at Unknown time  . folic acid (FOLVITE) 1 MG tablet Take 1 mg by mouth daily.   12/08/2014 at Unknown time  . insulin aspart (NOVOLOG) 100 UNIT/ML injection Inject 5 Units into the skin 3 (three) times daily before meals. If blood sugar is 90-150. Sliding scale: 150-200=1 unit; 201-250=2 units; 251-300=3 units; 301-350=4 units; 351-400=5 units; Greater than 400=6 units & CALL DR.   12/08/2014 at Unknown time  . insulin detemir (LEVEMIR) 100 UNIT/ML injection Inject 30 Units into the skin daily.    12/08/2014 at Unknown time  . lisinopril (PRINIVIL,ZESTRIL) 10 MG tablet Take 10 mg by mouth daily.   12/08/2014 at Unknown time  . memantine (NAMENDA) 10 MG tablet Take 10 mg by mouth 2 (two) times daily.   12/08/2014 at Unknown time  . metFORMIN (GLUCOPHAGE) 500 MG tablet Take 500 mg by mouth 2 (two) times daily with a meal.   12/08/2014 at Unknown  time  . OLANZapine (ZYPREXA) 10 MG tablet Take 10 mg by mouth at bedtime.   12/07/2014 at Unknown time  . omeprazole (PRILOSEC) 20 MG capsule Take 20 mg by mouth daily.   12/08/2014 at Unknown time  . oxybutynin (DITROPAN) 5 MG tablet Take 5 mg by mouth 2 (two) times daily.   12/08/2014 at Unknown time  .  senna (SENOKOT) 8.6 MG TABS tablet Take 1 tablet by mouth at bedtime.   12/07/2014 at Unknown time  . simvastatin (ZOCOR) 40 MG tablet Take 40 mg by mouth every evening.   12/07/2014 at Unknown time  . tamsulosin (FLOMAX) 0.4 MG CAPS Take 0.4 mg by mouth daily.   12/08/2014 at Unknown time  . Vitamin D, Ergocalciferol, (DRISDOL) 50000 UNITS CAPS capsule Take 50,000 Units by mouth every 7 (seven) days.   Past Week at Unknown time   Scheduled: . carbamazepine  200 mg Oral TID  . ceFEPime (MAXIPIME) IV  1 g Intravenous Q8H  . clonazePAM  0.5 mg Oral TID  . donepezil  10 mg Oral QHS  . folic acid  1 mg Oral Daily  . heparin  5,000 Units Subcutaneous 3 times per day  . insulin aspart  0-20 Units Subcutaneous TID WC  . insulin aspart  0-5 Units Subcutaneous QHS  . insulin detemir  30 Units Subcutaneous Daily  . latanoprost  1 drop Both Eyes QHS  . lisinopril  10 mg Oral Daily  . memantine  10 mg Oral BID  . metFORMIN  500 mg Oral BID WC  . OLANZapine  10 mg Oral QHS  . oxybutynin  5 mg Oral BID  . pantoprazole  40 mg Oral Daily  . senna  1 tablet Oral QHS  . simvastatin  40 mg Oral QPM  . tamsulosin  0.4 mg Oral Daily  . vancomycin  1,000 mg Intravenous Q12H  . [START ON 12/12/2014] Vitamin D (Ergocalciferol)  50,000 Units Oral Q7 days   Continuous:  [id number]  Assesment: He has healthcare associated pneumonia. This is improving.  He has cellulitis of his left leg which is also improving   He has mental status changes from dementia and schizophrenia which is unchanged Active Problems:   HAP (hospital-acquired pneumonia)   Diabetes   Dementia   Schizophrenia   Hypertension    HCAP (healthcare-associated pneumonia)    Plan: He will continue current IV antibiotics. He may be able to go home as soon as tomorrow.    LOS: 3 days   Tamasha Laplante L 12/11/2014, 10:10 AM

## 2014-12-11 NOTE — Progress Notes (Signed)
Pt's IV became occluded this morning. Our AC attempted to start another IV, but pt became very agitated and started spitting. As of right now, will have to hold IV antibiotics until we get another IV access. Will try to start a new IV later

## 2014-12-12 LAB — BASIC METABOLIC PANEL
Anion gap: 7 (ref 5–15)
BUN: 21 mg/dL (ref 6–23)
CO2: 28 mmol/L (ref 19–32)
CREATININE: 0.82 mg/dL (ref 0.50–1.35)
Calcium: 8.8 mg/dL (ref 8.4–10.5)
Chloride: 105 mmol/L (ref 96–112)
GFR calc Af Amer: >90 mL/min (ref >90)
GFR, EST NON AFRICAN AMERICAN: 86 mL/min — AB (ref >90)
Glucose, Bld: 130 mg/dL — ABNORMAL HIGH (ref 70–99)
Potassium: 4.2 mmol/L (ref 3.5–5.1)
Sodium: 140 mmol/L (ref 135–145)

## 2014-12-12 LAB — GLUCOSE, CAPILLARY
Glucose-Capillary: 129 mg/dL — ABNORMAL HIGH (ref 70–99)
Glucose-Capillary: 222 mg/dL — ABNORMAL HIGH (ref 70–99)
Glucose-Capillary: 111 mg/dL — ABNORMAL HIGH (ref 70–99)

## 2014-12-12 LAB — LEGIONELLA ANTIGEN, URINE

## 2014-12-12 MED ORDER — SULFAMETHOXAZOLE-TRIMETHOPRIM 400-80 MG PO TABS: 1.0000 | ORAL_TABLET | Freq: Two times a day (BID) | ORAL | Status: DC

## 2014-12-12 MED ORDER — VANCOMYCIN HCL 1000 MG IV SOLR
INTRAVENOUS | Status: AC
  Filled 2014-12-12: qty 1000

## 2014-12-12 NOTE — Progress Notes (Signed)
Discharge to group home. Group home employee present at bedside. Discharge packet given to employee. Taken to lobby via wheelchair.

## 2014-12-12 NOTE — Progress Notes (Signed)
Subjective: He feels and looks better. He has no new complaints. His IV is out.  Objective: Vital signs in last 24 hours: Temp:  [98 F (36.7 C)-99.8 F (37.7 C)] 98 F (36.7 C) (04/25 0620) Pulse Rate:  [90-92] 92 (04/25 0620) Resp:  [18-20] 18 (04/25 0620) BP: (130-147)/(72-89) 141/89 mmHg (04/25 0620) SpO2:  [94 %-97 %] 94 % (04/25 0620) Weight:  [91.264 kg (201 lb 3.2 oz)] 91.264 kg (201 lb 3.2 oz) (04/25 8003) Weight change:  Last BM Date: 12/09/14 (per pt)  Intake/Output from previous day: 04/24 0701 - 04/25 0700 In: 720 [P.O.:720] Out: 2750 [Urine:2750]  PHYSICAL EXAM General appearance: alert, mild distress and Not combative now put see the nurse's note from yesterday. He still has nonsensical conversations with himself Resp: clear to auscultation bilaterally Cardio: regular rate and rhythm, S1, S2 normal, no murmur, click, rub or gallop GI: soft, non-tender; bowel sounds normal; no masses,  no organomegaly Extremities: His leg continues to improve  Lab Results:  Results for orders placed or performed during the hospital encounter of 12/08/14 (from the past 48 hour(s))  Glucose, capillary     Status: Abnormal   Collection Time: 12/10/14 11:14 AM  Result Value Ref Range   Glucose-Capillary 109 (H) 70 - 99 mg/dL   Comment 1 Notify RN   Glucose, capillary     Status: Abnormal   Collection Time: 12/10/14  4:36 PM  Result Value Ref Range   Glucose-Capillary 153 (H) 70 - 99 mg/dL   Comment 1 Notify RN   Glucose, capillary     Status: Abnormal   Collection Time: 12/10/14  9:39 PM  Result Value Ref Range   Glucose-Capillary 117 (H) 70 - 99 mg/dL  Glucose, capillary     Status: Abnormal   Collection Time: 12/11/14  7:35 AM  Result Value Ref Range   Glucose-Capillary 124 (H) 70 - 99 mg/dL   Comment 1 Notify RN    Comment 2 Document in Chart   Glucose, capillary     Status: Abnormal   Collection Time: 12/11/14 11:37 AM  Result Value Ref Range   Glucose-Capillary  169 (H) 70 - 99 mg/dL   Comment 1 Notify RN    Comment 2 Document in Chart   Glucose, capillary     Status: Abnormal   Collection Time: 12/11/14  4:06 PM  Result Value Ref Range   Glucose-Capillary 104 (H) 70 - 99 mg/dL   Comment 1 Notify RN    Comment 2 Document in Chart   Glucose, capillary     Status: Abnormal   Collection Time: 12/11/14  9:08 PM  Result Value Ref Range   Glucose-Capillary 149 (H) 70 - 99 mg/dL  Basic metabolic panel     Status: Abnormal   Collection Time: 12/12/14  6:25 AM  Result Value Ref Range   Sodium 140 135 - 145 mmol/L   Potassium 4.2 3.5 - 5.1 mmol/L   Chloride 105 96 - 112 mmol/L   CO2 28 19 - 32 mmol/L   Glucose, Bld 130 (H) 70 - 99 mg/dL   BUN 21 6 - 23 mg/dL   Creatinine, Ser 0.82 0.50 - 1.35 mg/dL   Calcium 8.8 8.4 - 10.5 mg/dL   GFR calc non Af Amer 86 (L) >90 mL/min   GFR calc Af Amer >90 >90 mL/min    Comment: (NOTE) The eGFR has been calculated using the CKD EPI equation. This calculation has not been validated in all clinical situations.  eGFR's persistently <90 mL/min signify possible Chronic Kidney Disease.    Anion gap 7 5 - 15  Glucose, capillary     Status: Abnormal   Collection Time: 12/12/14  7:40 AM  Result Value Ref Range   Glucose-Capillary 129 (H) 70 - 99 mg/dL   Comment 1 Notify RN    Comment 2 Document in Chart     ABGS No results for input(s): PHART, PO2ART, TCO2, HCO3 in the last 72 hours.  Invalid input(s): PCO2 CULTURES Recent Results (from the past 240 hour(s))  Urine culture     Status: None   Collection Time: 12/08/14 12:10 PM  Result Value Ref Range Status   Specimen Description URINE, CATHETERIZED  Final   Special Requests NONE  Final   Colony Count NO GROWTH Performed at [facility]   Final   Culture NO GROWTH Performed at [facility]   Final   Report Status 12/09/2014 FINAL  Final  Blood culture (routine x 2)     Status: None (Preliminary result)   Collection Time: 12/08/14   2:22 PM  Result Value Ref Range Status   Specimen Description BLOOD LEFT ANTECUBITAL  Final   Special Requests BOTTLES DRAWN AEROBIC AND ANAEROBIC 8CC  Final   Culture NO GROWTH 3 DAYS  Final   Report Status PENDING  Incomplete  Blood culture (routine x 2)     Status: None (Preliminary result)   Collection Time: 12/08/14  2:30 PM  Result Value Ref Range Status   Specimen Description BLOOD RIGHT ANTECUBITAL  Final   Special Requests   Final    BOTTLES DRAWN AEROBIC AND ANAEROBIC AEB=8CC ANA=6CC   Culture NO GROWTH 3 DAYS  Final   Report Status PENDING  Incomplete  Culture, blood (routine x 2) Call MD if unable to obtain prior to antibiotics being given     Status: None (Preliminary result)   Collection Time: 12/08/14  4:34 PM  Result Value Ref Range Status   Specimen Description BLOOD LEFT ANTECUBITAL  Final   Special Requests   Final    BOTTLES DRAWN AEROBIC AND ANAEROBIC AEB=7CC ANA=6CC   Culture NO GROWTH 3 DAYS  Final   Report Status PENDING  Incomplete  Culture, blood (routine x 2) Call MD if unable to obtain prior to antibiotics being given     Status: None (Preliminary result)   Collection Time: 12/08/14  4:40 PM  Result Value Ref Range Status   Specimen Description BLOOD RIGHT FOREARM  Final   Special Requests BOTTLES DRAWN AEROBIC AND ANAEROBIC 6CC  Final   Culture NO GROWTH 3 DAYS  Final   Report Status PENDING  Incomplete   Studies/Results: No results found.  Medications:  Prior to Admission:  Prescriptions prior to admission  Medication Sig Dispense Refill Last Dose  . bimatoprost (LUMIGAN) 0.01 % SOLN Place 1 drop into both eyes at bedtime.   12/07/2014 at Unknown time  . carbamazepine (TEGRETOL) 200 MG tablet Take 200 mg by mouth 3 (three) times daily.   12/08/2014 at Unknown time  . clonazePAM (KLONOPIN) 0.5 MG tablet Take 0.5 mg by mouth 3 (three) times daily.   12/08/2014 at Unknown time  . donepezil (ARICEPT) 10 MG tablet Take 10 mg by mouth at bedtime.    12/07/2014 at Unknown time  . folic acid (FOLVITE) 1 MG tablet Take 1 mg by mouth daily.   12/08/2014 at Unknown time  . insulin aspart (NOVOLOG) 100 UNIT/ML injection Inject 5 Units into  the skin 3 (three) times daily before meals. If blood sugar is 90-150. Sliding scale: 150-200=1 unit; 201-250=2 units; 251-300=3 units; 301-350=4 units; 351-400=5 units; Greater than 400=6 units & CALL DR.   12/08/2014 at Unknown time  . insulin detemir (LEVEMIR) 100 UNIT/ML injection Inject 30 Units into the skin daily.    12/08/2014 at Unknown time  . lisinopril (PRINIVIL,ZESTRIL) 10 MG tablet Take 10 mg by mouth daily.   12/08/2014 at Unknown time  . memantine (NAMENDA) 10 MG tablet Take 10 mg by mouth 2 (two) times daily.   12/08/2014 at Unknown time  . metFORMIN (GLUCOPHAGE) 500 MG tablet Take 500 mg by mouth 2 (two) times daily with a meal.   12/08/2014 at Unknown time  . OLANZapine (ZYPREXA) 10 MG tablet Take 10 mg by mouth at bedtime.   12/07/2014 at Unknown time  . omeprazole (PRILOSEC) 20 MG capsule Take 20 mg by mouth daily.   12/08/2014 at Unknown time  . oxybutynin (DITROPAN) 5 MG tablet Take 5 mg by mouth 2 (two) times daily.   12/08/2014 at Unknown time  . senna (SENOKOT) 8.6 MG TABS tablet Take 1 tablet by mouth at bedtime.   12/07/2014 at Unknown time  . simvastatin (ZOCOR) 40 MG tablet Take 40 mg by mouth every evening.   12/07/2014 at Unknown time  . tamsulosin (FLOMAX) 0.4 MG CAPS Take 0.4 mg by mouth daily.   12/08/2014 at Unknown time  . Vitamin D, Ergocalciferol, (DRISDOL) 50000 UNITS CAPS capsule Take 50,000 Units by mouth every 7 (seven) days.   Past Week at Unknown time   Scheduled: . carbamazepine  200 mg Oral TID  . ceFEPime (MAXIPIME) IV  1 g Intravenous Q8H  . clonazePAM  0.5 mg Oral TID  . donepezil  10 mg Oral QHS  . folic acid  1 mg Oral Daily  . heparin  5,000 Units Subcutaneous 3 times per day  . insulin aspart  0-20 Units Subcutaneous TID WC  . insulin aspart  0-5 Units Subcutaneous QHS   . insulin detemir  30 Units Subcutaneous Daily  . latanoprost  1 drop Both Eyes QHS  . lisinopril  10 mg Oral Daily  . memantine  10 mg Oral BID  . metFORMIN  500 mg Oral BID WC  . OLANZapine  10 mg Oral QHS  . oxybutynin  5 mg Oral BID  . pantoprazole  40 mg Oral Daily  . senna  1 tablet Oral QHS  . simvastatin  40 mg Oral QPM  . tamsulosin  0.4 mg Oral Daily  . vancomycin  1,000 mg Intravenous Q12H  . Vitamin D (Ergocalciferol)  50,000 Units Oral Q7 days   Continuous:  [id number]  Assesment: He was admitted with healthcare associated pneumonia and cellulitis of his left leg. Both of these are improving with current treatment. His situation is complicated by dementia and schizophrenia. At this time I think he is at maximum hospital benefit and should be able to go back to his assisted living facility on oral antibiotics Active Problems:   Diabetes   Dementia   Schizophrenia   Hypertension   HCAP (healthcare-associated pneumonia)   Cellulitis of leg, left    Plan: Discharge today    LOS: 4 days   Andre Rowe L 12/12/2014, 8:50 AM

## 2014-12-12 NOTE — Discharge Summary (Signed)
Physician Discharge Summary  Patient ID: Andre Rowe MRN: [id number] DOB/AGE: 72-Sep-1942 72 y.o. Primary Care Physician:Sherrine Salberg L, MD Admit date: 12/08/2014 Discharge date: 12/12/2014    Discharge Diagnoses:   Active Problems:   Diabetes   Dementia   Schizophrenia   Hypertension   HCAP (healthcare-associated pneumonia)   Cellulitis of leg, left     Medication List    TAKE these medications        bimatoprost 0.01 % Soln  Commonly known as:  LUMIGAN  Place 1 drop into both eyes at bedtime.     carbamazepine 200 MG tablet  Commonly known as:  TEGRETOL  Take 200 mg by mouth 3 (three) times daily.     clonazePAM 0.5 MG tablet  Commonly known as:  KLONOPIN  Take 0.5 mg by mouth 3 (three) times daily.     donepezil 10 MG tablet  Commonly known as:  ARICEPT  Take 10 mg by mouth at bedtime.     folic acid 1 MG tablet  Commonly known as:  FOLVITE  Take 1 mg by mouth daily.     insulin aspart 100 UNIT/ML injection  Commonly known as:  novoLOG  Inject 5 Units into the skin 3 (three) times daily before meals. If blood sugar is 90-150. Sliding scale: 150-200=1 unit; 201-250=2 units; 251-300=3 units; 301-350=4 units; 351-400=5 units; Greater than 400=6 units & CALL DR.     insulin detemir 100 UNIT/ML injection  Commonly known as:  LEVEMIR  Inject 30 Units into the skin daily.     lisinopril 10 MG tablet  Commonly known as:  PRINIVIL,ZESTRIL  Take 10 mg by mouth daily.     memantine 10 MG tablet  Commonly known as:  NAMENDA  Take 10 mg by mouth 2 (two) times daily.     metFORMIN 500 MG tablet  Commonly known as:  GLUCOPHAGE  Take 500 mg by mouth 2 (two) times daily with a meal.     OLANZapine 10 MG tablet  Commonly known as:  ZYPREXA  Take 10 mg by mouth at bedtime.     omeprazole 20 MG capsule  Commonly known as:  PRILOSEC  Take 20 mg by mouth daily.     oxybutynin 5 MG tablet  Commonly known as:  DITROPAN  Take 5 mg by mouth 2 (two) times  daily.     senna 8.6 MG Tabs tablet  Commonly known as:  SENOKOT  Take 1 tablet by mouth at bedtime.     simvastatin 40 MG tablet  Commonly known as:  ZOCOR  Take 40 mg by mouth every evening.     sulfamethoxazole-trimethoprim 400-80 MG per tablet  Commonly known as:  BACTRIM  Take 1 tablet by mouth 2 (two) times daily.     tamsulosin 0.4 MG Caps capsule  Commonly known as:  FLOMAX  Take 0.4 mg by mouth daily.     Vitamin D (Ergocalciferol) 50000 UNITS Caps capsule  Commonly known as:  DRISDOL  Take 50,000 Units by mouth every 7 (seven) days.        Discharged Condition: Improved    Consults: None  Significant Diagnostic Studies: Dg Chest 2 View  12/08/2014   CLINICAL DATA:  Back pain, fever.  EXAM: CHEST  2 VIEW  COMPARISON:  September 05, 2014.  FINDINGS: Stable cardiomediastinal silhouette. No pneumothorax or significant pleural effusion is noted. Stable eventration of the left hemidiaphragm is noted. Mild right basilar subsegmental atelectasis or pneumonia is noted. Bony thorax is intact.  IMPRESSION: Mild right basilar subsegmental atelectasis or pneumonia. Short-term follow-up radiographs are recommended until resolution.   Electronically Signed   By: Lupita Raider, M.D.   On: 12/08/2014 14:35   Ct Head Wo Contrast  12/08/2014   CLINICAL DATA:  Low back pain for 1 day.  Altered mental status.  EXAM: CT HEAD WITHOUT CONTRAST  TECHNIQUE: Contiguous axial images were obtained from the base of the skull through the vertex without intravenous contrast.  COMPARISON:  CT head 10/16/2008.  FINDINGS: The patient was unable to remain motionless for the exam. Small or subtle lesions could be overlooked.  No evidence for acute infarction, hemorrhage, mass lesion, hydrocephalus, or extra-axial fluid. Mild cerebral and cerebellar atrophy. Hypoattenuation of white matter suggesting small vessel disease. Calvarium intact. Old medial blowout fracture RIGHT orbit. Vascular calcification. No  acute sinus or mastoid disease.  IMPRESSION: Motion degraded exam. No definite acute intracranial findings. Similar appearance to prior CT.  Evidence for old RIGHT orbital trauma.   Electronically Signed   By: Davonna Belling M.D.   On: 12/08/2014 15:10   Ct Renal Stone Study  12/08/2014   CLINICAL DATA:  Low back pain  for 1 day with fever.  EXAM: CT ABDOMEN AND PELVIS WITHOUT CONTRAST  TECHNIQUE: Multidetector CT imaging of the abdomen and pelvis was performed following the standard protocol without IV contrast.  COMPARISON:  CT AP with contrast 12/25/2013.  FINDINGS: COPD changes lung bases. Mild RIGHT basilar scarring. These are stable from priors. Marked elevation LEFT hemidiaphragm. No intrarenal or proximal ureteral calculi on either side. No evidence of hydronephrosis or other secondary signs of upper urinary tract obstruction. Within limits of unenhanced technique, normal appearing renal cortex. BILATERAL renal cystic disease appears grossly stable compared with prior CT abdomen pelvis with contrast performed 12/25/2013.  Again, within limits of unenhanced technique, remaining visualized upper abdomen unremarkable. Visualized extreme lung bases clear. Stable RIGHT adrenal nodule, likely adenoma. Stable gallstones without signs of cholecystitis. No appendiceal inflammation.  No distal ureteral calculi on either side. Uncomplicated appearing umbilical hernia. Non aneurysmal atherosclerotic calcification of the aorta. Moderately thick-walled bladder uncertain significance. No worrisome osseous lesions.  IMPRESSION: Within limits of evaluation on noncontrast CT, stable BILATERAL renal cystic disease. No obstructive uropathy. Gallstones.   Electronically Signed   By: Davonna Belling M.D.   On: 12/08/2014 15:07    Lab Results: Basic Metabolic Panel:  Recent Labs  16/10/96 0625  NA 140  K 4.2  CL 105  CO2 28  GLUCOSE 130*  BUN 21  CREATININE 0.82  CALCIUM 8.8   Liver Function Tests: No results for  input(s): AST, ALT, ALKPHOS, BILITOT, PROT, ALBUMIN in the last 72 hours.   CBC: No results for input(s): WBC, NEUTROABS, HGB, HCT, MCV, PLT in the last 72 hours.  Recent Results (from the past 240 hour(s))  Urine culture     Status: None   Collection Time: 12/08/14 12:10 PM  Result Value Ref Range Status   Specimen Description URINE, CATHETERIZED  Final   Special Requests NONE  Final   Colony Count NO GROWTH Performed at [facility]   Final   Culture NO GROWTH Performed at [facility]   Final   Report Status 12/09/2014 FINAL  Final  Blood culture (routine x 2)     Status: None (Preliminary result)   Collection Time: 12/08/14  2:22 PM  Result Value Ref Range Status   Specimen Description BLOOD LEFT ANTECUBITAL  Final  Special Requests BOTTLES DRAWN AEROBIC AND ANAEROBIC 8CC  Final   Culture NO GROWTH 3 DAYS  Final   Report Status PENDING  Incomplete  Blood culture (routine x 2)     Status: None (Preliminary result)   Collection Time: 12/08/14  2:30 PM  Result Value Ref Range Status   Specimen Description BLOOD RIGHT ANTECUBITAL  Final   Special Requests   Final    BOTTLES DRAWN AEROBIC AND ANAEROBIC AEB=8CC ANA=6CC   Culture NO GROWTH 3 DAYS  Final   Report Status PENDING  Incomplete  Culture, blood (routine x 2) Call MD if unable to obtain prior to antibiotics being given     Status: None (Preliminary result)   Collection Time: 12/08/14  4:34 PM  Result Value Ref Range Status   Specimen Description BLOOD LEFT ANTECUBITAL  Final   Special Requests   Final    BOTTLES DRAWN AEROBIC AND ANAEROBIC AEB=7CC ANA=6CC   Culture NO GROWTH 3 DAYS  Final   Report Status PENDING  Incomplete  Culture, blood (routine x 2) Call MD if unable to obtain prior to antibiotics being given     Status: None (Preliminary result)   Collection Time: 12/08/14  4:40 PM  Result Value Ref Range Status   Specimen Description BLOOD RIGHT FOREARM  Final   Special Requests BOTTLES DRAWN  AEROBIC AND ANAEROBIC 6CC  Final   Culture NO GROWTH 3 DAYS  Final   Report Status PENDING  Incomplete     Hospital Course: This is a 74 year old who has multiple medical problems including dementia schizophrenia. He was in his usual state of poor health at a rest home when he developed increasing problems with confusion and was brought to the emergency department and found to have healthcare associated pneumonia. He also had cellulitis of his left leg. He was started on IV antibiotics and improved. By the time of discharge he was back at baseline mental status his chest was much clearer and his leg looked better  Discharge Exam: Blood pressure 141/89, pulse 92, temperature 98 F (36.7 C), temperature source Oral, resp. rate 18, height 5\' 10"  (1.778 m), weight 91.264 kg (201 lb 3.2 oz), SpO2 94 %. He is awake and alert. He is confused. His leg is still slightly erythematous but much improved. His chest is clear  Disposition: Home      Discharge Instructions    Discharge patient    Complete by:  As directed              Signed: Darriana Deboy L   12/12/2014, 9:16 AM

## 2014-12-12 NOTE — Progress Notes (Signed)
CSW facilitated discharge.    CSW left a message at 11:42 a.m [facility] advising that patient was being discharged and would need to be transported back to the facility.   CSW received a return call from Ms. Woodson. Ms. Lorette AngWoodson indicated that she should have been called "this morning" and notified that patient was being discharged because she was at work and did not get off until 6:30.  CSW advised Ms. Woodson that she could pick patient up upon getting off work.  Ms. Lorette AngWoodson indicated that she was under the impression that patient would be discharged tomorrow.  She inquired as to whether patient had his dopler.  CSW advised her to contact patient's nurse, Luster Landsbergenee.  CSW signing off.  Tretha SciaraHeather Aishi Courts, [place] [phone]

## 2014-12-13 LAB — CULTURE, BLOOD (ROUTINE X 2)
CULTURE: NO GROWTH
Culture: NO GROWTH
Culture: NO GROWTH
Culture: NO GROWTH

## 2014-12-14 NOTE — Care Management Utilization Note (Signed)
UR completed 

## 2014-12-29 DIAGNOSIS — R419 Unspecified symptoms and signs involving cognitive functions and awareness: Secondary | ICD-10-CM | POA: Diagnosis not present

## 2015-01-03 DIAGNOSIS — F489 Nonpsychotic mental disorder, unspecified: Secondary | ICD-10-CM | POA: Diagnosis not present

## 2015-01-03 DIAGNOSIS — L03116 Cellulitis of left lower limb: Secondary | ICD-10-CM | POA: Diagnosis not present

## 2015-01-03 DIAGNOSIS — J449 Chronic obstructive pulmonary disease, unspecified: Secondary | ICD-10-CM | POA: Diagnosis not present

## 2015-01-03 DIAGNOSIS — E1165 Type 2 diabetes mellitus with hyperglycemia: Secondary | ICD-10-CM | POA: Diagnosis not present

## 2015-01-11 DIAGNOSIS — E782 Mixed hyperlipidemia: Secondary | ICD-10-CM | POA: Diagnosis not present

## 2015-01-11 DIAGNOSIS — I10 Essential (primary) hypertension: Secondary | ICD-10-CM | POA: Diagnosis not present

## 2015-01-11 DIAGNOSIS — Z713 Dietary counseling and surveillance: Secondary | ICD-10-CM | POA: Diagnosis not present

## 2015-01-11 DIAGNOSIS — E1165 Type 2 diabetes mellitus with hyperglycemia: Secondary | ICD-10-CM | POA: Diagnosis not present

## 2015-01-17 DIAGNOSIS — E1165 Type 2 diabetes mellitus with hyperglycemia: Secondary | ICD-10-CM | POA: Diagnosis not present

## 2015-01-17 DIAGNOSIS — E559 Vitamin D deficiency, unspecified: Secondary | ICD-10-CM | POA: Diagnosis not present

## 2015-01-17 DIAGNOSIS — I10 Essential (primary) hypertension: Secondary | ICD-10-CM | POA: Diagnosis not present

## 2015-01-17 DIAGNOSIS — E785 Hyperlipidemia, unspecified: Secondary | ICD-10-CM | POA: Diagnosis not present

## 2015-01-31 DIAGNOSIS — E1342 Other specified diabetes mellitus with diabetic polyneuropathy: Secondary | ICD-10-CM | POA: Diagnosis not present

## 2015-01-31 DIAGNOSIS — L851 Acquired keratosis [keratoderma] palmaris et plantaris: Secondary | ICD-10-CM | POA: Diagnosis not present

## 2015-01-31 DIAGNOSIS — B351 Tinea unguium: Secondary | ICD-10-CM | POA: Diagnosis not present

## 2015-04-05 DIAGNOSIS — F209 Schizophrenia, unspecified: Secondary | ICD-10-CM | POA: Diagnosis not present

## 2015-04-05 DIAGNOSIS — E1165 Type 2 diabetes mellitus with hyperglycemia: Secondary | ICD-10-CM | POA: Diagnosis not present

## 2015-04-05 DIAGNOSIS — J449 Chronic obstructive pulmonary disease, unspecified: Secondary | ICD-10-CM | POA: Diagnosis not present

## 2015-04-05 DIAGNOSIS — N138 Other obstructive and reflux uropathy: Secondary | ICD-10-CM | POA: Diagnosis not present

## 2015-04-13 DIAGNOSIS — E782 Mixed hyperlipidemia: Secondary | ICD-10-CM | POA: Diagnosis not present

## 2015-04-13 DIAGNOSIS — E1165 Type 2 diabetes mellitus with hyperglycemia: Secondary | ICD-10-CM | POA: Diagnosis not present

## 2015-04-13 DIAGNOSIS — I10 Essential (primary) hypertension: Secondary | ICD-10-CM | POA: Diagnosis not present

## 2015-04-14 DIAGNOSIS — L851 Acquired keratosis [keratoderma] palmaris et plantaris: Secondary | ICD-10-CM | POA: Diagnosis not present

## 2015-04-14 DIAGNOSIS — E1342 Other specified diabetes mellitus with diabetic polyneuropathy: Secondary | ICD-10-CM | POA: Diagnosis not present

## 2015-04-14 DIAGNOSIS — B351 Tinea unguium: Secondary | ICD-10-CM | POA: Diagnosis not present

## 2015-04-19 DIAGNOSIS — E559 Vitamin D deficiency, unspecified: Secondary | ICD-10-CM | POA: Diagnosis not present

## 2015-04-19 DIAGNOSIS — E785 Hyperlipidemia, unspecified: Secondary | ICD-10-CM | POA: Diagnosis not present

## 2015-04-19 DIAGNOSIS — I10 Essential (primary) hypertension: Secondary | ICD-10-CM | POA: Diagnosis not present

## 2015-04-19 DIAGNOSIS — E1165 Type 2 diabetes mellitus with hyperglycemia: Secondary | ICD-10-CM | POA: Diagnosis not present

## 2015-05-02 DIAGNOSIS — R419 Unspecified symptoms and signs involving cognitive functions and awareness: Secondary | ICD-10-CM | POA: Diagnosis not present

## 2015-05-22 ENCOUNTER — Encounter: Payer: Self-pay | Admitting: "Endocrinology

## 2015-05-22 ENCOUNTER — Other Ambulatory Visit: Payer: Self-pay

## 2015-05-22 MED ORDER — METFORMIN HCL 1000 MG PO TABS: 1000.0000 mg | ORAL_TABLET | Freq: Two times a day (BID) | ORAL | Status: DC

## 2015-05-22 MED ORDER — ERGOCALCIFEROL 1.25 MG (50000 UT) PO CAPS: 50000.0000 [IU] | ORAL_CAPSULE | ORAL | Status: DC

## 2015-05-24 ENCOUNTER — Ambulatory Visit: Payer: Self-pay | Admitting: "Endocrinology

## 2015-05-31 ENCOUNTER — Other Ambulatory Visit: Payer: Self-pay | Admitting: "Endocrinology

## 2015-06-27 DIAGNOSIS — L851 Acquired keratosis [keratoderma] palmaris et plantaris: Secondary | ICD-10-CM | POA: Diagnosis not present

## 2015-06-27 DIAGNOSIS — B351 Tinea unguium: Secondary | ICD-10-CM | POA: Diagnosis not present

## 2015-06-27 DIAGNOSIS — E1342 Other specified diabetes mellitus with diabetic polyneuropathy: Secondary | ICD-10-CM | POA: Diagnosis not present

## 2015-07-05 DIAGNOSIS — F0391 Unspecified dementia with behavioral disturbance: Secondary | ICD-10-CM | POA: Diagnosis not present

## 2015-07-05 DIAGNOSIS — E1165 Type 2 diabetes mellitus with hyperglycemia: Secondary | ICD-10-CM | POA: Diagnosis not present

## 2015-07-05 DIAGNOSIS — F209 Schizophrenia, unspecified: Secondary | ICD-10-CM | POA: Diagnosis not present

## 2015-07-05 DIAGNOSIS — J449 Chronic obstructive pulmonary disease, unspecified: Secondary | ICD-10-CM | POA: Diagnosis not present

## 2015-07-19 ENCOUNTER — Ambulatory Visit: Payer: Self-pay | Admitting: "Endocrinology

## 2015-07-21 ENCOUNTER — Ambulatory Visit: Payer: Self-pay | Admitting: "Endocrinology

## 2015-07-21 DIAGNOSIS — E1165 Type 2 diabetes mellitus with hyperglycemia: Secondary | ICD-10-CM | POA: Diagnosis not present

## 2015-07-21 DIAGNOSIS — E782 Mixed hyperlipidemia: Secondary | ICD-10-CM | POA: Diagnosis not present

## 2015-07-21 DIAGNOSIS — I10 Essential (primary) hypertension: Secondary | ICD-10-CM | POA: Diagnosis not present

## 2015-07-21 LAB — HEMOGLOBIN A1C: Hgb A1c MFr Bld: 6.8 % — AB (ref 4.0–6.0)

## 2015-08-02 ENCOUNTER — Encounter: Payer: Self-pay | Admitting: "Endocrinology

## 2015-08-02 ENCOUNTER — Ambulatory Visit (INDEPENDENT_AMBULATORY_CARE_PROVIDER_SITE_OTHER): Payer: Medicare Other | Admitting: "Endocrinology

## 2015-08-02 VITALS — BP 137/87 | HR 100 | Ht 68.0 in | Wt 210.0 lb

## 2015-08-02 DIAGNOSIS — E1165 Type 2 diabetes mellitus with hyperglycemia: Secondary | ICD-10-CM | POA: Diagnosis not present

## 2015-08-02 DIAGNOSIS — IMO0001 Reserved for inherently not codable concepts without codable children: Secondary | ICD-10-CM

## 2015-08-02 DIAGNOSIS — E782 Mixed hyperlipidemia: Secondary | ICD-10-CM | POA: Insufficient documentation

## 2015-08-02 DIAGNOSIS — I10 Essential (primary) hypertension: Secondary | ICD-10-CM

## 2015-08-02 DIAGNOSIS — E785 Hyperlipidemia, unspecified: Secondary | ICD-10-CM | POA: Diagnosis not present

## 2015-08-02 DIAGNOSIS — Z794 Long term (current) use of insulin: Secondary | ICD-10-CM | POA: Diagnosis not present

## 2015-08-02 NOTE — Progress Notes (Signed)
Subjective:    Patient ID: Andre Rowe, male    DOB: March 05, 1941, PCP Fredirick Maudlin, MD   Past Medical History  Diagnosis Date  . High cholesterol   . GERD (gastroesophageal reflux disease)   . Hypertension   . Alzheimer disease   . Diabetes mellitus without complication (HCC)   . Schizophrenia (HCC)   . Dementia   . Overactive bladder    Past Surgical History  Procedure Laterality Date  . Unable     Social History   Social History  . Marital Status: Single    Spouse Name: N/A  . Number of Children: N/A  . Years of Education: N/A   Social History Main Topics  . Smoking status: Current Every Day Smoker  . Smokeless tobacco: None  . Alcohol Use: No  . Drug Use: No  . Sexual Activity: Not Asked   Other Topics Concern  . None   Social History Narrative   Outpatient Encounter Prescriptions as of 08/02/2015  Medication Sig  . insulin detemir (LEVEMIR) 100 UNIT/ML injection Inject 35 Units into the skin every morning.  . metFORMIN (GLUCOPHAGE) 500 MG tablet Take 100 mg by mouth 2 (two) times daily after a meal.  . [DISCONTINUED] insulin aspart (NOVOLOG) 100 UNIT/ML injection Inject 5 Units into the skin 3 (three) times daily before meals. If blood sugar is 90-150. Sliding scale: 150-200=1 unit; 201-250=2 units; 251-300=3 units; 301-350=4 units; 351-400=5 units; Greater than 400=6 units & CALL DR.  . bimatoprost (LUMIGAN) 0.01 % SOLN Place 1 drop into both eyes at bedtime.  . carbamazepine (TEGRETOL) 200 MG tablet Take 200 mg by mouth 3 (three) times daily.  . clonazePAM (KLONOPIN) 0.5 MG tablet Take 0.5 mg by mouth 3 (three) times daily.  Marland Kitchen donepezil (ARICEPT) 10 MG tablet Take 10 mg by mouth at bedtime.  . folic acid (FOLVITE) 1 MG tablet Take 1 mg by mouth daily.  Marland Kitchen lisinopril (PRINIVIL,ZESTRIL) 10 MG tablet Take 10 mg by mouth daily.  . memantine (NAMENDA) 10 MG tablet Take 10 mg by mouth 2 (two) times daily.  Marland Kitchen OLANZapine (ZYPREXA) 10 MG tablet Take 10 mg  by mouth at bedtime.  Marland Kitchen omeprazole (PRILOSEC) 20 MG capsule Take 20 mg by mouth daily.  Marland Kitchen oxybutynin (DITROPAN) 5 MG tablet Take 5 mg by mouth 2 (two) times daily.  Marland Kitchen senna (SENOKOT) 8.6 MG TABS tablet Take 1 tablet by mouth at bedtime.  . simvastatin (ZOCOR) 40 MG tablet Take 40 mg by mouth every evening.  . sulfamethoxazole-trimethoprim (BACTRIM) 400-80 MG per tablet Take 1 tablet by mouth 2 (two) times daily.  . tamsulosin (FLOMAX) 0.4 MG CAPS Take 0.4 mg by mouth daily.  . Vitamin D, Ergocalciferol, (DRISDOL) 50000 UNITS CAPS capsule Take 50,000 Units by mouth every 7 (seven) days.   No facility-administered encounter medications on file as of 08/02/2015.   ALLERGIES: No Known Allergies VACCINATION STATUS:  There is no immunization history on file for this patient.  Diabetes He presents for his follow-up diabetic visit. He has type 2 diabetes mellitus. Onset time: he was diagnosed at age 74 yrs. His disease course has been improving. There are no hypoglycemic associated symptoms. Pertinent negatives for hypoglycemia include no confusion, headaches, pallor or seizures. There are no diabetic associated symptoms. Pertinent negatives for diabetes include no chest pain, no fatigue, no polydipsia, no polyphagia, no polyuria and no weakness. There are no hypoglycemic complications. Symptoms are improving. There are no diabetic complications. Risk factors for coronary  artery disease include diabetes mellitus, dyslipidemia, hypertension, male sex, obesity, sedentary lifestyle and tobacco exposure. Current diabetic treatment includes insulin injections and oral agent (monotherapy). He is compliant with treatment most of the time. His weight is stable. He is following a generally unhealthy diet. He never participates in exercise. His breakfast blood glucose range is generally 140-180 mg/dl. His lunch blood glucose range is generally 140-180 mg/dl. His dinner blood glucose range is generally 140-180 mg/dl.  His overall blood glucose range is 140-180 mg/dl. An ACE inhibitor/angiotensin II receptor blocker is being taken.  Hyperlipidemia This is a chronic problem. The current episode started more than 1 year ago. The problem is controlled. Exacerbating diseases include diabetes and obesity. Pertinent negatives include no chest pain, myalgias or shortness of breath. Current antihyperlipidemic treatment includes statins. Risk factors for coronary artery disease include diabetes mellitus, dyslipidemia, hypertension, male sex, obesity and a sedentary lifestyle.  Hypertension This is a chronic problem. The current episode started more than 1 year ago. The problem is controlled. Pertinent negatives include no chest pain, headaches, neck pain, palpitations or shortness of breath. Risk factors for coronary artery disease include diabetes mellitus, dyslipidemia, smoking/tobacco exposure and obesity. Past treatments include ACE inhibitors.     Review of Systems  Constitutional: Negative for fatigue and unexpected weight change.  HENT: Negative for dental problem, mouth sores and trouble swallowing.   Eyes: Negative for visual disturbance.  Respiratory: Negative for cough, choking, chest tightness, shortness of breath and wheezing.   Cardiovascular: Negative for chest pain, palpitations and leg swelling.  Gastrointestinal: Negative for nausea, vomiting, abdominal pain, diarrhea, constipation and abdominal distention.  Endocrine: Negative for polydipsia, polyphagia and polyuria.  Genitourinary: Negative for dysuria, urgency, hematuria and flank pain.  Musculoskeletal: Negative for myalgias, back pain, gait problem and neck pain.  Skin: Negative for pallor, rash and wound.  Neurological: Negative for seizures, syncope, weakness, numbness and headaches.  Psychiatric/Behavioral: Negative.  Negative for confusion and dysphoric mood.    Objective:    BP 137/87 mmHg  Pulse 100  Ht 5\' 8"  (1.727 m)  Wt 210 lb  (95.255 kg)  BMI 31.94 kg/m2  SpO2 97%  Wt Readings from Last 3 Encounters:  08/02/15 210 lb (95.255 kg)  12/12/14 201 lb 3.2 oz (91.264 kg)  03/20/13 185 lb 5 oz (84.057 kg)    Physical Exam  Constitutional: He is oriented to person, place, and time. He appears well-developed and well-nourished. He is cooperative. No distress.  HENT:  Head: Normocephalic and atraumatic.  Eyes: EOM are normal.  Neck: Normal range of motion. Neck supple. No tracheal deviation present. No thyromegaly present.  Cardiovascular: Normal rate, S1 normal, S2 normal and normal heart sounds.  Exam reveals no gallop.   No murmur heard. Pulses:      Dorsalis pedis pulses are 1+ on the right side, and 1+ on the left side.       Posterior tibial pulses are 1+ on the right side, and 1+ on the left side.  Pulmonary/Chest: Breath sounds normal. No respiratory distress. He has no wheezes.  Abdominal: Soft. Bowel sounds are normal. He exhibits no distension. There is no tenderness. There is no guarding and no CVA tenderness.  Musculoskeletal: He exhibits no edema.       Right shoulder: He exhibits no swelling and no deformity.  Neurological: He is alert and oriented to person, place, and time. He has normal strength and normal reflexes. No cranial nerve deficit or sensory deficit. Gait normal.  Skin: Skin is warm and dry. No rash noted. No cyanosis. Nails show no clubbing.  Psychiatric: He has a normal mood and affect. His speech is normal and behavior is normal. Judgment and thought content normal. Cognition and memory are normal.     Complete Blood Count (Most recent): Lab Results  Component Value Date   WBC 12.1* 12/09/2014   HGB 11.5* 12/09/2014   HCT 35.1* 12/09/2014   MCV 110.4* 12/09/2014   PLT 189 12/09/2014   Chemistry (most recent): Lab Results  Component Value Date   NA 140 12/12/2014   K 4.2 12/12/2014   CL 105 12/12/2014   CO2 28 12/12/2014   BUN 21 12/12/2014   CREATININE 0.82 12/12/2014       Assessment & Plan:   1. Uncontrolled type 2 diabetes mellitus without complication, with long-term current use of insulin (HCC)   - patient remains at a high risk for more acute and chronic complications of diabetes which include CAD, CVA, CKD, retinopathy, and neuropathy. These are all discussed in detail with the patient.  Patient came with improved  glucose profile, and  recent A1c of 6.8 %.  Glucose logs and insulin administration records pertaining to this visit,  to be scanned into patient's records.  Recent labs reviewed.   - I have re-counseled the patient on diet management and weight loss  by adopting a carbohydrate restricted / protein rich  Diet.  - Suggestion is made for patient to avoid simple carbohydrates   from their diet including Cakes , Desserts, Ice Cream,  Soda (  diet and regular) , Sweet Tea , Candies,  Chips, Cookies, Artificial Sweeteners,   and "Sugar-free" Products .  This will help patient to have stable blood glucose profile and potentially avoid unintended  Weight gain.  - Patient is advised to stick to a routine mealtimes to eat 3 meals  a day and avoid unnecessary snacks ( to snack only to correct hypoglycemia).  - I have approached patient with the following individualized plan to manage diabetes and patient agrees.  - Increase Levemir to 35 units qam,  D/C  Novolog  For now. He will monitor BG BID. His care taker is encouraged to call clinic if he has blood glucose levels less than 70 or above 300 mg /dl.  -I will continue mtf   po BID.    - Patient specific target  for A1c; LDL, HDL, Triglycerides, and  Waist Circumference were discussed in detail.  2) BP/HTN:   Controlled . Continue current medications including ACEI/ARB. 3) Lipids/HPL:  continue statins. 4)  Weight/Diet: exercise, and carbohydrates information provided.  5) Chronic Care/Health Maintenance:  -Patient is  on ACEI/ARB and Statin medications and encouraged to continue  to follow up with Ophthalmology, Podiatrist at least yearly or according to recommendations, and advised to stay away from smoking. I have recommended yearly flu vaccine and pneumonia vaccination at least every 5 years; moderate intensity exercise for up to 150 minutes weekly; and  sleep for at least 7 hours a day.   - I advised patient to maintain close follow up with HAWKINS,EDWARD L, MD for primary care needs.  Patient is asked to bring meter and  blood glucose logs during their next visit.   Follow up plan: -Return in about 3 months (around 10/31/2015) for diabetes, high blood pressure, high cholesterol, follow up with pre-visit labs, meter, and logs.  Marquis Lunch, MD Phone: [phone]  Fax: [phone]   08/02/2015, 7:17 PM

## 2015-08-02 NOTE — Patient Instructions (Signed)

## 2015-08-16 DIAGNOSIS — H2513 Age-related nuclear cataract, bilateral: Secondary | ICD-10-CM | POA: Diagnosis not present

## 2015-08-16 DIAGNOSIS — H401132 Primary open-angle glaucoma, bilateral, moderate stage: Secondary | ICD-10-CM | POA: Diagnosis not present

## 2015-08-16 DIAGNOSIS — Z794 Long term (current) use of insulin: Secondary | ICD-10-CM | POA: Diagnosis not present

## 2015-08-16 DIAGNOSIS — E119 Type 2 diabetes mellitus without complications: Secondary | ICD-10-CM | POA: Diagnosis not present

## 2015-08-18 ENCOUNTER — Other Ambulatory Visit: Payer: Self-pay | Admitting: "Endocrinology

## 2015-08-22 DIAGNOSIS — R419 Unspecified symptoms and signs involving cognitive functions and awareness: Secondary | ICD-10-CM | POA: Diagnosis not present

## 2015-08-31 ENCOUNTER — Other Ambulatory Visit: Payer: Self-pay | Admitting: "Endocrinology

## 2015-09-05 DIAGNOSIS — B351 Tinea unguium: Secondary | ICD-10-CM | POA: Diagnosis not present

## 2015-09-05 DIAGNOSIS — E1342 Other specified diabetes mellitus with diabetic polyneuropathy: Secondary | ICD-10-CM | POA: Diagnosis not present

## 2015-09-05 DIAGNOSIS — L851 Acquired keratosis [keratoderma] palmaris et plantaris: Secondary | ICD-10-CM | POA: Diagnosis not present

## 2015-10-19 ENCOUNTER — Other Ambulatory Visit: Payer: Self-pay | Admitting: "Endocrinology

## 2015-11-01 ENCOUNTER — Ambulatory Visit: Payer: Medicare Other | Admitting: "Endocrinology

## 2015-11-14 DIAGNOSIS — L851 Acquired keratosis [keratoderma] palmaris et plantaris: Secondary | ICD-10-CM | POA: Diagnosis not present

## 2015-11-14 DIAGNOSIS — E1342 Other specified diabetes mellitus with diabetic polyneuropathy: Secondary | ICD-10-CM | POA: Diagnosis not present

## 2015-11-14 DIAGNOSIS — B351 Tinea unguium: Secondary | ICD-10-CM | POA: Diagnosis not present

## 2015-11-22 ENCOUNTER — Other Ambulatory Visit: Payer: Self-pay | Admitting: "Endocrinology

## 2015-11-28 ENCOUNTER — Other Ambulatory Visit: Payer: Self-pay | Admitting: "Endocrinology

## 2015-12-01 ENCOUNTER — Other Ambulatory Visit: Payer: Self-pay | Admitting: "Endocrinology

## 2015-12-01 DIAGNOSIS — E1165 Type 2 diabetes mellitus with hyperglycemia: Secondary | ICD-10-CM | POA: Diagnosis not present

## 2015-12-01 DIAGNOSIS — Z794 Long term (current) use of insulin: Secondary | ICD-10-CM | POA: Diagnosis not present

## 2015-12-01 LAB — BASIC METABOLIC PANEL
BUN: 12 mg/dL (ref 7–25)
CO2: 26 mmol/L (ref 20–31)
CREATININE: 0.75 mg/dL (ref 0.70–1.18)
Calcium: 9.1 mg/dL (ref 8.6–10.3)
Chloride: 103 mmol/L (ref 98–110)
GLUCOSE: 132 mg/dL — AB (ref 65–99)
POTASSIUM: 4.5 mmol/L (ref 3.5–5.3)
Sodium: 141 mmol/L (ref 135–146)

## 2015-12-01 LAB — HEMOGLOBIN A1C
Hgb A1c MFr Bld: 6.9 % — ABNORMAL HIGH (ref <5.7)
MEAN PLASMA GLUCOSE: 151 mg/dL

## 2015-12-08 ENCOUNTER — Encounter: Payer: Self-pay | Admitting: "Endocrinology

## 2015-12-08 ENCOUNTER — Ambulatory Visit (INDEPENDENT_AMBULATORY_CARE_PROVIDER_SITE_OTHER): Payer: Medicare Other | Admitting: "Endocrinology

## 2015-12-08 VITALS — BP 147/81 | HR 84 | Ht 68.0 in | Wt 209.0 lb

## 2015-12-08 DIAGNOSIS — IMO0001 Reserved for inherently not codable concepts without codable children: Secondary | ICD-10-CM

## 2015-12-08 DIAGNOSIS — E1165 Type 2 diabetes mellitus with hyperglycemia: Secondary | ICD-10-CM | POA: Diagnosis not present

## 2015-12-08 DIAGNOSIS — I10 Essential (primary) hypertension: Secondary | ICD-10-CM

## 2015-12-08 DIAGNOSIS — Z794 Long term (current) use of insulin: Secondary | ICD-10-CM

## 2015-12-08 DIAGNOSIS — E559 Vitamin D deficiency, unspecified: Secondary | ICD-10-CM | POA: Diagnosis not present

## 2015-12-08 DIAGNOSIS — E785 Hyperlipidemia, unspecified: Secondary | ICD-10-CM | POA: Diagnosis not present

## 2015-12-08 NOTE — Patient Instructions (Signed)

## 2015-12-08 NOTE — Progress Notes (Signed)
Subjective:    Patient ID: Andre Rowe, male    DOB: April 13, 1941, PCP Fredirick MaudlinHAWKINS,EDWARD L, MD   Past Medical History  Diagnosis Date  . High cholesterol   . GERD (gastroesophageal reflux disease)   . Hypertension   . Alzheimer disease   . Diabetes mellitus without complication (HCC)   . Schizophrenia (HCC)   . Dementia   . Overactive bladder    Past Surgical History  Procedure Laterality Date  . Unable     Social History   Social History  . Marital Status: Single    Spouse Name: N/A  . Number of Children: N/A  . Years of Education: N/A   Social History Main Topics  . Smoking status: Current Every Day Smoker  . Smokeless tobacco: None  . Alcohol Use: No  . Drug Use: No  . Sexual Activity: Not Asked   Other Topics Concern  . None   Social History Narrative   Outpatient Encounter Prescriptions as of 12/08/2015  Medication Sig  . bimatoprost (LUMIGAN) 0.01 % SOLN Place 1 drop into both eyes at bedtime.  . carbamazepine (TEGRETOL) 200 MG tablet Take 200 mg by mouth 3 (three) times daily.  . clonazePAM (KLONOPIN) 0.5 MG tablet Take 0.5 mg by mouth 3 (three) times daily.  Marland Kitchen. donepezil (ARICEPT) 10 MG tablet Take 10 mg by mouth at bedtime.  . folic acid (FOLVITE) 1 MG tablet Take 1 mg by mouth daily.  . insulin detemir (LEVEMIR) 100 UNIT/ML injection Inject 35 Units into the skin every morning.  Marland Kitchen. lisinopril (PRINIVIL,ZESTRIL) 10 MG tablet Take 10 mg by mouth daily.  . memantine (NAMENDA) 10 MG tablet Take 10 mg by mouth 2 (two) times daily.  . metFORMIN (GLUCOPHAGE) 500 MG tablet Take 1,000 mg by mouth 2 (two) times daily after a meal.   . OLANZapine (ZYPREXA) 10 MG tablet Take 10 mg by mouth at bedtime.  Marland Kitchen. omeprazole (PRILOSEC) 20 MG capsule Take 20 mg by mouth daily.  Marland Kitchen. oxybutynin (DITROPAN) 5 MG tablet Take 5 mg by mouth 2 (two) times daily.  Marland Kitchen. senna (SENOKOT) 8.6 MG TABS tablet Take 1 tablet by mouth at bedtime.  . simvastatin (ZOCOR) 40 MG tablet Take 40 mg  by mouth every evening.  . tamsulosin (FLOMAX) 0.4 MG CAPS Take 0.4 mg by mouth daily.  . Vitamin D, Ergocalciferol, (DRISDOL) 50000 UNITS CAPS capsule Take 50,000 Units by mouth every 7 (seven) days.  . [DISCONTINUED] sulfamethoxazole-trimethoprim (BACTRIM) 400-80 MG per tablet Take 1 tablet by mouth 2 (two) times daily.   No facility-administered encounter medications on file as of 12/08/2015.   ALLERGIES: No Known Allergies VACCINATION STATUS:  There is no immunization history on file for this patient.  Diabetes He presents for his follow-up diabetic visit. He has type 2 diabetes mellitus. Onset time: he was diagnosed at age 75 yrs. His disease course has been stable. There are no hypoglycemic associated symptoms. Pertinent negatives for hypoglycemia include no confusion, headaches, pallor or seizures. There are no diabetic associated symptoms. Pertinent negatives for diabetes include no chest pain, no fatigue, no polydipsia, no polyphagia, no polyuria and no weakness. There are no hypoglycemic complications. Symptoms are stable. There are no diabetic complications. Risk factors for coronary artery disease include diabetes mellitus, dyslipidemia, hypertension, male sex, obesity, sedentary lifestyle and tobacco exposure. Current diabetic treatment includes insulin injections and oral agent (monotherapy). He is compliant with treatment most of the time. His weight is stable. He is following a generally  unhealthy diet. He never participates in exercise. His breakfast blood glucose range is generally 140-180 mg/dl. His lunch blood glucose range is generally 140-180 mg/dl. His dinner blood glucose range is generally 140-180 mg/dl. His overall blood glucose range is 140-180 mg/dl. An ACE inhibitor/angiotensin II receptor blocker is being taken.  Hyperlipidemia This is a chronic problem. The current episode started more than 1 year ago. The problem is controlled. Exacerbating diseases include diabetes and  obesity. Pertinent negatives include no chest pain, myalgias or shortness of breath. Current antihyperlipidemic treatment includes statins. Risk factors for coronary artery disease include diabetes mellitus, dyslipidemia, hypertension, male sex, obesity and a sedentary lifestyle.  Hypertension This is a chronic problem. The current episode started more than 1 year ago. The problem is controlled. Pertinent negatives include no chest pain, headaches, neck pain, palpitations or shortness of breath. Risk factors for coronary artery disease include diabetes mellitus, dyslipidemia, smoking/tobacco exposure and obesity. Past treatments include ACE inhibitors.     Review of Systems  Constitutional: Negative for fatigue and unexpected weight change.  HENT: Negative for dental problem, mouth sores and trouble swallowing.   Eyes: Negative for visual disturbance.  Respiratory: Negative for cough, choking, chest tightness, shortness of breath and wheezing.   Cardiovascular: Negative for chest pain, palpitations and leg swelling.  Gastrointestinal: Negative for nausea, vomiting, abdominal pain, diarrhea, constipation and abdominal distention.  Endocrine: Negative for polydipsia, polyphagia and polyuria.  Genitourinary: Negative for dysuria, urgency, hematuria and flank pain.  Musculoskeletal: Negative for myalgias, back pain, gait problem and neck pain.  Skin: Negative for pallor, rash and wound.  Neurological: Negative for seizures, syncope, weakness, numbness and headaches.  Psychiatric/Behavioral: Negative.  Negative for confusion and dysphoric mood.    Objective:    BP 147/81 mmHg  Pulse 84  Ht 5\' 8"  (1.727 m)  Wt 209 lb (94.802 kg)  BMI 31.79 kg/m2  SpO2 96%  Wt Readings from Last 3 Encounters:  12/08/15 209 lb (94.802 kg)  08/02/15 210 lb (95.255 kg)  12/12/14 201 lb 3.2 oz (91.264 kg)    Physical Exam  Constitutional: He is oriented to person, place, and time. He appears well-developed  and well-nourished. He is cooperative. No distress.  HENT:  Head: Normocephalic and atraumatic.  Eyes: EOM are normal.  Neck: Normal range of motion. Neck supple. No tracheal deviation present. No thyromegaly present.  Cardiovascular: Normal rate, S1 normal, S2 normal and normal heart sounds.  Exam reveals no gallop.   No murmur heard. Pulses:      Dorsalis pedis pulses are 1+ on the right side, and 1+ on the left side.       Posterior tibial pulses are 1+ on the right side, and 1+ on the left side.  Pulmonary/Chest: Breath sounds normal. No respiratory distress. He has no wheezes.  Abdominal: Soft. Bowel sounds are normal. He exhibits no distension. There is no tenderness. There is no guarding and no CVA tenderness.  Musculoskeletal: He exhibits no edema.       Right shoulder: He exhibits no swelling and no deformity.  Neurological: He is alert and oriented to person, place, and time. He has normal strength and normal reflexes. No cranial nerve deficit or sensory deficit. Gait normal.  Skin: Skin is warm and dry. No rash noted. No cyanosis. Nails show no clubbing.  Psychiatric: He has a normal mood and affect. His speech is normal and behavior is normal. Judgment and thought content normal. Cognition and memory are normal.  Recent Results (from the past 2160 hour(s))  Basic metabolic panel     Status: Abnormal   Collection Time: 12/01/15  8:18 AM  Result Value Ref Range   Sodium 141 135 - 146 mmol/L   Potassium 4.5 3.5 - 5.3 mmol/L   Chloride 103 98 - 110 mmol/L   CO2 26 20 - 31 mmol/L   Glucose, Bld 132 (H) 65 - 99 mg/dL   BUN 12 7 - 25 mg/dL   Creat 0.98 1.19 - 1.47 mg/dL   Calcium 9.1 8.6 - 82.9 mg/dL  Hemoglobin F6O     Status: Abnormal   Collection Time: 12/01/15  8:18 AM  Result Value Ref Range   Hgb A1c MFr Bld 6.9 (H) <5.7 %    Comment:   For someone without known diabetes, a hemoglobin A1c value of 6.5% or greater indicates that they may have diabetes and this should  be confirmed with a follow-up test.   For someone with known diabetes, a value <7% indicates that their diabetes is well controlled and a value greater than or equal to 7% indicates suboptimal control. A1c targets should be individualized based on duration of diabetes, age, comorbid conditions, and other considerations.   Currently, no consensus exists for use of hemoglobin A1c for diagnosis of diabetes for children.      Mean Plasma Glucose 151 mg/dL     Assessment & Plan:   1. Uncontrolled type 2 diabetes mellitus without complication, with long-term current use of insulin (HCC)  - patient remains at a high risk for more acute and chronic complications of diabetes which include CAD, CVA, CKD, retinopathy, and neuropathy. These are all discussed in detail with the patient.  Patient came with improved  glucose profile, and  recent A1c of 6.9 %.  Glucose logs and insulin administration records pertaining to this visit,  to be scanned into patient's records.  Recent labs reviewed.   - I have re-counseled the patient on diet management and weight loss  by adopting a carbohydrate restricted / protein rich  Diet.  - Suggestion is made for patient to avoid simple carbohydrates   from their diet including Cakes , Desserts, Ice Cream,  Soda (  diet and regular) , Sweet Tea , Candies,  Chips, Cookies, Artificial Sweeteners,   and "Sugar-free" Products .  This will help patient to have stable blood glucose profile and potentially avoid unintended  Weight gain.  - Patient is advised to stick to a routine mealtimes to eat 3 meals  a day and avoid unnecessary snacks ( to snack only to correct hypoglycemia).  - I have approached patient with the following individualized plan to manage diabetes and patient agrees.  - Continue Levemir to 35 units qam. He will monitor BG BID. His care taker is encouraged to call clinic if he has blood glucose levels less than 70 or above 300 mg /dl.  -I will  continue metformin  po BID.    - Patient specific target  for A1c; LDL, HDL, Triglycerides, and  Waist Circumference were discussed in detail.  2) BP/HTN:   Controlled . Continue current medications including ACEI/ARB. 3) Lipids/HPL:  continue statins. 4)  Weight/Diet: exercise, and carbohydrates information provided.  5) Chronic Care/Health Maintenance:  -Patient is  on ACEI/ARB and Statin medications and encouraged to continue to follow up with Ophthalmology, Podiatrist at least yearly or according to recommendations, and advised to stay away from smoking. I have recommended yearly flu vaccine and pneumonia vaccination  at least every 5 years; moderate intensity exercise for up to 150 minutes weekly; and  sleep for at least 7 hours a day.   - I advised patient to maintain close follow up with HAWKINS,EDWARD L, MD for primary care needs.  Patient is asked to bring meter and  blood glucose logs during their next visit.   Follow up plan: -Return in about 3 months (around 03/08/2016) for diabetes, high blood pressure, high cholesterol, follow up with pre-visit labs, meter, and logs.  Marquis Lunch, MD Phone: [phone]  Fax: [phone]   12/08/2015, 1:23 PM

## 2015-12-11 DIAGNOSIS — F250 Schizoaffective disorder, bipolar type: Secondary | ICD-10-CM | POA: Diagnosis not present

## 2015-12-20 ENCOUNTER — Other Ambulatory Visit: Payer: Self-pay | Admitting: "Endocrinology

## 2016-01-03 ENCOUNTER — Other Ambulatory Visit: Payer: Self-pay | Admitting: "Endocrinology

## 2016-01-10 DIAGNOSIS — F489 Nonpsychotic mental disorder, unspecified: Secondary | ICD-10-CM | POA: Diagnosis not present

## 2016-01-10 DIAGNOSIS — J449 Chronic obstructive pulmonary disease, unspecified: Secondary | ICD-10-CM | POA: Diagnosis not present

## 2016-01-10 DIAGNOSIS — E119 Type 2 diabetes mellitus without complications: Secondary | ICD-10-CM | POA: Diagnosis not present

## 2016-01-10 DIAGNOSIS — N138 Other obstructive and reflux uropathy: Secondary | ICD-10-CM | POA: Diagnosis not present

## 2016-01-30 DIAGNOSIS — E1342 Other specified diabetes mellitus with diabetic polyneuropathy: Secondary | ICD-10-CM | POA: Diagnosis not present

## 2016-01-30 DIAGNOSIS — B351 Tinea unguium: Secondary | ICD-10-CM | POA: Diagnosis not present

## 2016-01-30 DIAGNOSIS — L851 Acquired keratosis [keratoderma] palmaris et plantaris: Secondary | ICD-10-CM | POA: Diagnosis not present

## 2016-02-22 ENCOUNTER — Other Ambulatory Visit: Payer: Self-pay

## 2016-02-22 ENCOUNTER — Other Ambulatory Visit: Payer: Self-pay | Admitting: "Endocrinology

## 2016-02-22 MED ORDER — VITAMIN D (ERGOCALCIFEROL) 1.25 MG (50000 UNIT) PO CAPS: 50000.0000 [IU] | ORAL_CAPSULE | ORAL | Status: DC

## 2016-02-22 MED ORDER — METFORMIN HCL 1000 MG PO TABS: 1000.0000 mg | ORAL_TABLET | Freq: Two times a day (BID) | ORAL | Status: DC

## 2016-02-29 ENCOUNTER — Other Ambulatory Visit: Payer: Self-pay

## 2016-02-29 ENCOUNTER — Other Ambulatory Visit: Payer: Self-pay | Admitting: "Endocrinology

## 2016-02-29 MED ORDER — INSULIN DETEMIR 100 UNIT/ML ~~LOC~~ SOLN: 35.0000 [IU] | Freq: Every morning | SUBCUTANEOUS | Status: DC

## 2016-03-07 ENCOUNTER — Other Ambulatory Visit: Payer: Self-pay | Admitting: "Endocrinology

## 2016-03-07 DIAGNOSIS — E1165 Type 2 diabetes mellitus with hyperglycemia: Secondary | ICD-10-CM | POA: Diagnosis not present

## 2016-03-07 DIAGNOSIS — Z794 Long term (current) use of insulin: Secondary | ICD-10-CM | POA: Diagnosis not present

## 2016-03-07 LAB — COMPLETE METABOLIC PANEL WITH GFR
ALT: 9 U/L (ref 9–46)
AST: 13 U/L (ref 10–35)
Albumin: 3.6 g/dL (ref 3.6–5.1)
Alkaline Phosphatase: 107 U/L (ref 40–115)
BUN: 17 mg/dL (ref 7–25)
CO2: 26 mmol/L (ref 20–31)
Calcium: 8.7 mg/dL (ref 8.6–10.3)
Chloride: 103 mmol/L (ref 98–110)
Creat: 0.78 mg/dL (ref 0.70–1.18)
Glucose, Bld: 112 mg/dL — ABNORMAL HIGH (ref 65–99)
Potassium: 4.4 mmol/L (ref 3.5–5.3)
Sodium: 139 mmol/L (ref 135–146)
Total Bilirubin: 0.2 mg/dL (ref 0.2–1.2)
Total Protein: 5.9 g/dL — ABNORMAL LOW (ref 6.1–8.1)

## 2016-03-08 ENCOUNTER — Ambulatory Visit: Payer: Medicare Other | Admitting: "Endocrinology

## 2016-03-08 LAB — HEMOGLOBIN A1C
HEMOGLOBIN A1C: 6.6 % — AB (ref <5.7)
MEAN PLASMA GLUCOSE: 143 mg/dL

## 2016-03-19 ENCOUNTER — Ambulatory Visit (INDEPENDENT_AMBULATORY_CARE_PROVIDER_SITE_OTHER): Payer: Medicare Other | Admitting: "Endocrinology

## 2016-03-19 ENCOUNTER — Encounter: Payer: Self-pay | Admitting: "Endocrinology

## 2016-03-19 VITALS — BP 131/89 | HR 99 | Ht 68.0 in | Wt 208.0 lb

## 2016-03-19 DIAGNOSIS — IMO0001 Reserved for inherently not codable concepts without codable children: Secondary | ICD-10-CM

## 2016-03-19 DIAGNOSIS — Z794 Long term (current) use of insulin: Secondary | ICD-10-CM | POA: Diagnosis not present

## 2016-03-19 DIAGNOSIS — I10 Essential (primary) hypertension: Secondary | ICD-10-CM | POA: Diagnosis not present

## 2016-03-19 DIAGNOSIS — E1165 Type 2 diabetes mellitus with hyperglycemia: Secondary | ICD-10-CM | POA: Diagnosis not present

## 2016-03-19 DIAGNOSIS — E785 Hyperlipidemia, unspecified: Secondary | ICD-10-CM

## 2016-03-19 MED ORDER — INSULIN DETEMIR 100 UNIT/ML ~~LOC~~ SOLN: 30.0000 [IU] | Freq: Every morning | SUBCUTANEOUS | 2 refills | Status: DC

## 2016-03-19 NOTE — Progress Notes (Signed)
Subjective:    Patient ID: Andre Rowe, male    DOB: [birth year], PCP Fredirick Maudlin, MD   Past Medical History:  Diagnosis Date  . Alzheimer disease   . Dementia   . Diabetes mellitus without complication (HCC)   . GERD (gastroesophageal reflux disease)   . High cholesterol   . Hypertension   . Overactive bladder   . Schizophrenia [facility])    Past Surgical History:  Procedure Laterality Date  . unable     Social History   Social History  . Marital status: Single    Spouse name: N/A  . Number of children: N/A  . Years of education: N/A   Social History Main Topics  . Smoking status: Current Every Day Smoker  . Smokeless tobacco: None  . Alcohol use No  . Drug use: No  . Sexual activity: Not Asked   Other Topics Concern  . None   Social History Narrative  . None   Outpatient Encounter Prescriptions as of 03/19/2016  Medication Sig  . bimatoprost (LUMIGAN) 0.01 % SOLN Place 1 drop into both eyes at bedtime.  . carbamazepine (TEGRETOL) 200 MG tablet Take 200 mg by mouth 3 (three) times daily.  . clonazePAM (KLONOPIN) 0.5 MG tablet Take 0.5 mg by mouth 3 (three) times daily.  Marland Kitchen donepezil (ARICEPT) 10 MG tablet Take 10 mg by mouth at bedtime.  . folic acid (FOLVITE) 1 MG tablet Take 1 mg by mouth daily.  . insulin detemir (LEVEMIR) 100 UNIT/ML injection Inject 0.3 mLs (30 Units total) into the skin every morning.  Marland Kitchen lisinopril (PRINIVIL,ZESTRIL) 10 MG tablet Take 10 mg by mouth daily.  . memantine (NAMENDA) 10 MG tablet Take 10 mg by mouth 2 (two) times daily.  . metFORMIN (GLUCOPHAGE) 1000 MG tablet Take 1 tablet (1,000 mg total) by mouth 2 (two) times daily with a meal.  . OLANZapine (ZYPREXA) 10 MG tablet Take 10 mg by mouth at bedtime.  Marland Kitchen omeprazole (PRILOSEC) 20 MG capsule Take 20 mg by mouth daily.  Marland Kitchen oxybutynin (DITROPAN) 5 MG tablet Take 5 mg by mouth 2 (two) times daily.  Marland Kitchen senna (SENOKOT) 8.6 MG TABS tablet Take 1 tablet by mouth at bedtime.  .  simvastatin (ZOCOR) 40 MG tablet Take 40 mg by mouth every evening.  . tamsulosin (FLOMAX) 0.4 MG CAPS Take 0.4 mg by mouth daily.  . Vitamin D, Ergocalciferol, (DRISDOL) 50000 units CAPS capsule Take 1 capsule (50,000 Units total) by mouth every 7 (seven) days.  . [DISCONTINUED] insulin detemir (LEVEMIR) 100 UNIT/ML injection Inject 0.35 mLs (35 Units total) into the skin every morning.   No facility-administered encounter medications on file as of 03/19/2016.    ALLERGIES: No Known Allergies VACCINATION STATUS:  There is no immunization history on file for this patient.  Diabetes  He presents for his follow-up diabetic visit. He has type 2 diabetes mellitus. Onset time: he was diagnosed at age 89 yrs. His disease course has been improving. There are no hypoglycemic associated symptoms. Pertinent negatives for hypoglycemia include no confusion, headaches, pallor or seizures. There are no diabetic associated symptoms. Pertinent negatives for diabetes include no chest pain, no fatigue, no polydipsia, no polyphagia, no polyuria and no weakness. There are no hypoglycemic complications. Symptoms are improving. There are no diabetic complications. Risk factors for coronary artery disease include diabetes mellitus, dyslipidemia, hypertension, male sex, obesity, sedentary lifestyle and tobacco exposure. Current diabetic treatment includes insulin injections and oral agent (monotherapy). He is compliant  with treatment most of the time. His weight is stable. He is following a generally unhealthy diet. He never participates in exercise. His breakfast blood glucose range is generally 140-180 mg/dl. His dinner blood glucose range is generally 110-130 mg/dl. His overall blood glucose range is 130-140 mg/dl. An ACE inhibitor/angiotensin II receptor blocker is being taken.  Hyperlipidemia  This is a chronic problem. The current episode started more than 1 year ago. The problem is controlled. Exacerbating diseases  include diabetes and obesity. Pertinent negatives include no chest pain, myalgias or shortness of breath. Current antihyperlipidemic treatment includes statins. Risk factors for coronary artery disease include diabetes mellitus, dyslipidemia, hypertension, male sex, obesity and a sedentary lifestyle.  Hypertension  This is a chronic problem. The current episode started more than 1 year ago. The problem is controlled. Pertinent negatives include no chest pain, headaches, neck pain, palpitations or shortness of breath. Risk factors for coronary artery disease include diabetes mellitus, dyslipidemia, smoking/tobacco exposure and obesity. Past treatments include ACE inhibitors.     Review of Systems  Constitutional: Negative for fatigue and unexpected weight change.  HENT: Negative for dental problem, mouth sores and trouble swallowing.   Eyes: Negative for visual disturbance.  Respiratory: Negative for cough, choking, chest tightness, shortness of breath and wheezing.   Cardiovascular: Negative for chest pain, palpitations and leg swelling.  Gastrointestinal: Negative for abdominal distention, abdominal pain, constipation, diarrhea, nausea and vomiting.  Endocrine: Negative for polydipsia, polyphagia and polyuria.  Genitourinary: Negative for dysuria, flank pain, hematuria and urgency.  Musculoskeletal: Negative for back pain, gait problem, myalgias and neck pain.  Skin: Negative for pallor, rash and wound.  Neurological: Negative for seizures, syncope, weakness, numbness and headaches.  Psychiatric/Behavioral: Negative.  Negative for confusion and dysphoric mood.    Objective:    BP 131/89   Pulse 99   Ht  (1.727 m)   Wt 208 lb (94.3 kg)   SpO2 99%   BMI 31.63 kg/m   Wt Readings from Last 3 Encounters:  03/19/16 208 lb (94.3 kg)  12/08/15 209 lb (94.8 kg)  08/02/15 210 lb (95.3 kg)    Physical Exam  Constitutional: He is oriented to person, place, and time. He appears  well-developed and well-nourished. He is cooperative. No distress.  HENT:  Head: Normocephalic and atraumatic.  Eyes: EOM are normal.  Neck: Normal range of motion. Neck supple. No tracheal deviation present. No thyromegaly present.  Cardiovascular: Normal rate, S1 normal, S2 normal and normal heart sounds.  Exam reveals no gallop.   No murmur heard. Pulses:      Dorsalis pedis pulses are 1+ on the right side, and 1+ on the left side.       Posterior tibial pulses are 1+ on the right side, and 1+ on the left side.  Pulmonary/Chest: Breath sounds normal. No respiratory distress. He has no wheezes.  Abdominal: Soft. Bowel sounds are normal. He exhibits no distension. There is no tenderness. There is no guarding and no CVA tenderness.  Musculoskeletal: He exhibits no edema.       Right shoulder: He exhibits no swelling and no deformity.  Neurological: He is alert and oriented to person, place, and time. He has normal strength and normal reflexes. No cranial nerve deficit or sensory deficit. Gait normal.  Skin: Skin is warm and dry. No rash noted. No cyanosis. Nails show no clubbing.  Psychiatric: He has a normal mood and affect. His speech is normal and behavior is normal. Judgment and  thought content normal. Cognition and memory are normal.    Recent Results (from the past 2160 hour(s))  COMPLETE METABOLIC PANEL WITH GFR     Status: Abnormal   Collection Time: 03/07/16  8:04 AM  Result Value Ref Range   Sodium 139 135 - 146 mmol/L   Potassium 4.4 3.5 - 5.3 mmol/L   Chloride 103 98 - 110 mmol/L   CO2 26 20 - 31 mmol/L   Glucose, Bld 112 (H) 65 - 99 mg/dL   BUN 17 7 - 25 mg/dL   Creat 6.01 5.61 - 5.37 mg/dL    Comment:   For patients > or = 73 years of age: The upper reference limit for Creatinine is approximately 13% higher for people identified as African-American.      Total Bilirubin 0.2 0.2 - 1.2 mg/dL   Alkaline Phosphatase 107 40 - 115 U/L   AST 13 10 - 35 U/L   ALT 9 9 - 46  U/L   Total Protein 5.9 (L) 6.1 - 8.1 g/dL   Albumin 3.6 3.6 - 5.1 g/dL   Calcium 8.7 8.6 - 94.3 mg/dL   GFR, Est African American >89 >=60 mL/min   GFR, Est Non African American >89 >=60 mL/min  Hemoglobin A1c     Status: Abnormal   Collection Time: 03/07/16  8:04 AM  Result Value Ref Range   Hgb A1c MFr Bld 6.6 (H) <5.7 %    Comment:   For someone without known diabetes, a hemoglobin A1c value of 6.5% or greater indicates that they may have diabetes and this should be confirmed with a follow-up test.   For someone with known diabetes, a value <7% indicates that their diabetes is well controlled and a value greater than or equal to 7% indicates suboptimal control. A1c targets should be individualized based on duration of diabetes, age, comorbid conditions, and other considerations.   Currently, no consensus exists for use of hemoglobin A1c for diagnosis of diabetes for children.      Mean Plasma Glucose 143 mg/dL     Assessment & Plan:   1. Uncontrolled type 2 diabetes mellitus without complication, with long-term current use of insulin (HCC)  - patient remains at a high risk for more acute and chronic complications of diabetes which include CAD, CVA, CKD, retinopathy, and neuropathy. These are all discussed in detail with the patient.  Patient came with improved  glucose profile, and  recent A1c of 6.6 %.  Glucose logs and insulin administration records pertaining to this visit,  to be scanned into patient's records.  Recent labs reviewed.   - I have re-counseled the patient on diet management and weight loss  by adopting a carbohydrate restricted / protein rich  Diet.  - Suggestion is made for patient to avoid simple carbohydrates   from their diet including Cakes , Desserts, Ice Cream,  Soda (  diet and regular) , Sweet Tea , Candies,  Chips, Cookies, Artificial Sweeteners,   and "Sugar-free" Products .  This will help patient to have stable blood glucose profile and  potentially avoid unintended  Weight gain.  - Patient is advised to stick to a routine mealtimes to eat 3 meals  a day and avoid unnecessary snacks ( to snack only to correct hypoglycemia).  - I have approached patient with the following individualized plan to manage diabetes and patient agrees.  - decrease Levemir to 30 units qam. He will monitor BG BID. His care taker is encouraged to  call clinic if he has blood glucose levels less than 70 or above 300 mg /dl.  -I will continue metformin 1000mg  po BID.    - Patient specific target  for A1c; LDL, HDL, Triglycerides, and  Waist Circumference were discussed in detail.  2) BP/HTN:   Controlled . Continue current medications including ACEI/ARB. 3) Lipids/HPL:  continue statins. 4)  Weight/Diet: exercise, and carbohydrates information provided.  5) Chronic Care/Health Maintenance:  -Patient is  on ACEI/ARB and Statin medications and encouraged to continue to follow up with Ophthalmology, Podiatrist at least yearly or according to recommendations, and advised to quit  smoking. I have recommended yearly flu vaccine and pneumonia vaccination at least every 5 years; moderate intensity exercise for up to 150 minutes weekly; and  sleep for at least 7 hours a day.   - I advised patient to maintain close follow up with HAWKINS,EDWARD L, MD for primary care needs.  Patient is asked to bring meter and  blood glucose logs during their next visit.   Follow up plan: -Return in about 3 months (around 06/19/2016) for follow up with pre-visit labs, meter, and logs.  Marquis Lunch, MD Phone: [phone]  Fax: [phone]   03/19/2016, 11:51 AM

## 2016-04-01 DIAGNOSIS — R419 Unspecified symptoms and signs involving cognitive functions and awareness: Secondary | ICD-10-CM | POA: Diagnosis not present

## 2016-04-09 DIAGNOSIS — B351 Tinea unguium: Secondary | ICD-10-CM | POA: Diagnosis not present

## 2016-04-09 DIAGNOSIS — E1342 Other specified diabetes mellitus with diabetic polyneuropathy: Secondary | ICD-10-CM | POA: Diagnosis not present

## 2016-04-09 DIAGNOSIS — L851 Acquired keratosis [keratoderma] palmaris et plantaris: Secondary | ICD-10-CM | POA: Diagnosis not present

## 2016-05-21 ENCOUNTER — Other Ambulatory Visit: Payer: Self-pay | Admitting: "Endocrinology

## 2016-06-07 ENCOUNTER — Other Ambulatory Visit: Payer: Self-pay | Admitting: "Endocrinology

## 2016-06-18 DIAGNOSIS — B351 Tinea unguium: Secondary | ICD-10-CM | POA: Diagnosis not present

## 2016-06-18 DIAGNOSIS — L851 Acquired keratosis [keratoderma] palmaris et plantaris: Secondary | ICD-10-CM | POA: Diagnosis not present

## 2016-06-18 DIAGNOSIS — E1342 Other specified diabetes mellitus with diabetic polyneuropathy: Secondary | ICD-10-CM | POA: Diagnosis not present

## 2016-06-24 ENCOUNTER — Ambulatory Visit: Payer: Medicare Other | Admitting: "Endocrinology

## 2016-06-26 ENCOUNTER — Other Ambulatory Visit: Payer: Self-pay | Admitting: "Endocrinology

## 2016-06-26 DIAGNOSIS — Z794 Long term (current) use of insulin: Secondary | ICD-10-CM | POA: Diagnosis not present

## 2016-06-26 DIAGNOSIS — E1165 Type 2 diabetes mellitus with hyperglycemia: Secondary | ICD-10-CM | POA: Diagnosis not present

## 2016-06-26 LAB — COMPLETE METABOLIC PANEL WITH GFR
ALT: 15 U/L (ref 9–46)
AST: 12 U/L (ref 10–35)
Albumin: 3.9 g/dL (ref 3.6–5.1)
Alkaline Phosphatase: 83 U/L (ref 40–115)
BUN: 18 mg/dL (ref 7–25)
CALCIUM: 8.8 mg/dL (ref 8.6–10.3)
CHLORIDE: 106 mmol/L (ref 98–110)
CO2: 29 mmol/L (ref 20–31)
Creat: 0.85 mg/dL (ref 0.70–1.18)
GFR, Est African American: >89 mL/min (ref >=60)
GFR, Est Non African American: 86 mL/min (ref >=60)
Glucose, Bld: 114 mg/dL — ABNORMAL HIGH (ref 65–99)
POTASSIUM: 4.4 mmol/L (ref 3.5–5.3)
Sodium: 141 mmol/L (ref 135–146)
Total Bilirubin: 0.3 mg/dL (ref 0.2–1.2)
Total Protein: 5.9 g/dL — ABNORMAL LOW (ref 6.1–8.1)

## 2016-06-26 LAB — HEMOGLOBIN A1C
HEMOGLOBIN A1C: 6.1 % — AB (ref <5.7)
MEAN PLASMA GLUCOSE: 128 mg/dL

## 2016-06-27 ENCOUNTER — Ambulatory Visit: Payer: Medicare Other | Admitting: "Endocrinology

## 2016-07-03 ENCOUNTER — Encounter: Payer: Self-pay | Admitting: "Endocrinology

## 2016-07-03 ENCOUNTER — Ambulatory Visit (INDEPENDENT_AMBULATORY_CARE_PROVIDER_SITE_OTHER): Payer: Medicare Other | Admitting: "Endocrinology

## 2016-07-03 VITALS — HR 72 | Ht 68.0 in | Wt 199.0 lb

## 2016-07-03 DIAGNOSIS — I10 Essential (primary) hypertension: Secondary | ICD-10-CM | POA: Diagnosis not present

## 2016-07-03 DIAGNOSIS — E1165 Type 2 diabetes mellitus with hyperglycemia: Secondary | ICD-10-CM

## 2016-07-03 DIAGNOSIS — E782 Mixed hyperlipidemia: Secondary | ICD-10-CM | POA: Diagnosis not present

## 2016-07-03 DIAGNOSIS — Z794 Long term (current) use of insulin: Secondary | ICD-10-CM

## 2016-07-03 DIAGNOSIS — IMO0001 Reserved for inherently not codable concepts without codable children: Secondary | ICD-10-CM

## 2016-07-03 MED ORDER — INSULIN DETEMIR 100 UNIT/ML ~~LOC~~ SOLN: 20.0000 [IU] | Freq: Every morning | SUBCUTANEOUS | 2 refills | Status: DC

## 2016-07-03 NOTE — Progress Notes (Signed)
Subjective:    Patient ID: Andre Rowe, male    DOB: 04-27-1941, PCP Fredirick MaudlinHAWKINS,EDWARD L, MD   Past Medical History:  Diagnosis Date  . Alzheimer disease   . Dementia   . Diabetes mellitus without complication (HCC)   . GERD (gastroesophageal reflux disease)   . High cholesterol   . Hypertension   . Overactive bladder   . Schizophrenia [facility](HCC)    Past Surgical History:  Procedure Laterality Date  . unable     Social History   Social History  . Marital status: Single    Spouse name: N/A  . Number of children: N/A  . Years of education: N/A   Social History Main Topics  . Smoking status: Current Every Day Smoker  . Smokeless tobacco: Never Used  . Alcohol use No  . Drug use: No  . Sexual activity: Not Asked   Other Topics Concern  . None   Social History Narrative  . None   Outpatient Encounter Prescriptions as of 07/03/2016  Medication Sig  . bimatoprost (LUMIGAN) 0.01 % SOLN Place 1 drop into both eyes at bedtime.  . carbamazepine (TEGRETOL) 200 MG tablet Take 200 mg by mouth 3 (three) times daily.  . clonazePAM (KLONOPIN) 0.5 MG tablet Take 0.5 mg by mouth 3 (three) times daily.  Marland Kitchen. donepezil (ARICEPT) 10 MG tablet Take 10 mg by mouth at bedtime.  . folic acid (FOLVITE) 1 MG tablet Take 1 mg by mouth daily.  . insulin detemir (LEVEMIR) 100 UNIT/ML injection Inject 0.2 mLs (20 Units total) into the skin every morning.  Marland Kitchen. lisinopril (PRINIVIL,ZESTRIL) 10 MG tablet Take 10 mg by mouth daily.  . memantine (NAMENDA) 10 MG tablet Take 10 mg by mouth 2 (two) times daily.  . metFORMIN (GLUCOPHAGE) 1000 MG tablet Take 1 tablet (1,000 mg total) by mouth 2 (two) times daily with a meal.  . OLANZapine (ZYPREXA) 10 MG tablet Take 10 mg by mouth at bedtime.  Marland Kitchen. omeprazole (PRILOSEC) 20 MG capsule Take 20 mg by mouth daily.  Marland Kitchen. oxybutynin (DITROPAN) 5 MG tablet Take 5 mg by mouth 2 (two) times daily.  Marland Kitchen. senna (SENOKOT) 8.6 MG TABS tablet Take 1 tablet by mouth at bedtime.   . simvastatin (ZOCOR) 40 MG tablet Take 40 mg by mouth every evening.  . tamsulosin (FLOMAX) 0.4 MG CAPS Take 0.4 mg by mouth daily.  . Vitamin D, Ergocalciferol, (DRISDOL) 50000 units CAPS capsule Take 1 capsule (50,000 Units total) by mouth every 7 (seven) days.  . [DISCONTINUED] insulin detemir (LEVEMIR) 100 UNIT/ML injection Inject 0.3 mLs (30 Units total) into the skin every morning.   No facility-administered encounter medications on file as of 07/03/2016.    ALLERGIES: No Known Allergies VACCINATION STATUS:  There is no immunization history on file for this patient.  Diabetes  He presents for his follow-up diabetic visit. He has type 2 diabetes mellitus. Onset time: he was diagnosed at age 75 yrs. His disease course has been improving. There are no hypoglycemic associated symptoms. Pertinent negatives for hypoglycemia include no confusion, headaches, pallor or seizures. There are no diabetic associated symptoms. Pertinent negatives for diabetes include no chest pain, no fatigue, no polydipsia, no polyphagia, no polyuria and no weakness. There are no hypoglycemic complications. Symptoms are improving. There are no diabetic complications. Risk factors for coronary artery disease include diabetes mellitus, dyslipidemia, hypertension, male sex, obesity, sedentary lifestyle and tobacco exposure. Current diabetic treatment includes insulin injections and oral agent (monotherapy). He is  compliant with treatment most of the time. His weight is decreasing steadily. He is following a generally unhealthy diet. He never participates in exercise. His breakfast blood glucose range is generally 110-130 mg/dl. His dinner blood glucose range is generally 130-140 mg/dl. His overall blood glucose range is 130-140 mg/dl. An ACE inhibitor/angiotensin II receptor blocker is being taken.  Hyperlipidemia  This is a chronic problem. The current episode started more than 1 year ago. The problem is controlled.  Exacerbating diseases include diabetes and obesity. Pertinent negatives include no chest pain, myalgias or shortness of breath. Current antihyperlipidemic treatment includes statins. Risk factors for coronary artery disease include diabetes mellitus, dyslipidemia, hypertension, male sex, obesity and a sedentary lifestyle.  Hypertension  This is a chronic problem. The current episode started more than 1 year ago. The problem is controlled. Pertinent negatives include no chest pain, headaches, neck pain, palpitations or shortness of breath. Risk factors for coronary artery disease include diabetes mellitus, dyslipidemia, smoking/tobacco exposure and obesity. Past treatments include ACE inhibitors.     Review of Systems  Constitutional: Negative for fatigue and unexpected weight change.  HENT: Negative for dental problem, mouth sores and trouble swallowing.   Eyes: Negative for visual disturbance.  Respiratory: Negative for cough, choking, chest tightness, shortness of breath and wheezing.   Cardiovascular: Negative for chest pain, palpitations and leg swelling.  Gastrointestinal: Negative for abdominal distention, abdominal pain, constipation, diarrhea, nausea and vomiting.  Endocrine: Negative for polydipsia, polyphagia and polyuria.  Genitourinary: Negative for dysuria, flank pain, hematuria and urgency.  Musculoskeletal: Negative for back pain, gait problem, myalgias and neck pain.  Skin: Negative for pallor, rash and wound.  Neurological: Negative for seizures, syncope, weakness, numbness and headaches.  Psychiatric/Behavioral: Negative.  Negative for confusion and dysphoric mood.    Objective:    Pulse 72   Ht 5\' 8"  (1.727 m)   Wt 199 lb (90.3 kg)   BMI 30.26 kg/m   Wt Readings from Last 3 Encounters:  07/03/16 199 lb (90.3 kg)  03/19/16 208 lb (94.3 kg)  12/08/15 209 lb (94.8 kg)    Physical Exam  Constitutional: He is oriented to person, place, and time. He appears  well-developed and well-nourished. He is cooperative. No distress.  HENT:  Head: Normocephalic and atraumatic.  Eyes: EOM are normal.  Neck: Normal range of motion. Neck supple. No tracheal deviation present. No thyromegaly present.  Cardiovascular: Normal rate, S1 normal, S2 normal and normal heart sounds.  Exam reveals no gallop.   No murmur heard. Pulses:      Dorsalis pedis pulses are 1+ on the right side, and 1+ on the left side.       Posterior tibial pulses are 1+ on the right side, and 1+ on the left side.  Pulmonary/Chest: Breath sounds normal. No respiratory distress. He has no wheezes.  Abdominal: Soft. Bowel sounds are normal. He exhibits no distension. There is no tenderness. There is no guarding and no CVA tenderness.  Musculoskeletal: He exhibits no edema.       Right shoulder: He exhibits no swelling and no deformity.  Neurological: He is alert and oriented to person, place, and time. He has normal strength and normal reflexes. No cranial nerve deficit or sensory deficit. Gait normal.  Skin: Skin is warm and dry. No rash noted. No cyanosis. Nails show no clubbing.  Psychiatric: He has a normal mood and affect. His speech is normal and behavior is normal. Judgment and thought content normal. Cognition and memory  are normal.    Recent Results (from the past 2160 hour(s))  COMPLETE METABOLIC PANEL WITH GFR     Status: Abnormal   Collection Time: 06/26/16  8:14 AM  Result Value Ref Range   Sodium 141 135 - 146 mmol/L   Potassium 4.4 3.5 - 5.3 mmol/L   Chloride 106 98 - 110 mmol/L   CO2 29 20 - 31 mmol/L   Glucose, Bld 114 (H) 65 - 99 mg/dL   BUN 18 7 - 25 mg/dL   Creat 1.61 0.96 - 0.45 mg/dL    Comment:   For patients > or = 73 years of age: The upper reference limit for Creatinine is approximately 13% higher for people identified as African-American.      Total Bilirubin 0.3 0.2 - 1.2 mg/dL   Alkaline Phosphatase 83 40 - 115 U/L   AST 12 10 - 35 U/L   ALT 15 9 - 46  U/L   Total Protein 5.9 (L) 6.1 - 8.1 g/dL   Albumin 3.9 3.6 - 5.1 g/dL   Calcium 8.8 8.6 - 40.9 mg/dL   GFR, Est African American >89 >=60 mL/min   GFR, Est Non African American 86 >=60 mL/min  Hemoglobin A1c     Status: Abnormal   Collection Time: 06/26/16  8:14 AM  Result Value Ref Range   Hgb A1c MFr Bld 6.1 (H) <5.7 %    Comment:   For someone without known diabetes, a hemoglobin A1c value between 5.7% and 6.4% is consistent with prediabetes and should be confirmed with a follow-up test.   For someone with known diabetes, a value <7% indicates that their diabetes is well controlled. A1c targets should be individualized based on duration of diabetes, age, co-morbid conditions and other considerations.   This assay result is consistent with an increased risk of diabetes.   Currently, no consensus exists regarding use of hemoglobin A1c for diagnosis of diabetes in children.      Mean Plasma Glucose 128 mg/dL     Assessment & Plan:   1. Uncontrolled type 2 diabetes mellitus without complication, with long-term current use of insulin (HCC)  - patient remains at a high risk for more acute and chronic complications of diabetes which include CAD, CVA, CKD, retinopathy, and neuropathy. These are all discussed in detail with the patient.  Patient came with improved  glucose profile, and  recent A1c of 6.1 %.  Glucose logs and insulin administration records pertaining to this visit,  to be scanned into patient's records.  Recent labs reviewed.   - I have re-counseled the patient on diet management and weight loss  by adopting a carbohydrate restricted / protein rich  Diet.  - Suggestion is made for patient to avoid simple carbohydrates   from their diet including Cakes , Desserts, Ice Cream,  Soda (  diet and regular) , Sweet Tea , Candies,  Chips, Cookies, Artificial Sweeteners,   and "Sugar-free" Products .  This will help patient to have stable blood glucose profile and  potentially avoid unintended  Weight gain.  - Patient is advised to stick to a routine mealtimes to eat 3 meals  a day and avoid unnecessary snacks ( to snack only to correct hypoglycemia).  - I have approached patient with the following individualized plan to manage diabetes and patient agrees.  - decrease Levemir to 20 units qam. He will monitor BG BID. His care taker is encouraged to call clinic if he has blood glucose levels  less than 70 or above 300 mg /dl.  -I will continue metformin 1000mg  po BID.    - Patient specific target  for A1c; LDL, HDL, Triglycerides, and  Waist Circumference were discussed in detail.  2) BP/HTN:   Controlled . Continue current medications including ACEI/ARB. 3) Lipids/HPL:  continue statins. 4)  Weight/Diet: exercise, and carbohydrates information provided.  5) Chronic Care/Health Maintenance:  -Patient is  on ACEI/ARB and Statin medications and encouraged to continue to follow up with Ophthalmology, Podiatrist at least yearly or according to recommendations, and advised to quit  smoking. I have recommended yearly flu vaccine and pneumonia vaccination at least every 5 years; moderate intensity exercise for up to 150 minutes weekly; and  sleep for at least 7 hours a day.   - I advised patient to maintain close follow up with HAWKINS,EDWARD L, MD for primary care needs.  Patient is asked to bring meter and  blood glucose logs during their next visit.   Follow up plan: -Return in about 3 months (around 10/03/2016) for follow up with pre-visit labs, meter, and logs.  Marquis Lunch, MD Phone: [phone]  Fax: [phone]   07/03/2016, 3:08 PM

## 2016-07-29 ENCOUNTER — Emergency Department (HOSPITAL_COMMUNITY): Payer: Medicare Other

## 2016-07-29 ENCOUNTER — Emergency Department (HOSPITAL_COMMUNITY)
Admission: EM | Admit: 2016-07-29 | Discharge: 2016-07-29 | Disposition: A | Discharge status: Home / Self Care | Payer: Medicare Other | Attending: Emergency Medicine | Admitting: Emergency Medicine

## 2016-07-29 ENCOUNTER — Encounter (HOSPITAL_COMMUNITY): Payer: Self-pay | Admitting: Emergency Medicine

## 2016-07-29 DIAGNOSIS — Z79899 Other long term (current) drug therapy: Secondary | ICD-10-CM | POA: Diagnosis not present

## 2016-07-29 DIAGNOSIS — Z794 Long term (current) use of insulin: Secondary | ICD-10-CM | POA: Diagnosis not present

## 2016-07-29 DIAGNOSIS — R600 Localized edema: Secondary | ICD-10-CM | POA: Diagnosis not present

## 2016-07-29 DIAGNOSIS — M7989 Other specified soft tissue disorders: Secondary | ICD-10-CM

## 2016-07-29 DIAGNOSIS — F250 Schizoaffective disorder, bipolar type: Secondary | ICD-10-CM | POA: Diagnosis not present

## 2016-07-29 DIAGNOSIS — G309 Alzheimer's disease, unspecified: Secondary | ICD-10-CM | POA: Insufficient documentation

## 2016-07-29 DIAGNOSIS — F17200 Nicotine dependence, unspecified, uncomplicated: Secondary | ICD-10-CM | POA: Insufficient documentation

## 2016-07-29 DIAGNOSIS — I10 Essential (primary) hypertension: Secondary | ICD-10-CM | POA: Insufficient documentation

## 2016-07-29 DIAGNOSIS — E119 Type 2 diabetes mellitus without complications: Secondary | ICD-10-CM | POA: Diagnosis not present

## 2016-07-29 NOTE — ED Provider Notes (Signed)
[facility]-EMERGENCY DEPT Provider Note   CSN: [id number] Arrival date & time: 07/29/16  1150     History   Chief Complaint Chief Complaint  Patient presents with  . Leg edema  . Hypotension    HPI Andre Rowe is a 75 y.o. male.  Level V caveat for dementia and schizophrenia. Patient was escorted by the supervisor of the group home where he resides. She states that patient has been having trouble ambulating recently. She is concerned about swelling in the left leg. Patient denies any pain in the hips, arms, legs. No fever, sweats, chills.      Past Medical History:  Diagnosis Date  . Alzheimer disease   . Dementia   . Diabetes mellitus without complication (HCC)   . GERD (gastroesophageal reflux disease)   . High cholesterol   . Hypertension   . Overactive bladder   . Schizophrenia [facility](HCC)     Patient Active Problem List   Diagnosis Date Noted  . Vitamin D deficiency 12/08/2015  . Hyperlipidemia 08/02/2015  . Cellulitis of leg, left 12/11/2014  . Type 2 diabetes mellitus, uncontrolled (HCC) 12/08/2014  . Dementia 12/08/2014  . Schizophrenia (HCC) 12/08/2014  . Hypertension 12/08/2014  . HCAP (healthcare-associated pneumonia) 12/08/2014    Past Surgical History:  Procedure Laterality Date  . unable         Home Medications    Prior to Admission medications   Medication Sig Start Date End Date Taking? Authorizing Provider  bimatoprost (LUMIGAN) 0.01 % SOLN Place 1 drop into both eyes at bedtime.   Yes Historical Provider, MD  carbamazepine (TEGRETOL) 200 MG tablet Take 200 mg by mouth 3 (three) times daily.   Yes Historical Provider, MD  clonazePAM (KLONOPIN) 0.5 MG tablet Take 0.5 mg by mouth 2 (two) times daily.    Yes Historical Provider, MD  donepezil (ARICEPT) 10 MG tablet Take 10 mg by mouth at bedtime.   Yes Historical Provider, MD  folic acid (FOLVITE) 1 MG tablet Take 1 mg by mouth daily.   Yes Historical Provider, MD  insulin detemir (LEVEMIR)  100 UNIT/ML injection Inject 0.2 mLs (20 Units total) into the skin every morning. 07/03/16  Yes Roma KayserGebreselassie W [name], MD  lisinopril (PRINIVIL,ZESTRIL) 10 MG tablet Take 10 mg by mouth daily.   Yes Historical Provider, MD  memantine (NAMENDA) 10 MG tablet Take 10 mg by mouth 2 (two) times daily.   Yes Historical Provider, MD  metFORMIN (GLUCOPHAGE) 1000 MG tablet Take 1 tablet (1,000 mg total) by mouth 2 (two) times daily with a meal. 02/22/16  Yes Roma KayserGebreselassie W [name], MD  OLANZapine (ZYPREXA) 10 MG tablet Take 10 mg by mouth at bedtime.   Yes Historical Provider, MD  omeprazole (PRILOSEC) 20 MG capsule Take 20 mg by mouth daily.   Yes Historical Provider, MD  oxybutynin (DITROPAN) 5 MG tablet Take 5 mg by mouth 2 (two) times daily.   Yes Historical Provider, MD  senna (SENOKOT) 8.6 MG TABS tablet Take 1 tablet by mouth at bedtime.   Yes Historical Provider, MD  simvastatin (ZOCOR) 40 MG tablet Take 40 mg by mouth every evening.   Yes Historical Provider, MD  tamsulosin (FLOMAX) 0.4 MG CAPS Take 0.4 mg by mouth daily.   Yes Historical Provider, MD  Vitamin D, Ergocalciferol, (DRISDOL) 50000 units CAPS capsule Take 1 capsule (50,000 Units total) by mouth every 7 (seven) days. 02/22/16  Yes Roma KayserGebreselassie W [name], MD    Family History Family History  Problem Relation  Age of Onset  . Family history unknown: Yes    Social History Social History  Substance Use Topics  . Smoking status: Current Every Day Smoker  . Smokeless tobacco: Never Used  . Alcohol use No     Allergies   Patient has no known allergies.   Review of Systems Review of Systems  Reason unable to perform ROS: Dementia.     Physical Exam Updated Vital Signs BP 141/88   Pulse 89   Temp 98.3 F (36.8 C) (Oral)   Resp 18   Ht 5\' 6"  (1.676 m)   Wt 199 lb (90.3 kg)   SpO2 96%   BMI 32.12 kg/m   Physical Exam  Constitutional:  Demented, no acute distress  HENT:  Head: Normocephalic and atraumatic.  Eyes:  Conjunctivae are normal.  Neck: Neck supple.  Cardiovascular: Normal rate and regular rhythm.   Pulmonary/Chest: Effort normal and breath sounds normal.  Abdominal: Soft. Bowel sounds are normal.  Musculoskeletal: Normal range of motion.  No obvious bony tenderness of her arms, hips, legs  Neurological:  Appears to be moving all extremities  Skin: Skin is warm and dry.  Small scab on mid left anterior tibial area with surrounding erythema  Psychiatric:  Demented, flat affect  Nursing note and vitals reviewed.    ED Treatments / Results  Labs (all labs ordered are listed, but only abnormal results are displayed) Labs Reviewed - No data to display  EKG  EKG Interpretation None       Radiology Dg Pelvis 1-2 Views  Result Date: 07/29/2016 CLINICAL DATA:  Acute onset of difficulty ambulating. Left leg swelling. EXAM: PELVIS - 1-2 VIEW COMPARISON:  CT scan of the abdomen dated 12/08/2014 FINDINGS: There is no evidence of pelvic fracture or diastasis. No pelvic bone lesions are seen. IMPRESSION: Negative. Electronically Signed   By: Francene BoyersJames  Maxwell M.D.   On: 07/29/2016 13:57   [place] Venous Img Lower Unilateral Left  Result Date: 07/29/2016 CLINICAL DATA:  Left lower extremity pain, edema and erythema. EXAM: LEFT LOWER EXTREMITY VENOUS DOPPLER ULTRASOUND TECHNIQUE: Gray-scale sonography with graded compression, as well as color Doppler and duplex ultrasound were performed to evaluate the lower extremity deep venous systems from the level of the common femoral vein and including the common femoral, femoral, profunda femoral, popliteal and calf veins including the posterior tibial, peroneal and gastrocnemius veins when visible. The superficial great saphenous vein was also interrogated. Spectral Doppler was utilized to evaluate flow at rest and with distal augmentation maneuvers in the common femoral, femoral and popliteal veins. COMPARISON:  12/25/2013 FINDINGS: Contralateral Common Femoral  Vein: Respiratory phasicity is normal and symmetric with the symptomatic side. No evidence of thrombus. Normal compressibility. Common Femoral Vein: No evidence of thrombus. Normal compressibility, respiratory phasicity and response to augmentation. Saphenofemoral Junction: No evidence of thrombus. Normal compressibility and flow on color Doppler imaging. Profunda Femoral Vein: No evidence of thrombus. Normal compressibility and flow on color Doppler imaging. Femoral Vein: No evidence of thrombus. Normal compressibility, respiratory phasicity and response to augmentation. Popliteal Vein: No evidence of thrombus. Normal compressibility, respiratory phasicity and response to augmentation. Calf Veins: No evidence of thrombus. Normal compressibility and flow on color Doppler imaging. Superficial Great Saphenous Vein: No evidence of thrombus. Normal compressibility and flow on color Doppler imaging. Venous Reflux:  None. Other Findings: No evidence of superficial thrombophlebitis or abnormal fluid collection. IMPRESSION: No evidence of left lower extremity deep venous thrombosis. Electronically Signed   By: Sherrine MaplesGlenn  Fredia Sorrow M.D.   On: 07/29/2016 13:56    Procedures Procedures (including critical care time)  Medications Ordered in ED Medications - No data to display   Initial Impression / Assessment and Plan / ED Course  I have reviewed the triage vital signs and the nursing notes.  Pertinent labs & imaging results that were available during my care of the patient were reviewed by me and considered in my medical decision making (see chart for details).  Clinical Course     Patient appears in no acute distress. Plain films of pelvis and Doppler study of left lower extremity show no acute findings. Discussed with the supervisor of the group home. She will return for any worsening concerns  Final Clinical Impressions(s) / ED Diagnoses   Final diagnoses:  Swelling of left lower extremity    New  Prescriptions New Prescriptions   No medications on file     Donnetta Hutching, MD 07/29/16 [phone]

## 2016-07-29 NOTE — ED Notes (Signed)
Pt's sweatshirt and jacket removed, medium sized cuff placed and higher BP reading registered at this time. Pt denies pain to L leg, pt does have scabbing noted to leg and edema. Caretaker states abnormal gait today caused concern.

## 2016-07-29 NOTE — Discharge Instructions (Signed)
X-ray of pelvis and ultrasound of left leg were both negative for any treatable conditions. Return if worse.

## 2016-07-29 NOTE — ED Triage Notes (Signed)
Caregiver reports she noticed pt was limping on his L leg. Upon assessment caregiver noticed an abrasion on the front of his LL leg and edema and redness to his L calf. Pt has hx of Alzheimer's and dementia. Pt unable to communicate what occurred.

## 2016-08-21 ENCOUNTER — Other Ambulatory Visit: Payer: Self-pay | Admitting: "Endocrinology

## 2016-08-21 DIAGNOSIS — E119 Type 2 diabetes mellitus without complications: Secondary | ICD-10-CM | POA: Diagnosis not present

## 2016-08-21 DIAGNOSIS — J449 Chronic obstructive pulmonary disease, unspecified: Secondary | ICD-10-CM | POA: Diagnosis not present

## 2016-08-21 DIAGNOSIS — F209 Schizophrenia, unspecified: Secondary | ICD-10-CM | POA: Diagnosis not present

## 2016-08-21 DIAGNOSIS — I10 Essential (primary) hypertension: Secondary | ICD-10-CM | POA: Diagnosis not present

## 2016-08-27 DIAGNOSIS — L851 Acquired keratosis [keratoderma] palmaris et plantaris: Secondary | ICD-10-CM | POA: Diagnosis not present

## 2016-08-27 DIAGNOSIS — E1342 Other specified diabetes mellitus with diabetic polyneuropathy: Secondary | ICD-10-CM | POA: Diagnosis not present

## 2016-08-27 DIAGNOSIS — B351 Tinea unguium: Secondary | ICD-10-CM | POA: Diagnosis not present

## 2016-08-28 DIAGNOSIS — E119 Type 2 diabetes mellitus without complications: Secondary | ICD-10-CM | POA: Diagnosis not present

## 2016-08-28 DIAGNOSIS — H401132 Primary open-angle glaucoma, bilateral, moderate stage: Secondary | ICD-10-CM | POA: Diagnosis not present

## 2016-08-28 DIAGNOSIS — H2513 Age-related nuclear cataract, bilateral: Secondary | ICD-10-CM | POA: Diagnosis not present

## 2016-09-22 IMAGING — CR DG CHEST 2V
2 series · 2 of 2 positions shown · non-contrast
Comparison: 10/16/2008

CLINICAL DATA: Chest congestion.  Chronic bronchitis.

EXAM:
CHEST  2 VIEW

[view not recorded (1 of 2)]
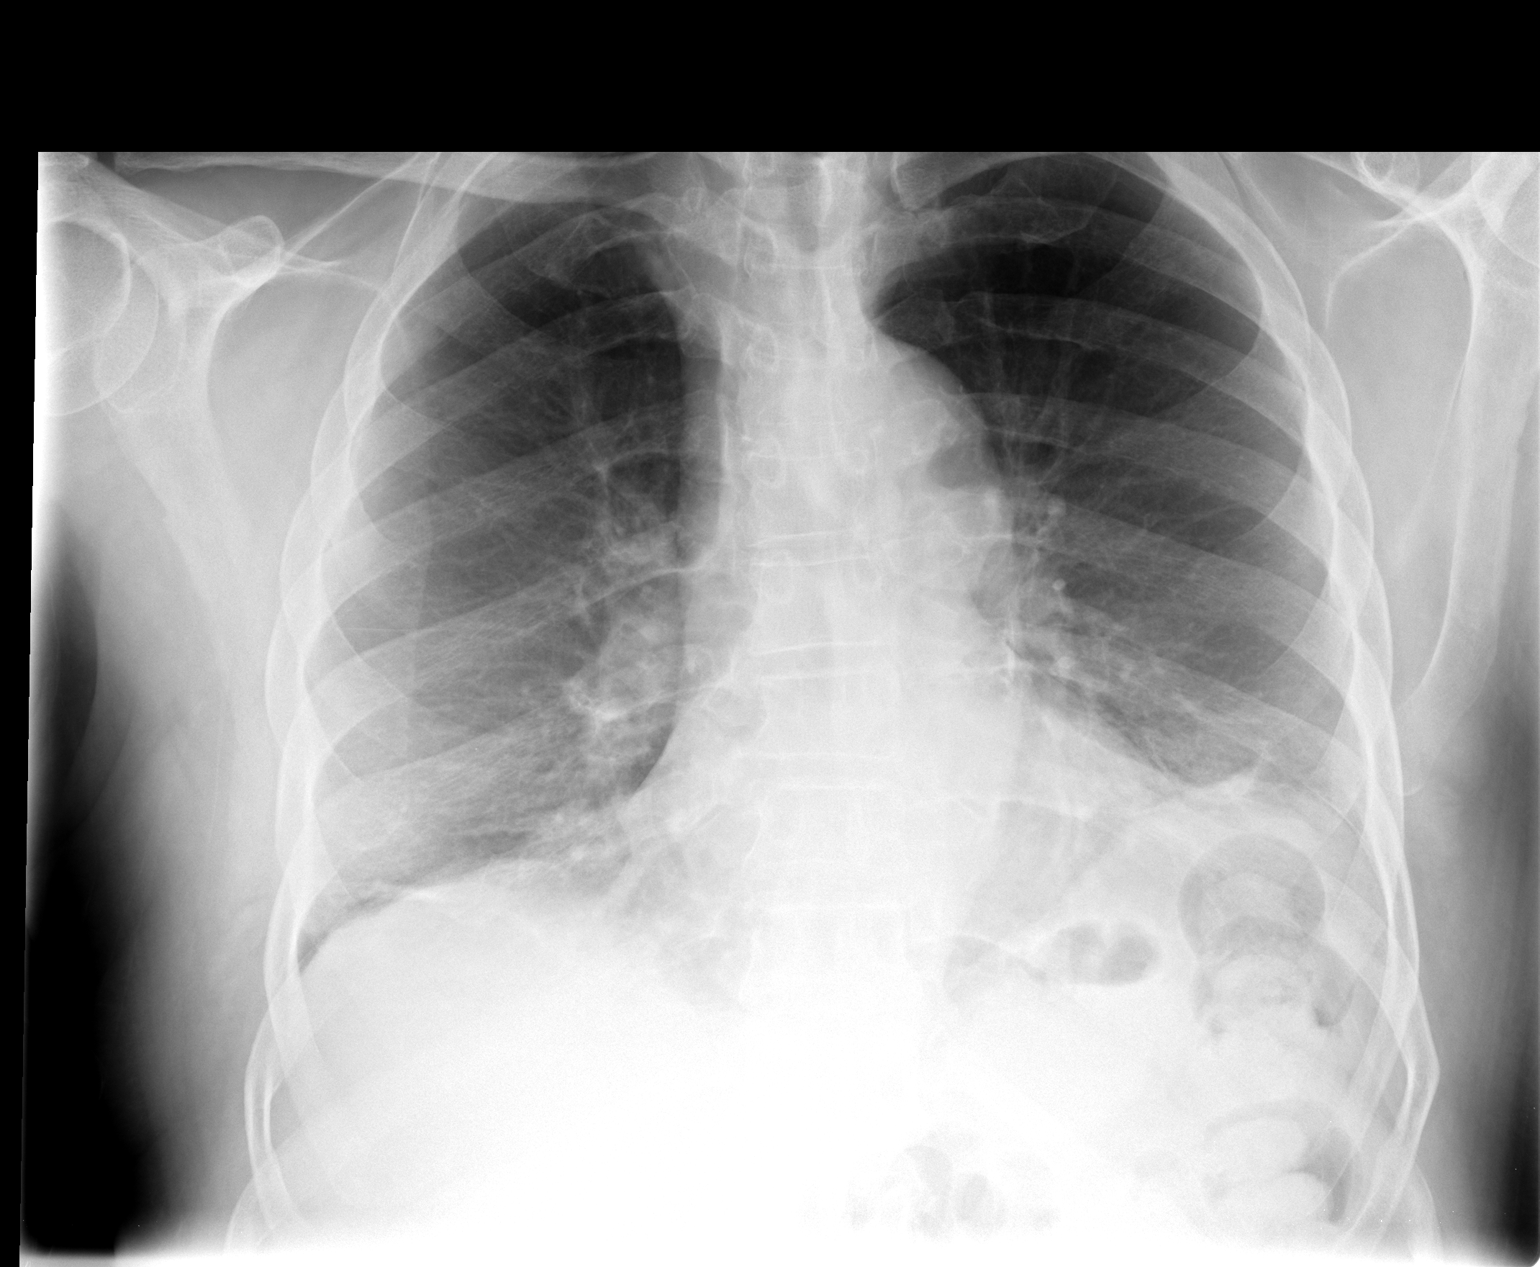

[view not recorded (2 of 2)]
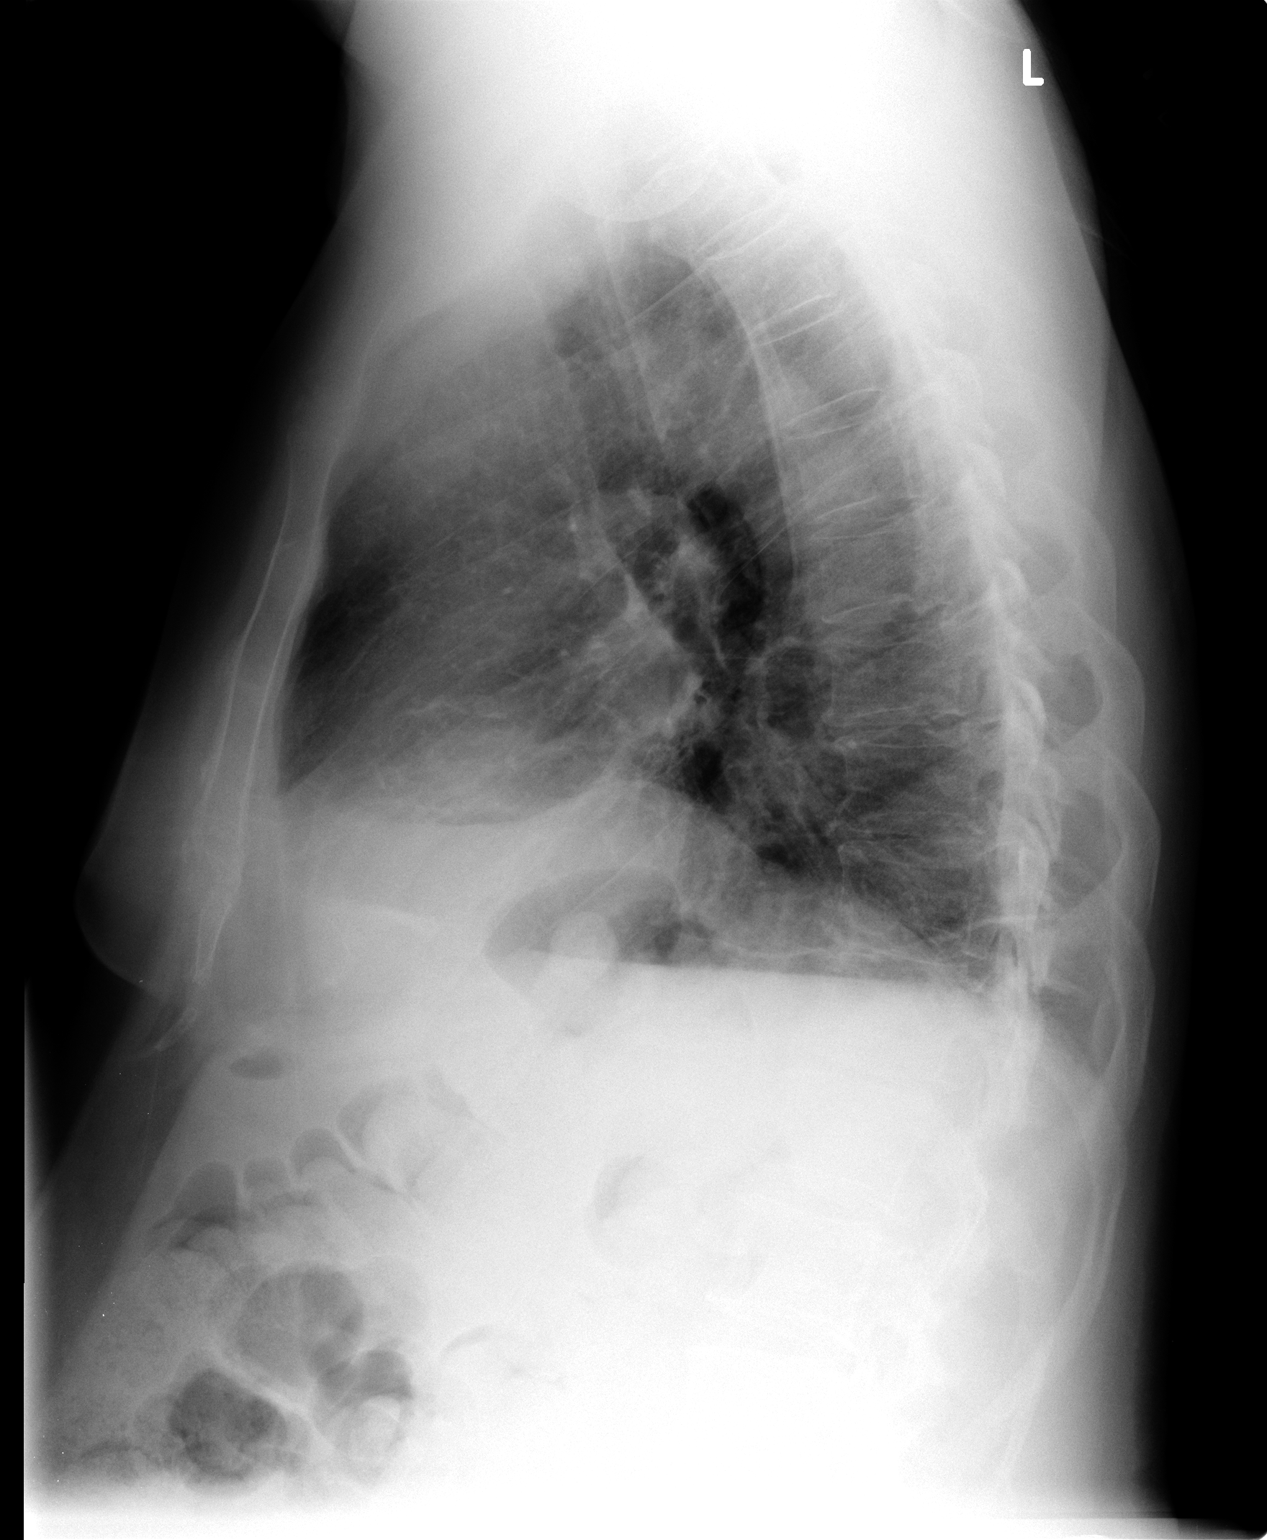

[2 of 2 positions shown; findings below may reference images not displayed]

FINDINGS: Heart size and pulmonary vascularity are normal. There is tortuosity
and calcification of thoracic aorta.

There is new slight linear atelectasis at the right lung base. This
also slight atelectasis as well as a small area of scarring at the
left lung base. No acute osseous abnormality. No pleural effusions.

Lucency in the upper lobes consistent with emphysema.
IMPRESSION: Slight bibasilar atelectasis.  Emphysema.

## 2016-10-09 ENCOUNTER — Ambulatory Visit: Payer: Medicare Other | Admitting: "Endocrinology

## 2016-10-11 ENCOUNTER — Other Ambulatory Visit: Payer: Self-pay | Admitting: "Endocrinology

## 2016-10-11 DIAGNOSIS — E782 Mixed hyperlipidemia: Secondary | ICD-10-CM | POA: Diagnosis not present

## 2016-10-11 DIAGNOSIS — E1165 Type 2 diabetes mellitus with hyperglycemia: Secondary | ICD-10-CM | POA: Diagnosis not present

## 2016-10-11 DIAGNOSIS — Z794 Long term (current) use of insulin: Secondary | ICD-10-CM | POA: Diagnosis not present

## 2016-10-11 LAB — COMPREHENSIVE METABOLIC PANEL
ALT: 12 U/L (ref 9–46)
AST: 11 U/L (ref 10–35)
Albumin: 3.7 g/dL (ref 3.6–5.1)
Alkaline Phosphatase: 91 U/L (ref 40–115)
BILIRUBIN TOTAL: 0.3 mg/dL (ref 0.2–1.2)
BUN: 15 mg/dL (ref 7–25)
CALCIUM: 8.8 mg/dL (ref 8.6–10.3)
CO2: 28 mmol/L (ref 20–31)
Chloride: 105 mmol/L (ref 98–110)
Creat: 0.88 mg/dL (ref 0.70–1.18)
Glucose, Bld: 125 mg/dL — ABNORMAL HIGH (ref 65–99)
POTASSIUM: 4.6 mmol/L (ref 3.5–5.3)
Sodium: 142 mmol/L (ref 135–146)
Total Protein: 5.9 g/dL — ABNORMAL LOW (ref 6.1–8.1)

## 2016-10-11 LAB — LIPID PANEL
CHOL/HDL RATIO: 2.9 ratio (ref <5.0)
Cholesterol: 143 mg/dL (ref <200)
HDL: 50 mg/dL (ref >40)
LDL Cholesterol: 75 mg/dL (ref <100)
TRIGLYCERIDES: 91 mg/dL (ref <150)
VLDL: 18 mg/dL (ref <30)

## 2016-10-11 LAB — T4, FREE: Free T4: 0.9 ng/dL (ref 0.8–1.8)

## 2016-10-11 LAB — HEMOGLOBIN A1C
HEMOGLOBIN A1C: 6.5 % — AB (ref <5.7)
MEAN PLASMA GLUCOSE: 140 mg/dL

## 2016-10-11 LAB — TSH: TSH: 0.95 mIU/L (ref 0.40–4.50)

## 2016-10-22 ENCOUNTER — Ambulatory Visit (INDEPENDENT_AMBULATORY_CARE_PROVIDER_SITE_OTHER): Payer: Medicare Other | Admitting: "Endocrinology

## 2016-10-22 ENCOUNTER — Encounter: Payer: Self-pay | Admitting: "Endocrinology

## 2016-10-22 VITALS — BP 143/81 | HR 88 | Ht 68.0 in | Wt 205.0 lb

## 2016-10-22 DIAGNOSIS — I10 Essential (primary) hypertension: Secondary | ICD-10-CM | POA: Diagnosis not present

## 2016-10-22 DIAGNOSIS — E1165 Type 2 diabetes mellitus with hyperglycemia: Secondary | ICD-10-CM

## 2016-10-22 DIAGNOSIS — Z794 Long term (current) use of insulin: Secondary | ICD-10-CM

## 2016-10-22 DIAGNOSIS — E782 Mixed hyperlipidemia: Secondary | ICD-10-CM | POA: Diagnosis not present

## 2016-10-22 DIAGNOSIS — IMO0001 Reserved for inherently not codable concepts without codable children: Secondary | ICD-10-CM

## 2016-10-22 NOTE — Progress Notes (Signed)
Subjective:    Patient ID: Andre Rowe, male    DOB: 11/11/1940, PCP Fredirick Maudlin, MD   Past Medical History:  Diagnosis Date  . Alzheimer disease   . Dementia   . Diabetes mellitus without complication (HCC)   . GERD (gastroesophageal reflux disease)   . High cholesterol   . Hypertension   . Overactive bladder   . Schizophrenia [facility])    Past Surgical History:  Procedure Laterality Date  . unable     Social History   Social History  . Marital status: Single    Spouse name: N/A  . Number of children: N/A  . Years of education: N/A   Social History Main Topics  . Smoking status: Current Every Day Smoker  . Smokeless tobacco: Never Used  . Alcohol use No  . Drug use: No  . Sexual activity: Not Asked   Other Topics Concern  . None   Social History Narrative  . None   Outpatient Encounter Prescriptions as of 10/22/2016  Medication Sig  . bimatoprost (LUMIGAN) 0.01 % SOLN Place 1 drop into both eyes at bedtime.  . carbamazepine (TEGRETOL) 200 MG tablet Take 200 mg by mouth 3 (three) times daily.  . clonazePAM (KLONOPIN) 0.5 MG tablet Take 0.5 mg by mouth 2 (two) times daily.   Marland Kitchen donepezil (ARICEPT) 10 MG tablet Take 10 mg by mouth at bedtime.  . folic acid (FOLVITE) 1 MG tablet Take 1 mg by mouth daily.  . insulin detemir (LEVEMIR) 100 UNIT/ML injection Inject 0.2 mLs (20 Units total) into the skin every morning.  Marland Kitchen lisinopril (PRINIVIL,ZESTRIL) 10 MG tablet Take 10 mg by mouth daily.  . memantine (NAMENDA) 10 MG tablet Take 10 mg by mouth 2 (two) times daily.  . metFORMIN (GLUCOPHAGE) 1000 MG tablet Take 1 tablet (1,000 mg total) by mouth 2 (two) times daily with a meal.  . OLANZapine (ZYPREXA) 10 MG tablet Take 10 mg by mouth at bedtime.  Marland Kitchen omeprazole (PRILOSEC) 20 MG capsule Take 20 mg by mouth daily.  Marland Kitchen oxybutynin (DITROPAN) 5 MG tablet Take 5 mg by mouth 2 (two) times daily.  Marland Kitchen senna (SENOKOT) 8.6 MG TABS tablet Take 1 tablet by mouth at bedtime.  .  simvastatin (ZOCOR) 40 MG tablet Take 40 mg by mouth every evening.  . tamsulosin (FLOMAX) 0.4 MG CAPS Take 0.4 mg by mouth daily.  . Vitamin D, Ergocalciferol, (DRISDOL) 50000 units CAPS capsule Take 1 capsule (50,000 Units total) by mouth every 7 (seven) days.   No facility-administered encounter medications on file as of 10/22/2016.    ALLERGIES: No Known Allergies VACCINATION STATUS:  There is no immunization history on file for this patient.  Diabetes  He presents for his follow-up diabetic visit. He has type 2 diabetes mellitus. Onset time: he was diagnosed at age 57 yrs. His disease course has been improving. There are no hypoglycemic associated symptoms. Pertinent negatives for hypoglycemia include no confusion, headaches, pallor or seizures. There are no diabetic associated symptoms. Pertinent negatives for diabetes include no chest pain, no fatigue, no polydipsia, no polyphagia, no polyuria and no weakness. There are no hypoglycemic complications. Symptoms are improving. There are no diabetic complications. Risk factors for coronary artery disease include diabetes mellitus, dyslipidemia, hypertension, male sex, obesity, sedentary lifestyle and tobacco exposure. Current diabetic treatment includes insulin injections and oral agent (monotherapy). He is compliant with treatment most of the time. His weight is decreasing steadily. He is following a generally unhealthy diet.  He never participates in exercise. His breakfast blood glucose range is generally 110-130 mg/dl. His dinner blood glucose range is generally 130-140 mg/dl. His overall blood glucose range is 130-140 mg/dl. An ACE inhibitor/angiotensin II receptor blocker is being taken.  Hyperlipidemia  This is a chronic problem. The current episode started more than 1 year ago. The problem is controlled. Exacerbating diseases include diabetes and obesity. Pertinent negatives include no chest pain, myalgias or shortness of breath. Current  antihyperlipidemic treatment includes statins. Risk factors for coronary artery disease include diabetes mellitus, dyslipidemia, hypertension, male sex, obesity and a sedentary lifestyle.  Hypertension  This is a chronic problem. The current episode started more than 1 year ago. The problem is controlled. Pertinent negatives include no chest pain, headaches, neck pain, palpitations or shortness of breath. Risk factors for coronary artery disease include diabetes mellitus, dyslipidemia, smoking/tobacco exposure and obesity. Past treatments include ACE inhibitors.     Review of Systems  Constitutional: Negative for fatigue and unexpected weight change.  HENT: Negative for dental problem, mouth sores and trouble swallowing.   Eyes: Negative for visual disturbance.  Respiratory: Negative for cough, choking, chest tightness, shortness of breath and wheezing.   Cardiovascular: Negative for chest pain, palpitations and leg swelling.  Gastrointestinal: Positive for constipation. Negative for abdominal distention, abdominal pain, diarrhea, nausea and vomiting.  Endocrine: Negative for polydipsia, polyphagia and polyuria.  Genitourinary: Negative for dysuria, flank pain, hematuria and urgency.  Musculoskeletal: Negative for back pain, gait problem, myalgias and neck pain.  Skin: Negative for pallor, rash and wound.  Neurological: Negative for seizures, syncope, weakness, numbness and headaches.  Psychiatric/Behavioral: Negative.  Negative for confusion and dysphoric mood.    Objective:    BP (!) 143/81   Pulse 88   Ht 5\' 8"  (1.727 m)   Wt 205 lb (93 kg)   BMI 31.17 kg/m   Wt Readings from Last 3 Encounters:  10/22/16 205 lb (93 kg)  07/29/16 199 lb (90.3 kg)  07/03/16 199 lb (90.3 kg)    Physical Exam  Constitutional: He is oriented to person, place, and time. He appears well-developed and well-nourished. He is cooperative. No distress.  HENT:  Head: Normocephalic and atraumatic.  Eyes:  EOM are normal.  Neck: Normal range of motion. Neck supple. No tracheal deviation present. No thyromegaly present.  Cardiovascular: Normal rate, S1 normal, S2 normal and normal heart sounds.  Exam reveals no gallop.   No murmur heard. Pulses:      Dorsalis pedis pulses are 1+ on the right side, and 1+ on the left side.       Posterior tibial pulses are 1+ on the right side, and 1+ on the left side.  Pulmonary/Chest: Breath sounds normal. No respiratory distress. He has no wheezes.  Abdominal: Soft. Bowel sounds are normal. He exhibits no distension. There is no tenderness. There is no guarding and no CVA tenderness.  Musculoskeletal: He exhibits no edema.       Right shoulder: He exhibits no swelling and no deformity.  Neurological: He is alert and oriented to person, place, and time. He has normal strength and normal reflexes. No cranial nerve deficit or sensory deficit. Gait normal.  Skin: Skin is warm and dry. No rash noted. No cyanosis. Nails show no clubbing.  Psychiatric: He has a normal mood and affect. His speech is normal and behavior is normal. Judgment and thought content normal. Cognition and memory are normal.    Recent Results (from the past 2160 hour(s))  Comprehensive metabolic panel     Status: Abnormal   Collection Time: 10/11/16  8:58 AM  Result Value Ref Range   Sodium 142 135 - 146 mmol/L   Potassium 4.6 3.5 - 5.3 mmol/L   Chloride 105 98 - 110 mmol/L   CO2 28 20 - 31 mmol/L   Glucose, Bld 125 (H) 65 - 99 mg/dL   BUN 15 7 - 25 mg/dL   Creat 5.62 1.30 - 8.65 mg/dL    Comment:   For patients > or = 74 years of age: The upper reference limit for Creatinine is approximately 13% higher for people identified as African-American.      Total Bilirubin 0.3 0.2 - 1.2 mg/dL   Alkaline Phosphatase 91 40 - 115 U/L   AST 11 10 - 35 U/L   ALT 12 9 - 46 U/L   Total Protein 5.9 (L) 6.1 - 8.1 g/dL   Albumin 3.7 3.6 - 5.1 g/dL   Calcium 8.8 8.6 - 78.4 mg/dL  Lipid panel      Status: None   Collection Time: 10/11/16  8:58 AM  Result Value Ref Range   Cholesterol 143 <200 mg/dL   Triglycerides 91 <696 mg/dL   HDL 50 >29 mg/dL   Total CHOL/HDL Ratio 2.9 <5.0 Ratio   VLDL 18 <30 mg/dL   LDL Cholesterol 75 <528 mg/dL  TSH     Status: None   Collection Time: 10/11/16  8:58 AM  Result Value Ref Range   TSH 0.95 0.40 - 4.50 mIU/L  T4, free     Status: None   Collection Time: 10/11/16  8:58 AM  Result Value Ref Range   Free T4 0.9 0.8 - 1.8 ng/dL  Hemoglobin U1L     Status: Abnormal   Collection Time: 10/11/16  8:58 AM  Result Value Ref Range   Hgb A1c MFr Bld 6.5 (H) <5.7 %    Comment:   For someone without known diabetes, a hemoglobin A1c value of 6.5% or greater indicates that they may have diabetes and this should be confirmed with a follow-up test.   For someone with known diabetes, a value <7% indicates that their diabetes is well controlled and a value greater than or equal to 7% indicates suboptimal control. A1c targets should be individualized based on duration of diabetes, age, comorbid conditions, and other considerations.   Currently, no consensus exists for use of hemoglobin A1c for diagnosis of diabetes for children.      Mean Plasma Glucose 140 mg/dL     Assessment & Plan:   1. Uncontrolled type 2 diabetes mellitus without complication, with long-term current use of insulin (HCC)  - patient remains at a high risk for more acute and chronic complications of diabetes which include CAD, CVA, CKD, retinopathy, and neuropathy. These are all discussed in detail with the patient.  Patient came with improved  glucose profile, and  recent A1c of 6.5 %.  Glucose logs and insulin administration records pertaining to this visit,  to be scanned into patient's records.  Recent labs reviewed.   - I have re-counseled the patient on diet management and weight loss  by adopting a carbohydrate restricted / protein rich  Diet.  - Suggestion is made  for patient to avoid simple carbohydrates   from their diet including Cakes , Desserts, Ice Cream,  Soda (  diet and regular) , Sweet Tea , Candies,  Chips, Cookies, Artificial Sweeteners,   and "Sugar-free" Products .  This  will help patient to have stable blood glucose profile and potentially avoid unintended  Weight gain.  - Patient is advised to stick to a routine mealtimes to eat 3 meals  a day and avoid unnecessary snacks ( to snack only to correct hypoglycemia).  - I have approached patient with the following individualized plan to manage diabetes and patient agrees.  - Continue Levemir  20 units qam with breakfast. He will monitor BG BID. His care taker is encouraged to call clinic if he has blood glucose levels less than 70 or above 300 mg /dl.  -I will continue metformin 1000mg  po BID.    - Patient specific target  for A1c; LDL, HDL, Triglycerides, and  Waist Circumference were discussed in detail.  2) BP/HTN:   Controlled . Continue current medications including ACEI/ARB. 3) Lipids/HPL:  continue statins. 4)  Weight/Diet: exercise, and carbohydrates information provided.  5) Chronic Care/Health Maintenance:  -Patient is  on ACEI/ARB and Statin medications and encouraged to continue to follow up with Ophthalmology, Podiatrist at least yearly or according to recommendations, and advised to quit  smoking. I have recommended yearly flu vaccine and pneumonia vaccination at least every 5 years; moderate intensity exercise for up to 150 minutes weekly; and  sleep for at least 7 hours a day. - I advised his caretaker to schedule a visit with Dr. Juanetta Gosling for possible referral to GI for colonoscopy given his complaint of constipation.   - I advised patient to maintain close follow up with HAWKINS,EDWARD L, MD for primary care needs.  Patient is asked to bring meter and  blood glucose logs during their next visit.   Follow up plan: -Return in about 3 months (around 01/22/2017) for follow  up with pre-visit labs, meter, and logs.  Marquis Lunch, MD Phone: [phone]  Fax: [phone]   10/22/2016, 10:20 AM

## 2016-10-22 NOTE — Patient Instructions (Signed)

## 2016-10-25 ENCOUNTER — Emergency Department (HOSPITAL_COMMUNITY): Payer: Medicare Other

## 2016-10-25 ENCOUNTER — Inpatient Hospital Stay (HOSPITAL_COMMUNITY)
Admission: EM | Admit: 2016-10-25 | Discharge: 2016-10-28 | DRG: 087 | Disposition: A | Discharge status: Home / Self Care | Payer: Medicare Other | Attending: Pulmonary Disease | Admitting: Pulmonary Disease

## 2016-10-25 ENCOUNTER — Encounter (HOSPITAL_COMMUNITY): Payer: Self-pay | Admitting: Cardiology

## 2016-10-25 DIAGNOSIS — Z794 Long term (current) use of insulin: Secondary | ICD-10-CM

## 2016-10-25 DIAGNOSIS — Y92099 Unspecified place in other non-institutional residence as the place of occurrence of the external cause: Secondary | ICD-10-CM

## 2016-10-25 DIAGNOSIS — Z8679 Personal history of other diseases of the circulatory system: Secondary | ICD-10-CM | POA: Diagnosis present

## 2016-10-25 DIAGNOSIS — S199XXA Unspecified injury of neck, initial encounter: Secondary | ICD-10-CM | POA: Diagnosis not present

## 2016-10-25 DIAGNOSIS — F209 Schizophrenia, unspecified: Secondary | ICD-10-CM | POA: Diagnosis not present

## 2016-10-25 DIAGNOSIS — F0280 Dementia in other diseases classified elsewhere without behavioral disturbance: Secondary | ICD-10-CM | POA: Diagnosis present

## 2016-10-25 DIAGNOSIS — F0390 Unspecified dementia without behavioral disturbance: Secondary | ICD-10-CM | POA: Diagnosis not present

## 2016-10-25 DIAGNOSIS — E1165 Type 2 diabetes mellitus with hyperglycemia: Secondary | ICD-10-CM | POA: Diagnosis not present

## 2016-10-25 DIAGNOSIS — F17200 Nicotine dependence, unspecified, uncomplicated: Secondary | ICD-10-CM | POA: Diagnosis present

## 2016-10-25 DIAGNOSIS — S065X0A Traumatic subdural hemorrhage without loss of consciousness, initial encounter: Principal | ICD-10-CM | POA: Diagnosis present

## 2016-10-25 DIAGNOSIS — S0990XA Unspecified injury of head, initial encounter: Secondary | ICD-10-CM

## 2016-10-25 DIAGNOSIS — K219 Gastro-esophageal reflux disease without esophagitis: Secondary | ICD-10-CM | POA: Diagnosis present

## 2016-10-25 DIAGNOSIS — W010XXA Fall on same level from slipping, tripping and stumbling without subsequent striking against object, initial encounter: Secondary | ICD-10-CM | POA: Diagnosis present

## 2016-10-25 DIAGNOSIS — I6200 Nontraumatic subdural hemorrhage, unspecified: Secondary | ICD-10-CM | POA: Diagnosis not present

## 2016-10-25 DIAGNOSIS — E785 Hyperlipidemia, unspecified: Secondary | ICD-10-CM | POA: Diagnosis present

## 2016-10-25 DIAGNOSIS — S0003XA Contusion of scalp, initial encounter: Secondary | ICD-10-CM | POA: Diagnosis not present

## 2016-10-25 DIAGNOSIS — G4489 Other headache syndrome: Secondary | ICD-10-CM | POA: Diagnosis not present

## 2016-10-25 DIAGNOSIS — I10 Essential (primary) hypertension: Secondary | ICD-10-CM | POA: Diagnosis present

## 2016-10-25 DIAGNOSIS — G309 Alzheimer's disease, unspecified: Secondary | ICD-10-CM | POA: Diagnosis not present

## 2016-10-25 DIAGNOSIS — S299XXA Unspecified injury of thorax, initial encounter: Secondary | ICD-10-CM | POA: Diagnosis not present

## 2016-10-25 DIAGNOSIS — R030 Elevated blood-pressure reading, without diagnosis of hypertension: Secondary | ICD-10-CM | POA: Diagnosis not present

## 2016-10-25 DIAGNOSIS — S065X9A Traumatic subdural hemorrhage with loss of consciousness of unspecified duration, initial encounter: Secondary | ICD-10-CM | POA: Diagnosis present

## 2016-10-25 DIAGNOSIS — E1169 Type 2 diabetes mellitus with other specified complication: Secondary | ICD-10-CM

## 2016-10-25 DIAGNOSIS — S065XAA Traumatic subdural hemorrhage with loss of consciousness status unknown, initial encounter: Secondary | ICD-10-CM | POA: Diagnosis present

## 2016-10-25 DIAGNOSIS — E782 Mixed hyperlipidemia: Secondary | ICD-10-CM | POA: Diagnosis present

## 2016-10-25 DIAGNOSIS — W03XXXA Other fall on same level due to collision with another person, initial encounter: Secondary | ICD-10-CM | POA: Diagnosis present

## 2016-10-25 DIAGNOSIS — R402413 Glasgow coma scale score 13-15, at hospital admission: Secondary | ICD-10-CM | POA: Diagnosis present

## 2016-10-25 DIAGNOSIS — E118 Type 2 diabetes mellitus with unspecified complications: Secondary | ICD-10-CM | POA: Diagnosis present

## 2016-10-25 LAB — CBG MONITORING, ED: GLUCOSE-CAPILLARY: 191 mg/dL — AB (ref 65–99)

## 2016-10-25 MED ORDER — SENNA 8.6 MG PO TABS
1.0000 | ORAL_TABLET | Freq: Every day | ORAL | Status: DC
  Administered 2016-10-26 – 2016-10-27 (×3): 8.6 mg via ORAL
  Filled 2016-10-25 (×3): qty 1

## 2016-10-25 MED ORDER — INSULIN ASPART 100 UNIT/ML ~~LOC~~ SOLN
0.0000 [IU] | SUBCUTANEOUS | Status: DC
  Administered 2016-10-26: 2 [IU] via SUBCUTANEOUS
  Administered 2016-10-26 – 2016-10-27 (×3): 1 [IU] via SUBCUTANEOUS
  Administered 2016-10-27 (×2): 2 [IU] via SUBCUTANEOUS
  Administered 2016-10-27 – 2016-10-28 (×3): 1 [IU] via SUBCUTANEOUS

## 2016-10-25 MED ORDER — CARBAMAZEPINE 200 MG PO TABS
200.0000 mg | ORAL_TABLET | Freq: Three times a day (TID) | ORAL | Status: DC
  Administered 2016-10-26 – 2016-10-28 (×8): 200 mg via ORAL
  Filled 2016-10-25 (×11): qty 1

## 2016-10-25 MED ORDER — PANTOPRAZOLE SODIUM 40 MG PO TBEC
40.0000 mg | DELAYED_RELEASE_TABLET | Freq: Every day | ORAL | Status: DC
  Administered 2016-10-26 – 2016-10-28 (×3): 40 mg via ORAL
  Filled 2016-10-25 (×3): qty 1

## 2016-10-25 MED ORDER — LISINOPRIL 10 MG PO TABS
10.0000 mg | ORAL_TABLET | Freq: Every day | ORAL | Status: DC
  Administered 2016-10-26 – 2016-10-28 (×3): 10 mg via ORAL
  Filled 2016-10-25 (×3): qty 1

## 2016-10-25 MED ORDER — LATANOPROST 0.005 % OP SOLN
1.0000 [drp] | Freq: Every day | OPHTHALMIC | Status: DC
  Administered 2016-10-26 – 2016-10-27 (×2): 1 [drp] via OPHTHALMIC
  Filled 2016-10-25: qty 2.5

## 2016-10-25 MED ORDER — SIMVASTATIN 20 MG PO TABS
40.0000 mg | ORAL_TABLET | Freq: Every evening | ORAL | Status: DC
Start: 2016-10-25 — End: 2016-10-28
  Administered 2016-10-26 – 2016-10-27 (×3): 40 mg via ORAL
  Filled 2016-10-25 (×3): qty 2

## 2016-10-25 MED ORDER — ACETAMINOPHEN 500 MG PO TABS
1000.0000 mg | ORAL_TABLET | Freq: Once | ORAL | Status: AC
  Administered 2016-10-25: 1000 mg via ORAL
  Filled 2016-10-25: qty 2

## 2016-10-25 MED ORDER — DONEPEZIL HCL 5 MG PO TABS
10.0000 mg | ORAL_TABLET | Freq: Every day | ORAL | Status: DC
  Administered 2016-10-26 – 2016-10-27 (×3): 10 mg via ORAL
  Filled 2016-10-25 (×3): qty 2

## 2016-10-25 MED ORDER — MEMANTINE HCL 10 MG PO TABS
10.0000 mg | ORAL_TABLET | Freq: Two times a day (BID) | ORAL | Status: DC
  Administered 2016-10-26 – 2016-10-28 (×6): 10 mg via ORAL
  Filled 2016-10-25 (×6): qty 1

## 2016-10-25 MED ORDER — DEXTROSE 5 % IV SOLN
INTRAVENOUS | Status: DC
  Administered 2016-10-26 – 2016-10-27 (×2): via INTRAVENOUS

## 2016-10-25 MED ORDER — TAMSULOSIN HCL 0.4 MG PO CAPS
0.4000 mg | ORAL_CAPSULE | Freq: Every evening | ORAL | Status: DC
  Administered 2016-10-26 – 2016-10-27 (×2): 0.4 mg via ORAL
  Filled 2016-10-25 (×2): qty 1

## 2016-10-25 MED ORDER — OLANZAPINE 5 MG PO TABS
10.0000 mg | ORAL_TABLET | Freq: Every day | ORAL | Status: DC
  Administered 2016-10-26 – 2016-10-27 (×3): 10 mg via ORAL
  Filled 2016-10-25 (×3): qty 2

## 2016-10-25 MED ORDER — ACETAMINOPHEN 325 MG PO TABS: 650.0000 mg | ORAL_TABLET | Freq: Four times a day (QID) | ORAL | Status: DC | PRN

## 2016-10-25 MED ORDER — FOLIC ACID 1 MG PO TABS
1.0000 mg | ORAL_TABLET | Freq: Every day | ORAL | Status: DC
  Administered 2016-10-26 – 2016-10-28 (×3): 1 mg via ORAL
  Filled 2016-10-25 (×3): qty 1

## 2016-10-25 MED ORDER — METFORMIN HCL 500 MG PO TABS
1000.0000 mg | ORAL_TABLET | Freq: Two times a day (BID) | ORAL | Status: DC
  Administered 2016-10-26 – 2016-10-28 (×5): 1000 mg via ORAL
  Filled 2016-10-25 (×5): qty 2

## 2016-10-25 MED ORDER — SODIUM CHLORIDE 0.9% FLUSH
3.0000 mL | Freq: Two times a day (BID) | INTRAVENOUS | Status: DC
  Administered 2016-10-26 – 2016-10-27 (×3): 3 mL via INTRAVENOUS

## 2016-10-25 NOTE — ED Triage Notes (Signed)
5 cm hematoma to right back side of head
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
## 2016-10-26 ENCOUNTER — Observation Stay (HOSPITAL_COMMUNITY): Payer: Medicare Other

## 2016-10-26 DIAGNOSIS — W010XXA Fall on same level from slipping, tripping and stumbling without subsequent striking against object, initial encounter: Secondary | ICD-10-CM | POA: Diagnosis present

## 2016-10-26 DIAGNOSIS — S065X0D Traumatic subdural hemorrhage without loss of consciousness, subsequent encounter: Secondary | ICD-10-CM | POA: Diagnosis not present

## 2016-10-26 DIAGNOSIS — I10 Essential (primary) hypertension: Secondary | ICD-10-CM | POA: Diagnosis present

## 2016-10-26 DIAGNOSIS — W03XXXA Other fall on same level due to collision with another person, initial encounter: Secondary | ICD-10-CM | POA: Diagnosis present

## 2016-10-26 DIAGNOSIS — Z8679 Personal history of other diseases of the circulatory system: Secondary | ICD-10-CM | POA: Diagnosis present

## 2016-10-26 DIAGNOSIS — F0390 Unspecified dementia without behavioral disturbance: Secondary | ICD-10-CM | POA: Diagnosis not present

## 2016-10-26 DIAGNOSIS — F209 Schizophrenia, unspecified: Secondary | ICD-10-CM | POA: Diagnosis not present

## 2016-10-26 DIAGNOSIS — G309 Alzheimer's disease, unspecified: Secondary | ICD-10-CM | POA: Diagnosis present

## 2016-10-26 DIAGNOSIS — E1165 Type 2 diabetes mellitus with hyperglycemia: Secondary | ICD-10-CM | POA: Diagnosis present

## 2016-10-26 DIAGNOSIS — E785 Hyperlipidemia, unspecified: Secondary | ICD-10-CM | POA: Diagnosis present

## 2016-10-26 DIAGNOSIS — E119 Type 2 diabetes mellitus without complications: Secondary | ICD-10-CM | POA: Diagnosis not present

## 2016-10-26 DIAGNOSIS — F17200 Nicotine dependence, unspecified, uncomplicated: Secondary | ICD-10-CM | POA: Diagnosis present

## 2016-10-26 DIAGNOSIS — R402413 Glasgow coma scale score 13-15, at hospital admission: Secondary | ICD-10-CM | POA: Diagnosis present

## 2016-10-26 DIAGNOSIS — S065X9A Traumatic subdural hemorrhage with loss of consciousness of unspecified duration, initial encounter: Secondary | ICD-10-CM | POA: Diagnosis present

## 2016-10-26 DIAGNOSIS — S065X0A Traumatic subdural hemorrhage without loss of consciousness, initial encounter: Secondary | ICD-10-CM | POA: Diagnosis present

## 2016-10-26 DIAGNOSIS — K219 Gastro-esophageal reflux disease without esophagitis: Secondary | ICD-10-CM | POA: Diagnosis present

## 2016-10-26 DIAGNOSIS — F0280 Dementia in other diseases classified elsewhere without behavioral disturbance: Secondary | ICD-10-CM | POA: Diagnosis present

## 2016-10-26 DIAGNOSIS — Y92099 Unspecified place in other non-institutional residence as the place of occurrence of the external cause: Secondary | ICD-10-CM | POA: Diagnosis not present

## 2016-10-26 DIAGNOSIS — I6200 Nontraumatic subdural hemorrhage, unspecified: Secondary | ICD-10-CM | POA: Diagnosis not present

## 2016-10-26 LAB — GLUCOSE, CAPILLARY
GLUCOSE-CAPILLARY: 111 mg/dL — AB (ref 65–99)
GLUCOSE-CAPILLARY: 131 mg/dL — AB (ref 65–99)
GLUCOSE-CAPILLARY: 164 mg/dL — AB (ref 65–99)
Glucose-Capillary: 101 mg/dL — ABNORMAL HIGH (ref 65–99)
Glucose-Capillary: 128 mg/dL — ABNORMAL HIGH (ref 65–99)
Glucose-Capillary: 112 mg/dL — ABNORMAL HIGH (ref 65–99)

## 2016-10-26 LAB — MRSA PCR SCREENING: MRSA BY PCR: NEGATIVE

## 2016-10-26 NOTE — Progress Notes (Signed)
Subjective: Patient was admitted from [facility] assisted living due to subdural hematoma. Neurosurgery was consult by phone and reviewed the Cat scan and advised to repeat today. Patient is resting  Objective: Vital signs in last 24 hours: Temp:  [92.8 F (33.8 C)-98.5 F (36.9 C)] 97.5 F (36.4 C) (03/10 0400) Pulse Rate:  [78-100] 87 (03/10 0800) Resp:  [17-30] 19 (03/10 0800) BP: (119-160)/(72-105) 129/78 (03/10 0800) SpO2:  [91 %-100 %] 91 % (03/10 0800) Weight:  [92.5 kg (203 lb 14.8 oz)-93 kg (205 lb)] 92.8 kg (204 lb 9.4 oz) (03/10 0400) Weight change:  Last BM Date: 10/26/16  Intake/Output from previous day: 03/09 0701 - 03/10 0700 In: 254.2 [I.V.:254.2] Out: -   PHYSICAL EXAM General appearance: no distress and slowed mentation Resp: clear to auscultation bilaterally Cardio: S1, S2 normal GI: soft, non-tender; bowel sounds normal; no masses,  no organomegaly Extremities: extremities normal, atraumatic, no cyanosis or edema  Lab Results:  Results for orders placed or performed during the hospital encounter of 10/25/16 (from the past 48 hour(s))  CBG monitoring, ED     Status: Abnormal   Collection Time: 10/25/16  6:21 PM  Result Value Ref Range   Glucose-Capillary 191 (H) 65 - 99 mg/dL  MRSA PCR Screening     Status: None   Collection Time: 10/25/16 10:53 PM  Result Value Ref Range   MRSA by PCR NEGATIVE NEGATIVE    Comment:        The GeneXpert MRSA Assay (FDA approved for NASAL specimens only), is one component of a comprehensive MRSA colonization surveillance program. It is not intended to diagnose MRSA infection nor to guide or monitor treatment for MRSA infections.   Glucose, capillary     Status: Abnormal   Collection Time: 10/26/16 12:28 AM  Result Value Ref Range   Glucose-Capillary 111 (H) 65 - 99 mg/dL   Comment 1 Notify RN    Comment 2 Document in Chart   Glucose, capillary     Status: Abnormal   Collection Time: 10/26/16  4:34 AM  Result  Value Ref Range   Glucose-Capillary 101 (H) 65 - 99 mg/dL   Comment 1 Notify RN    Comment 2 Document in Chart   Glucose, capillary     Status: Abnormal   Collection Time: 10/26/16  7:44 AM  Result Value Ref Range   Glucose-Capillary 128 (H) 65 - 99 mg/dL    ABGS No results for input(s): PHART, PO2ART, TCO2, HCO3 in the last 72 hours.  Invalid input(s): PCO2 CULTURES Recent Results (from the past 240 hour(s))  MRSA PCR Screening     Status: None   Collection Time: 10/25/16 10:53 PM  Result Value Ref Range Status   MRSA by PCR NEGATIVE NEGATIVE Final    Comment:        The GeneXpert MRSA Assay (FDA approved for NASAL specimens only), is one component of a comprehensive MRSA colonization surveillance program. It is not intended to diagnose MRSA infection nor to guide or monitor treatment for MRSA infections.    Studies/Results: Dg Thoracic Spine 4v  Result Date: 10/25/2016 CLINICAL DATA:  Fall.  Head trauma.  Back pain. EXAM: THORACIC SPINE - 4+ VIEW COMPARISON:  Two-view chest x-ray 12/08/2014. FINDINGS: Twelve rib-bearing thoracic type vertebral bodies are present. Vertebral body heights are preserved. Alignment is anatomic. There is no acute fracture or traumatic subluxation. Atherosclerosis of the aorta is again seen. Bilateral lower lobe atelectasis or scarring is present. IMPRESSION: 1. No acute  abnormality. Electronically Signed   By: Marin Roberts M.D.   On: 10/25/2016 19:24   Ct Head Wo Contrast  Result Date: 10/25/2016 CLINICAL DATA:  76 y/o  M; fall with head trauma. EXAM: CT HEAD WITHOUT CONTRAST CT CERVICAL SPINE WITHOUT CONTRAST TECHNIQUE: Multidetector CT imaging of the head and cervical spine was performed following the standard protocol without intravenous contrast. Multiplanar CT image reconstructions of the cervical spine were also generated. COMPARISON:  12/08/2014 CT of the head. FINDINGS: CT HEAD FINDINGS Brain: Subdural hematoma overlying the right  frontal convexity measuring up to 5 mm, along the falx, and right tentorium cerebelli. Minimal right to left midline shift of 2 mm. Possible trace subdural hemorrhage over the left frontal convexity (series 4, image 19). No large acute infarct, brain parenchymal hemorrhage, or effacement of basilar cisterns. Vascular: Calcific atherosclerosis of the cavernous and paraclinoid internal carotid arteries. Skull: Scalp contusion overlying the right parietal occipital suture. No displaced calvarial fracture. 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 Visualized paranasal sinuses and mastoid air cells are otherwise normally aerated. Other: Dermal lesions superolateral to the left orbit probably representing dermal appendage cysts. CT CERVICAL SPINE FINDINGS Alignment: Straightening of cervical lordosis with reversal at the C3-4 level. No dislocation. Skull base and vertebrae: Linear defect in the posterior arch of C1 without displacement (series 11, image 31). It is uncertain if this represents congenital incomplete fusion or a nondisplaced fracture. No additional potential cervical fracture identified. Soft tissues and spinal canal: No prevertebral fluid or swelling. No visible canal hematoma. Disc levels: Multilevel and back cervical spondylosis greatest from the C4 through C7 levels. Uncovertebral and facet hypertrophy narrows the left-sided C3-4 and right-sided C4-5, C6-7 neural foramen. Disc osteophyte complexes result in multiple levels of canal stenosis greatest at the C4-5 level where it is at least moderate. Upper chest: Negative. Other: Calcific atherosclerosis of carotid bifurcations. Motion degraded aerodigestive tract. IMPRESSION: 1. Acute subdural hematoma overlying the right frontal convexity, along the falx, and right tentorium cerebelli with minimal right-to-left midline shift of 2 mm. Possible trace acute subdural hematoma  over the left frontal convexity. 2. No evidence for brain parenchymal hemorrhage, large acute infarct, or herniation. 3. Soft tissue contusion of the right parietal occipital scalp. No displaced calvarial fracture. 4. Linear defect of the posterior arch of C1 without displacement. This may represent congenital incomplete fusion or a nondisplaced fracture. No additional potential cervical fracture identified. 5. Advanced cervical spondylosis with multiple levels of canal stenosis greatest at C4-5 where it is at least moderate. These results were called by telephone at the time of interpretation on 10/25/2016 at 8:00 pm to Dr. Eber Hong , who verbally acknowledged these results. Electronically Signed   By: Mitzi Hansen M.D.   On: 10/25/2016 20:03   Ct Cervical Spine Wo Contrast  Result Date: 10/25/2016 CLINICAL DATA:  76 y/o  M; fall with head trauma. EXAM: CT HEAD WITHOUT CONTRAST CT CERVICAL SPINE WITHOUT CONTRAST TECHNIQUE: Multidetector CT imaging of the head and cervical spine was performed following the standard protocol without intravenous contrast. Multiplanar CT image reconstructions of the cervical spine were also generated. COMPARISON:  12/08/2014 CT of the head. FINDINGS: CT HEAD FINDINGS Brain: Subdural hematoma overlying the right frontal convexity measuring up to 5 mm, along the falx, and right tentorium cerebelli. Minimal right to left midline shift of 2 mm. Possible trace subdural hemorrhage over the left frontal convexity (series 4, image 19).  No large acute infarct, brain parenchymal hemorrhage, or effacement of basilar cisterns. Vascular: Calcific atherosclerosis of the cavernous and paraclinoid internal carotid arteries. Skull: Scalp contusion overlying the right parietal occipital suture. No displaced calvarial fracture. 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 Visualized paranasal sinuses and  mastoid air cells are otherwise normally aerated. Other: Dermal lesions superolateral to the left orbit probably representing dermal appendage cysts. CT CERVICAL SPINE FINDINGS Alignment: Straightening of cervical lordosis with reversal at the C3-4 level. No dislocation. Skull base and vertebrae: Linear defect in the posterior arch of C1 without displacement (series 11, image 31). It is uncertain if this represents congenital incomplete fusion or a nondisplaced fracture. No additional potential cervical fracture identified. Soft tissues and spinal canal: No prevertebral fluid or swelling. No visible canal hematoma. Disc levels: Multilevel and back cervical spondylosis greatest from the C4 through C7 levels. Uncovertebral and facet hypertrophy narrows the left-sided C3-4 and right-sided C4-5, C6-7 neural foramen. Disc osteophyte complexes result in multiple levels of canal stenosis greatest at the C4-5 level where it is at least moderate. Upper chest: Negative. Other: Calcific atherosclerosis of carotid bifurcations. Motion degraded aerodigestive tract. IMPRESSION: 1. Acute subdural hematoma overlying the right frontal convexity, along the falx, and right tentorium cerebelli with minimal right-to-left midline shift of 2 mm. Possible trace acute subdural hematoma over the left frontal convexity. 2. No evidence for brain parenchymal hemorrhage, large acute infarct, or herniation. 3. Soft tissue contusion of the right parietal occipital scalp. No displaced calvarial fracture. 4. Linear defect of the posterior arch of C1 without displacement. This may represent congenital incomplete fusion or a nondisplaced fracture. No additional potential cervical fracture identified. 5. Advanced cervical spondylosis with multiple levels of canal stenosis greatest at C4-5 where it is at least moderate. These results were called by telephone at the time of interpretation on 10/25/2016 at 8:00 pm to Dr. Eber HongBRIAN MILLER , who verbally  acknowledged these results. Electronically Signed   By: Mitzi HansenLance  Furusawa-Stratton M.D.   On: 10/25/2016 20:03    Medications: I have reviewed the patient's current medications.  Assesment:   Active Problems:   Type 2 diabetes mellitus, uncontrolled (HCC)   Dementia   Schizophrenia (HCC)   Hypertension   Hyperlipidemia   Subdural hematoma, acute (HCC)   Subdural hematoma (HCC)    Plan:  Medications reviewed Continue telemetry Neuro check Repeat CT Scan as recommended Continue regular treatrment    LOS: 0 days   Mykeal Carrick 10/26/2016, 9:47 AM

## 2016-10-27 LAB — GLUCOSE, CAPILLARY
GLUCOSE-CAPILLARY: 103 mg/dL — AB (ref 65–99)
GLUCOSE-CAPILLARY: 107 mg/dL — AB (ref 65–99)
GLUCOSE-CAPILLARY: 128 mg/dL — AB (ref 65–99)
GLUCOSE-CAPILLARY: 135 mg/dL — AB (ref 65–99)
GLUCOSE-CAPILLARY: 155 mg/dL — AB (ref 65–99)
GLUCOSE-CAPILLARY: 163 mg/dL — AB (ref 65–99)

## 2016-10-27 NOTE — Progress Notes (Signed)
Patient transferred to 300 department via bed.  Report given to nurse.  

## 2016-10-27 NOTE — Progress Notes (Signed)
Subjective: Patient is resting. He is improving. Repeat CT of the chest was done and change in the dural hematoma.  Objective: Vital signs in last 24 hours: Temp:  [98.5 F (36.9 C)-99.4 F (37.4 C)] 99.4 F (37.4 C) (03/11 0750) Pulse Rate:  [59-104] 104 (03/11 1100) Resp:  [18-29] 19 (03/11 1000) BP: (103-133)/(60-81) 107/60 (03/11 1100) SpO2:  [93 %-100 %] 95 % (03/11 1100) Weight:  [91.3 kg (201 lb 4.5 oz)] 91.3 kg (201 lb 4.5 oz) (03/11 0500) Weight change: -1.687 kg (-3 lb 11.5 oz) Last BM Date: 10/26/16  Intake/Output from previous day: 03/10 0701 - 03/11 0700 In: 1169.7 [I.V.:1169.7] Out: 850 [Urine:850]  PHYSICAL EXAM General appearance: no distress and slowed mentation Resp: clear to auscultation bilaterally Cardio: S1, S2 normal GI: soft, non-tender; bowel sounds normal; no masses,  no organomegaly Extremities: extremities normal, atraumatic, no cyanosis or edema  Lab Results:  Results for orders placed or performed during the hospital encounter of 10/25/16 (from the past 48 hour(s))  CBG monitoring, ED     Status: Abnormal   Collection Time: 10/25/16  6:21 PM  Result Value Ref Range   Glucose-Capillary 191 (H) 65 - 99 mg/dL  MRSA PCR Screening     Status: None   Collection Time: 10/25/16 10:53 PM  Result Value Ref Range   MRSA by PCR NEGATIVE NEGATIVE    Comment:        The GeneXpert MRSA Assay (FDA approved for NASAL specimens only), is one component of a comprehensive MRSA colonization surveillance program. It is not intended to diagnose MRSA infection nor to guide or monitor treatment for MRSA infections.   Glucose, capillary     Status: Abnormal   Collection Time: 10/26/16 12:28 AM  Result Value Ref Range   Glucose-Capillary 111 (H) 65 - 99 mg/dL   Comment 1 Notify RN    Comment 2 Document in Chart   Glucose, capillary     Status: Abnormal   Collection Time: 10/26/16  4:34 AM  Result Value Ref Range   Glucose-Capillary 101 (H) 65 - 99 mg/dL    Comment 1 Notify RN    Comment 2 Document in Chart   Glucose, capillary     Status: Abnormal   Collection Time: 10/26/16  7:44 AM  Result Value Ref Range   Glucose-Capillary 128 (H) 65 - 99 mg/dL  Glucose, capillary     Status: Abnormal   Collection Time: 10/26/16 11:48 AM  Result Value Ref Range   Glucose-Capillary 112 (H) 65 - 99 mg/dL   Comment 1 Notify RN   Glucose, capillary     Status: Abnormal   Collection Time: 10/26/16  4:21 PM  Result Value Ref Range   Glucose-Capillary 131 (H) 65 - 99 mg/dL  Glucose, capillary     Status: Abnormal   Collection Time: 10/26/16  8:13 PM  Result Value Ref Range   Glucose-Capillary 164 (H) 65 - 99 mg/dL  Glucose, capillary     Status: Abnormal   Collection Time: 10/27/16 12:13 AM  Result Value Ref Range   Glucose-Capillary 103 (H) 65 - 99 mg/dL  Glucose, capillary     Status: Abnormal   Collection Time: 10/27/16  4:09 AM  Result Value Ref Range   Glucose-Capillary 107 (H) 65 - 99 mg/dL  Glucose, capillary     Status: Abnormal   Collection Time: 10/27/16  7:51 AM  Result Value Ref Range   Glucose-Capillary 135 (H) 65 - 99 mg/dL  ABGS No results for input(s): PHART, PO2ART, TCO2, HCO3 in the last 72 hours.  Invalid input(s): PCO2 CULTURES Recent Results (from the past 240 hour(s))  MRSA PCR Screening     Status: None   Collection Time: 10/25/16 10:53 PM  Result Value Ref Range Status   MRSA by PCR NEGATIVE NEGATIVE Final    Comment:        The GeneXpert MRSA Assay (FDA approved for NASAL specimens only), is one component of a comprehensive MRSA colonization surveillance program. It is not intended to diagnose MRSA infection nor to guide or monitor treatment for MRSA infections.    Studies/Results: Dg Thoracic Spine 4v  Result Date: 10/25/2016 CLINICAL DATA:  Fall.  Head trauma.  Back pain. EXAM: THORACIC SPINE - 4+ VIEW COMPARISON:  Two-view chest x-ray 12/08/2014. FINDINGS: Twelve rib-bearing thoracic type vertebral  bodies are present. Vertebral body heights are preserved. Alignment is anatomic. There is no acute fracture or traumatic subluxation. Atherosclerosis of the aorta is again seen. Bilateral lower lobe atelectasis or scarring is present. IMPRESSION: 1. No acute abnormality. Electronically Signed   By: Marin Roberts M.D.   On: 10/25/2016 19:24   Ct Head Wo Contrast  Result Date: 10/26/2016 CLINICAL DATA:  Follow-up subdural hematoma EXAM: CT HEAD WITHOUT CONTRAST TECHNIQUE: Contiguous axial images were obtained from the base of the skull through the vertex without intravenous contrast. COMPARISON:  10/25/2016 FINDINGS: Brain: Persistent subdural hematoma is noted best visualized along the falx and tentorium on the right. The right frontal component is less well visualized on the current exam. No new focal hemorrhage or acute infarction is seen. Vascular: No hyperdense vessel or unexpected calcification. Skull: Normal. Negative for fracture or focal lesion. Sinuses/Orbits: No acute finding. Other: None. IMPRESSION: Persistent right-sided subdural hematoma. No new focal abnormality is seen. The right frontal component is less well visualized on the current exam. Electronically Signed   By: Alcide Clever M.D.   On: 10/26/2016 10:56   Ct Head Wo Contrast  Result Date: 10/25/2016 CLINICAL DATA:  76 y/o  M; fall with head trauma. EXAM: CT HEAD WITHOUT CONTRAST CT CERVICAL SPINE WITHOUT CONTRAST TECHNIQUE: Multidetector CT imaging of the head and cervical spine was performed following the standard protocol without intravenous contrast. Multiplanar CT image reconstructions of the cervical spine were also generated. COMPARISON:  12/08/2014 CT of the head. FINDINGS: CT HEAD FINDINGS Brain: Subdural hematoma overlying the right frontal convexity measuring up to 5 mm, along the falx, and right tentorium cerebelli. Minimal right to left midline shift of 2 mm. Possible trace subdural hemorrhage over the left frontal  convexity (series 4, image 19). No large acute infarct, brain parenchymal hemorrhage, or effacement of basilar cisterns. Vascular: Calcific atherosclerosis of the cavernous and paraclinoid internal carotid arteries. Skull: Scalp contusion overlying the right parietal occipital suture. No displaced calvarial fracture. 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 Visualized paranasal sinuses and mastoid air cells are otherwise normally aerated. Other: Dermal lesions superolateral to the left orbit probably representing dermal appendage cysts. CT CERVICAL SPINE FINDINGS Alignment: Straightening of cervical lordosis with reversal at the C3-4 level. No dislocation. Skull base and vertebrae: Linear defect in the posterior arch of C1 without displacement (series 11, image 31). It is uncertain if this represents congenital incomplete fusion or a nondisplaced fracture. No additional potential cervical fracture identified. Soft tissues and spinal canal: No prevertebral fluid or swelling. No visible canal hematoma. Disc levels: Multilevel  and back cervical spondylosis greatest from the C4 through C7 levels. Uncovertebral and facet hypertrophy narrows the left-sided C3-4 and right-sided C4-5, C6-7 neural foramen. Disc osteophyte complexes result in multiple levels of canal stenosis greatest at the C4-5 level where it is at least moderate. Upper chest: Negative. Other: Calcific atherosclerosis of carotid bifurcations. Motion degraded aerodigestive tract. IMPRESSION: 1. Acute subdural hematoma overlying the right frontal convexity, along the falx, and right tentorium cerebelli with minimal right-to-left midline shift of 2 mm. Possible trace acute subdural hematoma over the left frontal convexity. 2. No evidence for brain parenchymal hemorrhage, large acute infarct, or herniation. 3. Soft tissue contusion of the right parietal occipital scalp. No  displaced calvarial fracture. 4. Linear defect of the posterior arch of C1 without displacement. This may represent congenital incomplete fusion or a nondisplaced fracture. No additional potential cervical fracture identified. 5. Advanced cervical spondylosis with multiple levels of canal stenosis greatest at C4-5 where it is at least moderate. These results were called by telephone at the time of interpretation on 10/25/2016 at 8:00 pm to Dr. Eber Hong , who verbally acknowledged these results. Electronically Signed   By: Mitzi Hansen M.D.   On: 10/25/2016 20:03   Ct Cervical Spine Wo Contrast  Result Date: 10/25/2016 CLINICAL DATA:  76 y/o  M; fall with head trauma. EXAM: CT HEAD WITHOUT CONTRAST CT CERVICAL SPINE WITHOUT CONTRAST TECHNIQUE: Multidetector CT imaging of the head and cervical spine was performed following the standard protocol without intravenous contrast. Multiplanar CT image reconstructions of the cervical spine were also generated. COMPARISON:  12/08/2014 CT of the head. FINDINGS: CT HEAD FINDINGS Brain: Subdural hematoma overlying the right frontal convexity measuring up to 5 mm, along the falx, and right tentorium cerebelli. Minimal right to left midline shift of 2 mm. Possible trace subdural hemorrhage over the left frontal convexity (series 4, image 19). No large acute infarct, brain parenchymal hemorrhage, or effacement of basilar cisterns. Vascular: Calcific atherosclerosis of the cavernous and paraclinoid internal carotid arteries. Skull: Scalp contusion overlying the right parietal occipital suture. No displaced calvarial fracture. 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 Visualized paranasal sinuses and mastoid air cells are otherwise normally aerated. Other: Dermal lesions superolateral to the left orbit probably representing dermal appendage cysts. CT CERVICAL SPINE FINDINGS Alignment:  Straightening of cervical lordosis with reversal at the C3-4 level. No dislocation. Skull base and vertebrae: Linear defect in the posterior arch of C1 without displacement (series 11, image 31). It is uncertain if this represents congenital incomplete fusion or a nondisplaced fracture. No additional potential cervical fracture identified. Soft tissues and spinal canal: No prevertebral fluid or swelling. No visible canal hematoma. Disc levels: Multilevel and back cervical spondylosis greatest from the C4 through C7 levels. Uncovertebral and facet hypertrophy narrows the left-sided C3-4 and right-sided C4-5, C6-7 neural foramen. Disc osteophyte complexes result in multiple levels of canal stenosis greatest at the C4-5 level where it is at least moderate. Upper chest: Negative. Other: Calcific atherosclerosis of carotid bifurcations. Motion degraded aerodigestive tract. IMPRESSION: 1. Acute subdural hematoma overlying the right frontal convexity, along the falx, and right tentorium cerebelli with minimal right-to-left midline shift of 2 mm. Possible trace acute subdural hematoma over the left frontal convexity. 2. No evidence for brain parenchymal hemorrhage, large acute infarct, or herniation. 3. Soft tissue contusion of the right parietal occipital scalp. No displaced calvarial fracture. 4. Linear defect of the posterior arch of C1  without displacement. This may represent congenital incomplete fusion or a nondisplaced fracture. No additional potential cervical fracture identified. 5. Advanced cervical spondylosis with multiple levels of canal stenosis greatest at C4-5 where it is at least moderate. These results were called by telephone at the time of interpretation on 10/25/2016 at 8:00 pm to Dr. Eber HongBRIAN MILLER , who verbally acknowledged these results. Electronically Signed   By: Mitzi HansenLance  Furusawa-Stratton M.D.   On: 10/25/2016 20:03    Medications: I have reviewed the patient's current medications.  Assesment:    Active Problems:   Type 2 diabetes mellitus, uncontrolled (HCC)   Dementia   Schizophrenia (HCC)   Hypertension   Hyperlipidemia   Subdural hematoma, acute (HCC)   Subdural hematoma (HCC)   SDH (subdural hematoma) (HCC)    Plan:  Medications reviewed Can move out of  ICU Neuro check q 6 hours Continue regular treatrment    LOS: 1 day   Percell Lamboy 10/27/2016, 11:20 AM

## 2016-10-28 ENCOUNTER — Inpatient Hospital Stay (HOSPITAL_COMMUNITY): Payer: Medicare Other

## 2016-10-28 LAB — GLUCOSE, CAPILLARY
GLUCOSE-CAPILLARY: 99 mg/dL (ref 65–99)
Glucose-Capillary: 104 mg/dL — ABNORMAL HIGH (ref 65–99)
Glucose-Capillary: 136 mg/dL — ABNORMAL HIGH (ref 65–99)
Glucose-Capillary: 137 mg/dL — ABNORMAL HIGH (ref 65–99)

## 2016-10-28 NOTE — Progress Notes (Signed)
Patient being taking back to [name] IV cath removed by patient during prior shift. Instructions given and verbalizes understanding. Awaiting transport out.
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
## 2016-10-28 NOTE — Progress Notes (Signed)
Subjective: He's awake and alert. Very poor historian. He generally mumbles and talks to himself and then tends to burst out laughing. No new problems noted overnight. Neuro checks of been stable.  Objective: Vital signs in last 24 hours: Temp:  [98.9 F (37.2 C)-99 F (37.2 C)] 98.9 F (37.2 C) (03/12 0400) Pulse Rate:  [85-104] 90 (03/12 0400) Resp:  [18-25] 18 (03/12 0400) BP: (99-152)/(60-106) 152/90 (03/12 0400) SpO2:  [93 %-98 %] 96 % (03/12 0400) Weight change:  Last BM Date: 10/27/16  Intake/Output from previous day: 03/11 0701 - 03/12 0700 In: 679.2 [P.O.:240; I.V.:439.2] Out: 1900 [Urine:1900]  PHYSICAL EXAM General appearance: alert and No distress altered mental status which is his normal state Resp: rhonchi bilaterally Cardio: regular rate and rhythm, S1, S2 normal, no murmur, click, rub or gallop GI: soft, non-tender; bowel sounds normal; no masses,  no organomegaly Extremities: extremities normal, atraumatic, no cyanosis or edema Skin warm and dry. He is edentulous  Lab Results:  Results for orders placed or performed during the hospital encounter of 10/25/16 (from the past 48 hour(s))  Glucose, capillary     Status: Abnormal   Collection Time: 10/26/16 11:48 AM  Result Value Ref Range   Glucose-Capillary 112 (H) 65 - 99 mg/dL   Comment 1 Notify RN   Glucose, capillary     Status: Abnormal   Collection Time: 10/26/16  4:21 PM  Result Value Ref Range   Glucose-Capillary 131 (H) 65 - 99 mg/dL  Glucose, capillary     Status: Abnormal   Collection Time: 10/26/16  8:13 PM  Result Value Ref Range   Glucose-Capillary 164 (H) 65 - 99 mg/dL  Glucose, capillary     Status: Abnormal   Collection Time: 10/27/16 12:13 AM  Result Value Ref Range   Glucose-Capillary 103 (H) 65 - 99 mg/dL  Glucose, capillary     Status: Abnormal   Collection Time: 10/27/16  4:09 AM  Result Value Ref Range   Glucose-Capillary 107 (H) 65 - 99 mg/dL  Glucose, capillary     Status:  Abnormal   Collection Time: 10/27/16  7:51 AM  Result Value Ref Range   Glucose-Capillary 135 (H) 65 - 99 mg/dL  Glucose, capillary     Status: Abnormal   Collection Time: 10/27/16 12:02 PM  Result Value Ref Range   Glucose-Capillary 128 (H) 65 - 99 mg/dL  Glucose, capillary     Status: Abnormal   Collection Time: 10/27/16  3:21 PM  Result Value Ref Range   Glucose-Capillary 155 (H) 65 - 99 mg/dL  Glucose, capillary     Status: Abnormal   Collection Time: 10/27/16  8:13 PM  Result Value Ref Range   Glucose-Capillary 163 (H) 65 - 99 mg/dL  Glucose, capillary     Status: Abnormal   Collection Time: 10/28/16 12:09 AM  Result Value Ref Range   Glucose-Capillary 104 (H) 65 - 99 mg/dL  Glucose, capillary     Status: Abnormal   Collection Time: 10/28/16  4:12 AM  Result Value Ref Range   Glucose-Capillary 136 (H) 65 - 99 mg/dL  Glucose, capillary     Status: Abnormal   Collection Time: 10/28/16  7:31 AM  Result Value Ref Range   Glucose-Capillary 137 (H) 65 - 99 mg/dL   Comment 1 Notify RN    Comment 2 Document in Chart     ABGS No results for input(s): PHART, PO2ART, TCO2, HCO3 in the last 72 hours.  Invalid input(s): PCO2 CULTURES  Recent Results (from the past 240 hour(s))  MRSA PCR Screening     Status: None   Collection Time: 10/25/16 10:53 PM  Result Value Ref Range Status   MRSA by PCR NEGATIVE NEGATIVE Final    Comment:        The GeneXpert MRSA Assay (FDA approved for NASAL specimens only), is one component of a comprehensive MRSA colonization surveillance program. It is not intended to diagnose MRSA infection nor to guide or monitor treatment for MRSA infections.    Studies/Results: Ct Head Wo Contrast  Result Date: 10/26/2016 CLINICAL DATA:  Follow-up subdural hematoma EXAM: CT HEAD WITHOUT CONTRAST TECHNIQUE: Contiguous axial images were obtained from the base of the skull through the vertex without intravenous contrast. COMPARISON:  10/25/2016 FINDINGS:  Brain: Persistent subdural hematoma is noted best visualized along the falx and tentorium on the right. The right frontal component is less well visualized on the current exam. No new focal hemorrhage or acute infarction is seen. Vascular: No hyperdense vessel or unexpected calcification. Skull: Normal. Negative for fracture or focal lesion. Sinuses/Orbits: No acute finding. Other: None. IMPRESSION: Persistent right-sided subdural hematoma. No new focal abnormality is seen. The right frontal component is less well visualized on the current exam. Electronically Signed   By: Alcide CleverMark  Lukens M.D.   On: 10/26/2016 10:56    Medications:  Prior to Admission:  Prescriptions Prior to Admission  Medication Sig Dispense Refill Last Dose  . acetaminophen (TYLENOL) 325 MG tablet Take 650 mg by mouth every 6 (six) hours as needed for mild pain or moderate pain.   unknown at unknown  . bimatoprost (LUMIGAN) 0.01 % SOLN Place 1 drop into both eyes at bedtime.   10/24/2016 at Unknown time  . carbamazepine (TEGRETOL) 200 MG tablet Take 200 mg by mouth 3 (three) times daily.   10/25/2016 at 1400  . clonazePAM (KLONOPIN) 0.5 MG tablet Take 0.5 mg by mouth 2 (two) times daily.    10/25/2016 at Unknown time  . donepezil (ARICEPT) 10 MG tablet Take 10 mg by mouth at bedtime.   10/24/2016 at Unknown time  . folic acid (FOLVITE) 1 MG tablet Take 1 mg by mouth daily.   10/25/2016 at Unknown time  . insulin detemir (LEVEMIR) 100 UNIT/ML injection Inject 0.2 mLs (20 Units total) into the skin every morning. 10 mL 2 10/25/2016 at Unknown time  . lisinopril (PRINIVIL,ZESTRIL) 10 MG tablet Take 10 mg by mouth daily.   10/25/2016 at Unknown time  . memantine (NAMENDA) 10 MG tablet Take 10 mg by mouth 2 (two) times daily.   10/25/2016 at Unknown time  . metFORMIN (GLUCOPHAGE) 1000 MG tablet Take 1 tablet (1,000 mg total) by mouth 2 (two) times daily with a meal. 60 tablet 2 10/25/2016 at Unknown time  . OLANZapine (ZYPREXA) 10 MG tablet Take 10 mg by  mouth at bedtime.   10/24/2016 at Unknown time  . omeprazole (PRILOSEC) 20 MG capsule Take 20 mg by mouth daily.   10/25/2016 at Unknown time  . oxybutynin (DITROPAN) 5 MG tablet Take 5 mg by mouth 2 (two) times daily.   10/25/2016 at Unknown time  . senna (SENOKOT) 8.6 MG TABS tablet Take 1 tablet by mouth at bedtime.   10/24/2016 at Unknown time  . simvastatin (ZOCOR) 40 MG tablet Take 40 mg by mouth every evening.   10/24/2016 at Unknown time  . tamsulosin (FLOMAX) 0.4 MG CAPS Take 0.4 mg by mouth every evening.    10/24/2016 at Unknown  time  . Vitamin D, Ergocalciferol, (DRISDOL) 50000 units CAPS capsule Take 1 capsule (50,000 Units total) by mouth every 7 (seven) days. 4 capsule 2 10/21/2016 at Unknown time   Scheduled: . carbamazepine  200 mg Oral TID  . donepezil  10 mg Oral QHS  . folic acid  1 mg Oral Daily  . insulin aspart  0-9 Units Subcutaneous Q4H  . latanoprost  1 drop Both Eyes QHS  . lisinopril  10 mg Oral Daily  . memantine  10 mg Oral BID  . metFORMIN  1,000 mg Oral BID WC  . OLANZapine  10 mg Oral QHS  . pantoprazole  40 mg Oral Daily  . senna  1 tablet Oral QHS  . simvastatin  40 mg Oral QPM  . sodium chloride flush  3 mL Intravenous Q12H  . tamsulosin  0.4 mg Oral QPM   Continuous: . dextrose 50 mL/hr at 10/27/16 4098   [id number]  Assesment:He has a subdural hematoma. He has not had any neurological changes. Subdural was stable when checked on the 10th which I personally reviewed. He lives at an assisted living facility so on going to have him do another CT and if it has no change I will plan to discharge him. He has hypertension which is well controlled. He has diabetes which has been pretty stable. He has dementia on medications and schizophrenia chronic with chronic altered mental status Active Problems:   Type 2 diabetes mellitus, uncontrolled (HCC)   Dementia   Schizophrenia (HCC)   Hypertension   Hyperlipidemia   Subdural hematoma, acute (HCC)   Subdural  hematoma (HCC)   SDH (subdural hematoma) (HCC)    Plan: Repeat CT today. Possible discharge later today    LOS: 2 days   Holdyn Poyser L 10/28/2016, 8:24 AM

## 2016-10-28 NOTE — [place] FL2 (Signed)
[place] MEDICAID FL2 LEVEL OF CARE SCREENING TOOL     IDENTIFICATION  Patient Name: Andre SergeCharlie Mackie Birthdate: November 14, 1940 Sex: male Admission Date (Current Location): 10/25/2016  [place]:  [place] [id number] Facility and Address:  [facility],  [address], [place] [id number]      Provider Number: [id number]  Attending Physician Name and Address:  Kari BaarsEdward Hawkins, MD  Relative Name and Phone Number:       Current Level of Care: Hospital Recommended Level of Care: [facility] Prior Approval Number:    Date Approved/Denied:   PASRR Number:    Discharge Plan: Other (Comment) [facility])    Current Diagnoses: Patient Active Problem List   Diagnosis Date Noted  . SDH (subdural hematoma) (HCC) 10/26/2016  . Subdural hematoma, acute (HCC) 10/25/2016  . Subdural hematoma (HCC) 10/25/2016  . Vitamin D deficiency 12/08/2015  . Hyperlipidemia 08/02/2015  . Cellulitis of leg, left 12/11/2014  . Type 2 diabetes mellitus, uncontrolled (HCC) 12/08/2014  . Dementia 12/08/2014  . Schizophrenia (HCC) 12/08/2014  . Hypertension 12/08/2014  . HCAP (healthcare-associated pneumonia) 12/08/2014    Orientation RESPIRATION BLADDER Height & Weight     Self  Normal Incontinent Weight: 201 lb 4.5 oz (91.3 kg) Height:  5\' 10"  (177.8 cm)  BEHAVIORAL SYMPTOMS/MOOD NEUROLOGICAL BOWEL NUTRITION STATUS     (n/a) Incontinent Diet (Regular)  AMBULATORY STATUS COMMUNICATION OF NEEDS Skin   Supervision Verbally Normal                       Personal Care Assistance Level of Assistance  Bathing, Feeding, Dressing Bathing Assistance: Limited assistance Feeding assistance: Limited assistance Dressing Assistance: Limited assistance     Functional Limitations Info  Sight, Hearing, Speech Sight Info: Adequate Hearing Info: Adequate Speech Info: Adequate    SPECIAL CARE FACTORS FREQUENCY                       Contractures       Additional Factors Info  Psychotropic, Insulin Sliding Scale Code Status Info: Full code Allergies Info: No known allergies Psychotropic Info: Zyprexa         Current Medications (10/28/2016):  This is the current hospital active medication list Current Facility-Administered Medications  Medication Dose Route Frequency Provider Last Rate Last Dose  . acetaminophen (TYLENOL) tablet 650 mg  650 mg Oral Q6H PRN Houston SirenPeter Le, MD      . carbamazepine (TEGRETOL) tablet 200 mg  200 mg Oral TID Houston SirenPeter Le, MD   200 mg at 10/27/16 2028  . dextrose 5 % solution   Intravenous Continuous Houston SirenPeter Le, MD 50 mL/hr at 10/27/16 [id number]    . donepezil (ARICEPT) tablet 10 mg  10 mg Oral QHS Houston SirenPeter Le, MD   10 mg at 10/27/16 2027  . folic acid (FOLVITE) tablet 1 mg  1 mg Oral Daily Houston SirenPeter Le, MD   1 mg at 10/27/16 0917  . insulin aspart (novoLOG) injection 0-9 Units  0-9 Units Subcutaneous Q4H Houston SirenPeter Le, MD   1 Units at 10/28/16 0815  . latanoprost (XALATAN) 0.005 % ophthalmic solution 1 drop  1 drop Both Eyes QHS Houston SirenPeter Le, MD   1 drop at 10/27/16 2032  . lisinopril (PRINIVIL,ZESTRIL) tablet 10 mg  10 mg Oral Daily Houston SirenPeter Le, MD   10 mg at 10/27/16 0917  . memantine (NAMENDA) tablet 10 mg  10 mg Oral BID Houston SirenPeter Le, MD   10 mg at  10/27/16 2027  . metFORMIN (GLUCOPHAGE) tablet 1,000 mg  1,000 mg Oral BID WC Houston Siren, MD   1,000 mg at 10/28/16 0815  . OLANZapine (ZYPREXA) tablet 10 mg  10 mg Oral QHS Houston Siren, MD   10 mg at 10/27/16 2027  . pantoprazole (PROTONIX) EC tablet 40 mg  40 mg Oral Daily Houston Siren, MD   40 mg at 10/27/16 0917  . senna (SENOKOT) tablet 8.6 mg  1 tablet Oral QHS Houston Siren, MD   8.6 mg at 10/27/16 2028  . simvastatin (ZOCOR) tablet 40 mg  40 mg Oral QPM Houston Siren, MD   40 mg at 10/27/16 1610  . sodium chloride flush (NS) 0.9 % injection 3 mL  3 mL Intravenous Q12H Houston Siren, MD   3 mL at 10/27/16 2033  . tamsulosin (FLOMAX) capsule 0.4 mg  0.4 mg Oral QPM Houston Siren, MD   0.4 mg at 10/27/16 1609      Discharge Medications: Medication List    TAKE these medications   acetaminophen 325 MG tablet Commonly known as:  TYLENOL Take 650 mg by mouth every 6 (six) hours as needed for mild pain or moderate pain.   bimatoprost 0.01 % Soln Commonly known as:  LUMIGAN Place 1 drop into both eyes at bedtime.   carbamazepine 200 MG tablet Commonly known as:  TEGRETOL Take 200 mg by mouth 3 (three) times daily.   clonazePAM 0.5 MG tablet Commonly known as:  KLONOPIN Take 0.5 mg by mouth 2 (two) times daily.   donepezil 10 MG tablet Commonly known as:  ARICEPT Take 10 mg by mouth at bedtime.   folic acid 1 MG tablet Commonly known as:  FOLVITE Take 1 mg by mouth daily.   insulin detemir 100 UNIT/ML injection Commonly known as:  LEVEMIR Inject 0.2 mLs (20 Units total) into the skin every morning.   lisinopril 10 MG tablet Commonly known as:  PRINIVIL,ZESTRIL Take 10 mg by mouth daily.   memantine 10 MG tablet Commonly known as:  NAMENDA Take 10 mg by mouth 2 (two) times daily.   metFORMIN 1000 MG tablet Commonly known as:  GLUCOPHAGE Take 1 tablet (1,000 mg total) by mouth 2 (two) times daily with a meal.   OLANZapine 10 MG tablet Commonly known as:  ZYPREXA Take 10 mg by mouth at bedtime.   omeprazole 20 MG capsule Commonly known as:  PRILOSEC Take 20 mg by mouth daily.   oxybutynin 5 MG tablet Commonly known as:  DITROPAN Take 5 mg by mouth 2 (two) times daily.   senna 8.6 MG Tabs tablet Commonly known as:  SENOKOT Take 1 tablet by mouth at bedtime.   simvastatin 40 MG tablet Commonly known as:  ZOCOR Take 40 mg by mouth every evening.   tamsulosin 0.4 MG Caps capsule Commonly known as:  FLOMAX Take 0.4 mg by mouth every evening.   Vitamin D (Ergocalciferol) 50000 units Caps capsule Commonly known as:  DRISDOL Take 1 capsule (50,000 Units total) by mouth every 7 (seven) days.        Relevant Imaging Results:  Relevant Lab  Results:   Additional Information Please pursue guardianship.   Derenda Fennel [place], [place] [phone]

## 2016-10-28 NOTE — Clinical Social Work Note (Signed)
Pt to d/c after CT. Facility will provide transport.   Derenda FennelKara Bravery Ketcham, LCSW [phone]

## 2016-10-30 ENCOUNTER — Other Ambulatory Visit: Payer: Self-pay | Admitting: "Endocrinology

## 2016-11-12 DIAGNOSIS — L851 Acquired keratosis [keratoderma] palmaris et plantaris: Secondary | ICD-10-CM | POA: Diagnosis not present

## 2016-11-12 DIAGNOSIS — E1342 Other specified diabetes mellitus with diabetic polyneuropathy: Secondary | ICD-10-CM | POA: Diagnosis not present

## 2016-11-12 DIAGNOSIS — B351 Tinea unguium: Secondary | ICD-10-CM | POA: Diagnosis not present

## 2016-11-20 ENCOUNTER — Other Ambulatory Visit: Payer: Self-pay | Admitting: "Endocrinology

## 2017-01-14 ENCOUNTER — Other Ambulatory Visit: Payer: Self-pay | Admitting: "Endocrinology

## 2017-01-14 DIAGNOSIS — Z794 Long term (current) use of insulin: Secondary | ICD-10-CM | POA: Diagnosis not present

## 2017-01-14 DIAGNOSIS — F250 Schizoaffective disorder, bipolar type: Secondary | ICD-10-CM | POA: Diagnosis not present

## 2017-01-14 DIAGNOSIS — E1165 Type 2 diabetes mellitus with hyperglycemia: Secondary | ICD-10-CM | POA: Diagnosis not present

## 2017-01-14 LAB — COMPREHENSIVE METABOLIC PANEL
ALT: 12 U/L (ref 9–46)
AST: 13 U/L (ref 10–35)
Albumin: 3.6 g/dL (ref 3.6–5.1)
Alkaline Phosphatase: 92 U/L (ref 40–115)
BUN: 16 mg/dL (ref 7–25)
CHLORIDE: 106 mmol/L (ref 98–110)
CO2: 30 mmol/L (ref 20–31)
CREATININE: 0.83 mg/dL (ref 0.70–1.18)
Calcium: 8.9 mg/dL (ref 8.6–10.3)
Glucose, Bld: 96 mg/dL (ref 65–99)
Potassium: 4.7 mmol/L (ref 3.5–5.3)
SODIUM: 142 mmol/L (ref 135–146)
Total Bilirubin: 0.3 mg/dL (ref 0.2–1.2)
Total Protein: 5.7 g/dL — ABNORMAL LOW (ref 6.1–8.1)

## 2017-01-15 LAB — HEMOGLOBIN A1C
Hgb A1c MFr Bld: 6.5 % — ABNORMAL HIGH (ref <5.7)
MEAN PLASMA GLUCOSE: 140 mg/dL

## 2017-01-21 DIAGNOSIS — B351 Tinea unguium: Secondary | ICD-10-CM | POA: Diagnosis not present

## 2017-01-21 DIAGNOSIS — E1342 Other specified diabetes mellitus with diabetic polyneuropathy: Secondary | ICD-10-CM | POA: Diagnosis not present

## 2017-01-21 DIAGNOSIS — L851 Acquired keratosis [keratoderma] palmaris et plantaris: Secondary | ICD-10-CM | POA: Diagnosis not present

## 2017-01-23 ENCOUNTER — Ambulatory Visit: Payer: Medicare Other | Admitting: "Endocrinology

## 2017-02-21 ENCOUNTER — Other Ambulatory Visit: Payer: Self-pay | Admitting: "Endocrinology

## 2017-03-02 ENCOUNTER — Emergency Department (HOSPITAL_COMMUNITY)
Admission: EM | Admit: 2017-03-02 | Discharge: 2017-03-02 | Disposition: A | Discharge status: Home / Self Care | Payer: Medicare Other | Attending: Emergency Medicine | Admitting: Emergency Medicine

## 2017-03-02 ENCOUNTER — Emergency Department (HOSPITAL_COMMUNITY): Payer: Medicare Other

## 2017-03-02 ENCOUNTER — Encounter (HOSPITAL_COMMUNITY): Payer: Self-pay | Admitting: Emergency Medicine

## 2017-03-02 DIAGNOSIS — Z743 Need for continuous supervision: Secondary | ICD-10-CM | POA: Diagnosis not present

## 2017-03-02 DIAGNOSIS — Z79899 Other long term (current) drug therapy: Secondary | ICD-10-CM | POA: Insufficient documentation

## 2017-03-02 DIAGNOSIS — I959 Hypotension, unspecified: Secondary | ICD-10-CM | POA: Diagnosis present

## 2017-03-02 DIAGNOSIS — R279 Unspecified lack of coordination: Secondary | ICD-10-CM | POA: Diagnosis not present

## 2017-03-02 DIAGNOSIS — J449 Chronic obstructive pulmonary disease, unspecified: Secondary | ICD-10-CM | POA: Diagnosis not present

## 2017-03-02 DIAGNOSIS — R0682 Tachypnea, not elsewhere classified: Secondary | ICD-10-CM | POA: Diagnosis not present

## 2017-03-02 DIAGNOSIS — R410 Disorientation, unspecified: Secondary | ICD-10-CM | POA: Diagnosis not present

## 2017-03-02 DIAGNOSIS — R0602 Shortness of breath: Secondary | ICD-10-CM | POA: Diagnosis not present

## 2017-03-02 DIAGNOSIS — Z7984 Long term (current) use of oral hypoglycemic drugs: Secondary | ICD-10-CM | POA: Insufficient documentation

## 2017-03-02 DIAGNOSIS — F17200 Nicotine dependence, unspecified, uncomplicated: Secondary | ICD-10-CM | POA: Diagnosis not present

## 2017-03-02 DIAGNOSIS — E119 Type 2 diabetes mellitus without complications: Secondary | ICD-10-CM | POA: Diagnosis not present

## 2017-03-02 DIAGNOSIS — E860 Dehydration: Secondary | ICD-10-CM | POA: Insufficient documentation

## 2017-03-02 DIAGNOSIS — Z794 Long term (current) use of insulin: Secondary | ICD-10-CM | POA: Diagnosis not present

## 2017-03-02 DIAGNOSIS — R4182 Altered mental status, unspecified: Secondary | ICD-10-CM | POA: Diagnosis not present

## 2017-03-02 LAB — CBC WITH DIFFERENTIAL/PLATELET
Basophils Absolute: 0 10*3/uL (ref 0.0–0.1)
Basophils Relative: 0 %
EOS ABS: 0.1 10*3/uL (ref 0.0–0.7)
Eosinophils Relative: 1 %
HCT: 36.6 % — ABNORMAL LOW (ref 39.0–52.0)
HEMOGLOBIN: 12.1 g/dL — AB (ref 13.0–17.0)
LYMPHS ABS: 2.2 10*3/uL (ref 0.7–4.0)
Lymphocytes Relative: 43 %
MCH: 35.7 pg — AB (ref 26.0–34.0)
MCHC: 33.1 g/dL (ref 30.0–36.0)
MCV: 108 fL — ABNORMAL HIGH (ref 78.0–100.0)
MONO ABS: 0.6 10*3/uL (ref 0.1–1.0)
MONOS PCT: 13 %
NEUTROS PCT: 43 %
Neutro Abs: 2.2 10*3/uL (ref 1.7–7.7)
Platelets: 204 10*3/uL (ref 150–400)
RBC: 3.39 MIL/uL — ABNORMAL LOW (ref 4.22–5.81)
RDW: 14.5 % (ref 11.5–15.5)
WBC: 5 10*3/uL (ref 4.0–10.5)

## 2017-03-02 LAB — URINALYSIS, ROUTINE W REFLEX MICROSCOPIC
BILIRUBIN URINE: NEGATIVE
Glucose, UA: 50 mg/dL — AB
HGB URINE DIPSTICK: NEGATIVE
Ketones, ur: NEGATIVE mg/dL
Leukocytes, UA: NEGATIVE
Nitrite: NEGATIVE
PH: 5 (ref 5.0–8.0)
Protein, ur: NEGATIVE mg/dL
SPECIFIC GRAVITY, URINE: 1.017 (ref 1.005–1.030)

## 2017-03-02 LAB — COMPREHENSIVE METABOLIC PANEL
ALBUMIN: 3.5 g/dL (ref 3.5–5.0)
ALT: 13 U/L — AB (ref 17–63)
AST: 19 U/L (ref 15–41)
Alkaline Phosphatase: 80 U/L (ref 38–126)
Anion gap: 10 (ref 5–15)
BUN: 23 mg/dL — AB (ref 6–20)
CHLORIDE: 104 mmol/L (ref 101–111)
CO2: 26 mmol/L (ref 22–32)
CREATININE: 1.1 mg/dL (ref 0.61–1.24)
Calcium: 8.6 mg/dL — ABNORMAL LOW (ref 8.9–10.3)
GFR calc non Af Amer: >60 mL/min (ref >60)
GLUCOSE: 133 mg/dL — AB (ref 65–99)
Potassium: 4.1 mmol/L (ref 3.5–5.1)
SODIUM: 140 mmol/L (ref 135–145)
Total Bilirubin: 0.3 mg/dL (ref 0.3–1.2)
Total Protein: 6.1 g/dL — ABNORMAL LOW (ref 6.5–8.1)

## 2017-03-02 LAB — I-STAT CG4 LACTIC ACID, ED
LACTIC ACID, VENOUS: 4.61 mmol/L — AB (ref 0.5–1.9)
LACTIC ACID, VENOUS: 3.34 mmol/L — AB (ref 0.5–1.9)
Lactic Acid, Venous: 2.34 mmol/L (ref 0.5–1.9)

## 2017-03-02 MED ORDER — SODIUM CHLORIDE 0.9 % IV BOLUS (SEPSIS)
2000.0000 mL | Freq: Once | INTRAVENOUS | Status: AC
Start: 2017-03-02 — End: 2017-03-02
  Administered 2017-03-02: 2000 mL via INTRAVENOUS

## 2017-03-02 MED ORDER — SODIUM CHLORIDE 0.9 % IV BOLUS (SEPSIS)
1000.0000 mL | Freq: Once | INTRAVENOUS | Status: AC
  Administered 2017-03-02: 1000 mL via INTRAVENOUS

## 2017-03-02 NOTE — ED Notes (Signed)
[facility], the pt needs to be transported back via EMS.

## 2017-03-02 NOTE — ED Triage Notes (Signed)
EMS called to group home for AMS and hypotension. Pt alert and oriented x 1 upon ED arrival. bp 102-59.

## 2017-03-02 NOTE — ED Notes (Signed)
[facility]ed [facility] and they stated they will be calling the administrator to set up transport back to the home.

## 2017-03-02 NOTE — Discharge Instructions (Signed)
Drink plenty of fluids and follow-up with her doctor next week for recheck °

## 2017-03-02 NOTE — ED Provider Notes (Signed)
[facility]-EMER[facility]Y DEPT Provider Note   CSN: [id number] Arrival date & time: 03/02/17  1302     History   Chief Complaint Chief Complaint  Patient presents with  . Hypotension    HPI Andre Rowe is a 76 y.o. male.  Patient stays in a group home. He was initially brought here could his blood pressure was low but low and we were told he was more confused than normal. Shortly after he was here a gentleman from the group home who knows him well states that his confusion is his normal.   The history is provided by a caregiver. No language interpreter was used.  Weakness  Primary symptoms include no focal weakness. This is a new problem. The problem has not changed since onset.There was no focality noted. There has been no fever. The fever has been present for less than 1 day. Pertinent negatives include no shortness of breath, no chest pain and no headaches. There were no medications administered prior to arrival. Associated medical issues do not include trauma.    Past Medical History:  Diagnosis Date  . BPH (benign prostatic hyperplasia)   . COPD (chronic obstructive pulmonary disease) (HCC)   . Diabetes mellitus, type II (HCC)   . Hyperlipidemia     There are no active problems to display for this patient.   History reviewed. No pertinent surgical history.     Home Medications    Prior to Admission medications   Medication Sig Start Date End Date Taking? Authorizing Provider  bimatoprost (LUMIGAN) 0.01 % SOLN 1 drop at bedtime.   Yes [provider]  carbamazepine (TEGRETOL) 200 MG tablet Take 200 mg by mouth 3 (three) times daily.   Yes [provider]  clonazePAM (KLONOPIN) 0.5 MG tablet Take 0.5 mg by mouth 3 (three) times daily as needed for anxiety.   Yes [provider]  donepezil (ARICEPT) 10 MG tablet Take 10 mg by mouth at bedtime.   Yes [provider]  folic acid (FOLVITE) 1 MG tablet Take 1 mg by mouth daily.   Yes  [provider]  insulin aspart (NOVOLOG FLEXPEN) 100 UNIT/ML FlexPen Inject 5 Units into the skin 3 (three) times daily with meals.   Yes [provider]  LEVEMIR 100 UNIT/ML injection INJECT 20 UNITS SUBCUTANEOUSLY EACH MORNING. 10/30/16  Yes [name], [name], MD  lisinopril (PRINIVIL,ZESTRIL) 10 MG tablet Take 10 mg by mouth daily.   Yes [provider]  memantine (NAMENDA XR) 28 MG CP24 24 hr capsule Take 28 mg by mouth daily.   Yes [provider]  metFORMIN (GLUCOPHAGE) 1000 MG tablet TAKE 1 TABLET BY MOUTH TWICE DAILY. 02/21/17  Yes [name], [name], MD  OLANZapine (ZYPREXA) 10 MG tablet Take 10 mg by mouth at bedtime.   Yes [provider]  omeprazole (PRILOSEC) 20 MG capsule Take 20 mg by mouth daily.   Yes [provider]  oxybutynin (DITROPAN) 5 MG tablet Take 5 mg by mouth 2 (two) times daily.   Yes [provider]  povidone-iodine (BETADINE) 10 % external solution Apply 1 application topically daily. Apply to skin between the 4th and 5th toes on both feet once daily   Yes [provider]  senna (SENOKOT) 8.6 MG tablet Take 1 tablet by mouth daily.   Yes [provider]  simvastatin (ZOCOR) 40 MG tablet Take 40 mg by mouth daily.   Yes [provider]  tamsulosin (FLOMAX) 0.4 MG CAPS capsule Take 0.4  mg by mouth daily.   Yes [provider]  Vitamin D, Ergocalciferol, (DRISDOL) 50000 units CAPS capsule TAKE 1 TABLET BY MOUTH ONCE WEEKLY. 02/21/17  Yes [name], [name], MD    Family History No family history on file.  Social History Social History  Substance Use Topics  . Smoking status: Current Every Day Smoker  . Smokeless tobacco: Never Used  . Alcohol use Yes     Allergies   Patient has no known allergies.   Review of Systems Review of Systems  Constitutional: Negative for appetite change and fatigue.  HENT: Negative for congestion, ear discharge and sinus  pressure.   Eyes: Negative for discharge.  Respiratory: Negative for cough and shortness of breath.   Cardiovascular: Negative for chest pain.  Gastrointestinal: Negative for abdominal pain and diarrhea.  Genitourinary: Negative for frequency and hematuria.  Musculoskeletal: Negative for back pain.  Skin: Negative for rash.  Neurological: Positive for weakness. Negative for focal weakness, seizures and headaches.  Psychiatric/Behavioral: Negative for hallucinations.     Physical Exam Updated Vital Signs BP (!) 151/63   Pulse 77   Temp 98.5 F (36.9 C)   Resp 19   Ht 5\' 10"  (1.778 m)   Wt 90.7 kg (200 lb)   SpO2 100%   BMI 28.70 kg/m   Physical Exam  Constitutional: He is oriented to person, place, and time. He appears well-developed.  HENT:  Head: Normocephalic.  Eyes: Conjunctivae and EOM are normal. No scleral icterus.  Neck: Neck supple. No thyromegaly present.  Cardiovascular: Normal rate and regular rhythm.  Exam reveals no gallop and no friction rub.   No murmur heard. Pulmonary/Chest: No stridor. He has no wheezes. He has no rales. He exhibits no tenderness.  Abdominal: He exhibits no distension. There is no tenderness. There is no rebound.  Musculoskeletal: Normal range of motion. He exhibits no edema.  Lymphadenopathy:    He has no cervical adenopathy.  Neurological: He is oriented to person, place, and time. He exhibits normal muscle tone. Coordination normal.  Skin: No rash noted. No erythema.  Psychiatric: He has a normal mood and affect. His behavior is normal.     ED Treatments / Results  Labs (all labs ordered are listed, but only abnormal results are displayed) Labs Reviewed  COMPREHENSIVE METABOLIC PANEL - Abnormal; Notable for the following:       Result Value   Glucose, Bld 133 (*)    BUN 23 (*)    Calcium 8.6 (*)    Total Protein 6.1 (*)    ALT 13 (*)    All other components within normal limits  CBC WITH DIFFERENTIAL/PLATELET - Abnormal;  Notable for the following:    RBC 3.39 (*)    Hemoglobin 12.1 (*)    HCT 36.6 (*)    MCV 108.0 (*)    MCH 35.7 (*)    All other components within normal limits  URINALYSIS, ROUTINE W REFLEX MICROSCOPIC - Abnormal; Notable for the following:    Glucose, UA 50 (*)    All other components within normal limits  I-STAT CG4 LACTIC ACID, ED - Abnormal; Notable for the following:    Lactic Acid, Venous 4.61 (*)    All other components within normal limits  I-STAT CG4 LACTIC ACID, ED - Abnormal; Notable for the following:    Lactic Acid, Venous 3.34 (*)    All other components within normal limits  I-STAT CG4 LACTIC ACID, ED - Abnormal; Notable for the following:  Lactic Acid, Venous 2.34 (*)    All other components within normal limits  CULTURE, BLOOD (ROUTINE X 2)  CULTURE, BLOOD (ROUTINE X 2)    EKG  EKG Interpretation None       Radiology Dg Chest 1 View  Result Date: 03/02/2017 CLINICAL DATA:  Shortness of breath. EXAM: CHEST 1 VIEW COMPARISON:  None. FINDINGS: Borderline enlarged cardiac silhouette. Tortuous and mildly calcified thoracic aorta. Tenting of the left hemidiaphragm. Clear lungs. Unremarkable bones. IMPRESSION: No acute abnormality. Electronically Signed   By: Beckie SaltsSteven  Reid M.D.   On: 03/02/2017 14:48   Ct Head Wo Contrast  Result Date: 03/02/2017 CLINICAL DATA:  Altered mental status.  Hypotension. EXAM: CT HEAD WITHOUT CONTRAST TECHNIQUE: Contiguous axial images were obtained from the base of the skull through the vertex without intravenous contrast. COMPARISON:  None. FINDINGS: Brain: Diffusely enlarged ventricles and subarachnoid spaces. Patchy white matter low density in both cerebral hemispheres. Small midline posterior interhemispheric lipoma. No intracranial hemorrhage, mass lesion or CT evidence of acute infarction. Vascular: No hyperdense vessel or unexpected calcification. Skull: Normal. Negative for fracture or focal lesion. 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 The included orbits are unremarkable. Other: None. IMPRESSION: 1. No acute abnormality. 2. Mild to moderate diffuse cerebral and cerebellar atrophy. 3. Mild chronic small vessel white matter ischemic changes in both cerebral hemispheres. Electronically Signed   By: Beckie SaltsSteven  Reid M.D.   On: 03/02/2017 15:12    Procedures Procedures (including critical care time)  Medications Ordered in ED Medications  sodium chloride 0.9 % bolus 2,000 mL (0 mLs Intravenous Stopped 03/02/17 1605)  sodium chloride 0.9 % bolus 1,000 mL (0 mLs Intravenous Stopped 03/02/17 1901)     Initial Impression / Assessment and Plan / ED Course  I have reviewed the triage vital signs and the nursing notes.  Pertinent labs & imaging results that were available during my care of the patient were reviewed by me and considered in my medical decision making (see chart for details).     Patient's blood pressure quickly normalized with IV fluids. Labs were unremarkable except for his lactic acid was mild to moderately elevated. After patient was adequately hydrated his lactic acid decreased significantly to almost the normal range. Diagnosis of hypotension secondary dehydration patient will follow-up with his PCP  Final Clinical Impressions(s) / ED Diagnoses   Final diagnoses:  Dehydration    New Prescriptions New Prescriptions   No medications on file     Bethann BerkshireZammit, Kymora Sciara, MD 03/02/17 1924

## 2017-03-02 NOTE — ED Notes (Signed)
Staff called from [facility] and stated they have left a message for administrators and awaiting a call back to call them back if we have not heard from them.

## 2017-03-02 NOTE — ED Notes (Signed)
EMS arrived for transport.

## 2017-03-04 ENCOUNTER — Ambulatory Visit (INDEPENDENT_AMBULATORY_CARE_PROVIDER_SITE_OTHER): Payer: Medicare Other | Admitting: "Endocrinology

## 2017-03-04 ENCOUNTER — Encounter: Payer: Self-pay | Admitting: "Endocrinology

## 2017-03-04 VITALS — BP 112/71 | HR 92 | Ht 68.0 in | Wt 204.0 lb

## 2017-03-04 DIAGNOSIS — E1165 Type 2 diabetes mellitus with hyperglycemia: Secondary | ICD-10-CM | POA: Diagnosis not present

## 2017-03-04 DIAGNOSIS — E782 Mixed hyperlipidemia: Secondary | ICD-10-CM

## 2017-03-04 DIAGNOSIS — IMO0001 Reserved for inherently not codable concepts without codable children: Secondary | ICD-10-CM

## 2017-03-04 DIAGNOSIS — I10 Essential (primary) hypertension: Secondary | ICD-10-CM

## 2017-03-04 DIAGNOSIS — Z794 Long term (current) use of insulin: Secondary | ICD-10-CM

## 2017-03-04 NOTE — Progress Notes (Signed)
Subjective:    Patient ID: Andre Rowe, male    DOB: Apr 04, 1941, PCP Kari Baars, MD   Past Medical History:  Diagnosis Date  . Alzheimer disease   . Dementia   . Diabetes mellitus without complication (HCC)   . GERD (gastroesophageal reflux disease)   . High cholesterol   . Hypertension   . Overactive bladder   . Schizophrenia [facility])    Past Surgical History:  Procedure Laterality Date  . unable     Social History   Social History  . Marital status: Single    Spouse name: N/A  . Number of children: N/A  . Years of education: N/A   Social History Main Topics  . Smoking status: Current Every Day Smoker  . Smokeless tobacco: Never Used  . Alcohol use No  . Drug use: No  . Sexual activity: Not Asked   Other Topics Concern  . None   Social History Narrative  . None   Outpatient Encounter Prescriptions as of 03/04/2017  Medication Sig  . acetaminophen (TYLENOL) 325 MG tablet Take 650 mg by mouth every 6 (six) hours as needed for mild pain or moderate pain.  . bimatoprost (LUMIGAN) 0.01 % SOLN Place 1 drop into both eyes at bedtime.  . carbamazepine (TEGRETOL) 200 MG tablet Take 200 mg by mouth 3 (three) times daily.  . clonazePAM (KLONOPIN) 0.5 MG tablet Take 0.5 mg by mouth 2 (two) times daily.   Marland Kitchen donepezil (ARICEPT) 10 MG tablet Take 10 mg by mouth at bedtime.  . folic acid (FOLVITE) 1 MG tablet Take 1 mg by mouth daily.  . insulin detemir (LEVEMIR) 100 UNIT/ML injection Inject 0.2 mLs (20 Units total) into the skin every morning.  Marland Kitchen lisinopril (PRINIVIL,ZESTRIL) 10 MG tablet Take 10 mg by mouth daily.  . memantine (NAMENDA) 10 MG tablet Take 10 mg by mouth 2 (two) times daily.  . metFORMIN (GLUCOPHAGE) 1000 MG tablet Take 1 tablet (1,000 mg total) by mouth 2 (two) times daily with a meal.  . OLANZapine (ZYPREXA) 10 MG tablet Take 10 mg by mouth at bedtime.  Marland Kitchen omeprazole (PRILOSEC) 20 MG capsule Take 20 mg by mouth daily.  Marland Kitchen oxybutynin (DITROPAN) 5 MG  tablet Take 5 mg by mouth 2 (two) times daily.  Marland Kitchen senna (SENOKOT) 8.6 MG TABS tablet Take 1 tablet by mouth at bedtime.  . simvastatin (ZOCOR) 40 MG tablet Take 40 mg by mouth every evening.  . tamsulosin (FLOMAX) 0.4 MG CAPS Take 0.4 mg by mouth every evening.   . Vitamin D, Ergocalciferol, (DRISDOL) 50000 units CAPS capsule Take 1 capsule (50,000 Units total) by mouth every 7 (seven) days.   No facility-administered encounter medications on file as of 03/04/2017.    ALLERGIES: No Known Allergies VACCINATION STATUS:  There is no immunization history on file for this patient.  Diabetes  He presents for his follow-up diabetic visit. He has type 2 diabetes mellitus. Onset time: he was diagnosed at age 39 yrs. His disease course has been stable. There are no hypoglycemic associated symptoms. Pertinent negatives for hypoglycemia include no confusion, headaches, pallor or seizures. There are no diabetic associated symptoms. Pertinent negatives for diabetes include no chest pain, no fatigue, no polydipsia, no polyphagia, no polyuria and no weakness. There are no hypoglycemic complications. Symptoms are stable. There are no diabetic complications. Risk factors for coronary artery disease include diabetes mellitus, dyslipidemia, hypertension, male sex, obesity, sedentary lifestyle and tobacco exposure. Current diabetic treatment includes insulin  injections and oral agent (monotherapy). He is compliant with treatment most of the time. His weight is stable. He is following a generally unhealthy diet. He never participates in exercise. His breakfast blood glucose range is generally 130-140 mg/dl. His dinner blood glucose range is generally 130-140 mg/dl. His overall blood glucose range is 130-140 mg/dl. An ACE inhibitor/angiotensin II receptor blocker is being taken.  Hyperlipidemia  This is a chronic problem. The current episode started more than 1 year ago. The problem is controlled. Exacerbating diseases  include diabetes and obesity. Pertinent negatives include no chest pain, myalgias or shortness of breath. Current antihyperlipidemic treatment includes statins. Risk factors for coronary artery disease include diabetes mellitus, dyslipidemia, hypertension, male sex, obesity and a sedentary lifestyle.  Hypertension  This is a chronic problem. The current episode started more than 1 year ago. The problem is controlled. Pertinent negatives include no chest pain, headaches, neck pain, palpitations or shortness of breath. Risk factors for coronary artery disease include diabetes mellitus, dyslipidemia, smoking/tobacco exposure and obesity. Past treatments include ACE inhibitors.     Review of Systems  Constitutional: Negative for fatigue and unexpected weight change.  HENT: Negative for dental problem, mouth sores and trouble swallowing.   Eyes: Negative for visual disturbance.  Respiratory: Negative for cough, choking, chest tightness, shortness of breath and wheezing.   Cardiovascular: Negative for chest pain, palpitations and leg swelling.  Gastrointestinal: Positive for constipation. Negative for abdominal distention, abdominal pain, diarrhea, nausea and vomiting.  Endocrine: Negative for polydipsia, polyphagia and polyuria.  Genitourinary: Negative for dysuria, flank pain, hematuria and urgency.  Musculoskeletal: Negative for back pain, gait problem, myalgias and neck pain.  Skin: Negative for pallor, rash and wound.  Neurological: Negative for seizures, syncope, weakness, numbness and headaches.  Psychiatric/Behavioral: Negative.  Negative for confusion and dysphoric mood.    Objective:    BP 112/71   Pulse 92   Ht 5\' 8"  (1.727 m)   Wt 204 lb (92.5 kg)   BMI 31.02 kg/m   Wt Readings from Last 3 Encounters:  03/04/17 204 lb (92.5 kg)  10/27/16 201 lb 4.5 oz (91.3 kg)  10/22/16 205 lb (93 kg)    Physical Exam  Constitutional: He is oriented to person, place, and time. He appears  well-developed and well-nourished. He is cooperative. No distress.  HENT:  Head: Normocephalic and atraumatic.  Eyes: EOM are normal.  Neck: Normal range of motion. Neck supple. No tracheal deviation present. No thyromegaly present.  Cardiovascular: Normal rate, S1 normal, S2 normal and normal heart sounds.  Exam reveals no gallop.   No murmur heard. Pulses:      Dorsalis pedis pulses are 1+ on the right side, and 1+ on the left side.       Posterior tibial pulses are 1+ on the right side, and 1+ on the left side.  Pulmonary/Chest: Breath sounds normal. No respiratory distress. He has no wheezes.  Abdominal: Soft. Bowel sounds are normal. He exhibits no distension. There is no tenderness. There is no guarding and no CVA tenderness.  Musculoskeletal: He exhibits no edema.       Right shoulder: He exhibits no swelling and no deformity.  Neurological: He is alert and oriented to person, place, and time. He has normal strength and normal reflexes. No cranial nerve deficit or sensory deficit. Gait normal.  Skin: Skin is warm and dry. No rash noted. No cyanosis. Nails show no clubbing.  Psychiatric: He has a normal mood and affect. His speech  is normal and behavior is normal. Judgment and thought content normal. Cognition and memory are normal.    Recent Results (from the past 2160 hour(s))  Comprehensive metabolic panel     Status: Abnormal   Collection Time: 01/14/17  9:52 AM  Result Value Ref Range   Sodium 142 135 - 146 mmol/L   Potassium 4.7 3.5 - 5.3 mmol/L   Chloride 106 98 - 110 mmol/L   CO2 30 20 - 31 mmol/L   Glucose, Bld 96 65 - 99 mg/dL   BUN 16 7 - 25 mg/dL   Creat 9.520.83 8.410.70 - 3.241.18 mg/dL    Comment:   For patients > or = 74 years of age: The upper reference limit for Creatinine is approximately 13% higher for people identified as African-American.      Total Bilirubin 0.3 0.2 - 1.2 mg/dL   Alkaline Phosphatase 92 40 - 115 U/L   AST 13 10 - 35 U/L   ALT 12 9 - 46 U/L    Total Protein 5.7 (L) 6.1 - 8.1 g/dL   Albumin 3.6 3.6 - 5.1 g/dL   Calcium 8.9 8.6 - 40.110.3 mg/dL  Hemoglobin U2VA1c     Status: Abnormal   Collection Time: 01/14/17  9:52 AM  Result Value Ref Range   Hgb A1c MFr Bld 6.5 (H) <5.7 %    Comment:   For someone without known diabetes, a hemoglobin A1c value of 6.5% or greater indicates that they may have diabetes and this should be confirmed with a follow-up test.   For someone with known diabetes, a value <7% indicates that their diabetes is well controlled and a value greater than or equal to 7% indicates suboptimal control. A1c targets should be individualized based on duration of diabetes, age, comorbid conditions, and other considerations.   Currently, no consensus exists for use of hemoglobin A1c for diagnosis of diabetes for children.      Mean Plasma Glucose 140 mg/dL     Assessment & Plan:   1. Uncontrolled type 2 diabetes mellitus without complication, with long-term current use of insulin (HCC)  - patient remains at a high risk for more acute and chronic complications of diabetes which include CAD, CVA, CKD, retinopathy, and neuropathy. These are all discussed in detail with the patient.  Patient came with improved  glucose profile, and  recent A1c of 6.5 %.  Glucose logs and insulin administration records pertaining to this visit,  to be scanned into patient's records.  Recent labs reviewed.   - I have re-counseled the patient on diet management and weight loss  by adopting a carbohydrate restricted / protein rich  Diet.  - Suggestion is made for patient to avoid simple carbohydrates   from his diet including Cakes , Desserts, Ice Cream,  Soda (  diet and regular) , Sweet Tea , Candies,  Chips, Cookies, Artificial Sweeteners,   and "Sugar-free" Products .  This will help patient to have stable blood glucose profile and potentially avoid unintended  Weight gain.  - Patient is advised to stick to a routine mealtimes to eat 3  meals  a day and avoid unnecessary snacks ( to snack only to correct hypoglycemia).  - I have approached patient with the following individualized plan to manage diabetes and patient agrees.  - Continue Levemir  20 units qam with breakfast. He will monitor BG BID. His care taker is encouraged to call clinic if he has blood glucose levels less than 70 or  above 300 mg /dl.  -I will continue metformin 1000mg  po BID.    - Patient specific target  for A1c; LDL, HDL, Triglycerides, and  Waist Circumference were discussed in detail.  2) BP/HTN:   Controlled . Continue current medications including ACEI/ARB. 3) Lipids/HPL:  continue statins. 4)  Weight/Diet: exercise, and carbohydrates information provided.  5) Chronic Care/Health Maintenance:  -Patient is  on ACEI/ARB and Statin medications and encouraged to continue to follow up with Ophthalmology, Podiatrist at least yearly or according to recommendations, and advised to quit  smoking. I have recommended yearly flu vaccine and pneumonia vaccination at least every 5 years; moderate intensity exercise for up to 150 minutes weekly; and  sleep for at least 7 hours a day. .   - I advised patient to maintain close follow up with Kari Baars, MD for primary care needs.  Patient is asked to bring meter and  blood glucose logs during his next visit.   Follow up plan: -Return in about 3 months (around 06/04/2017) for follow up with pre-visit labs, meter, and logs.  Marquis Lunch, MD Phone: [phone]  Fax: [phone]   03/04/2017, 2:17 PM

## 2017-03-07 LAB — CULTURE, BLOOD (ROUTINE X 2)
Culture: NO GROWTH
Culture: NO GROWTH

## 2017-04-01 DIAGNOSIS — E1342 Other specified diabetes mellitus with diabetic polyneuropathy: Secondary | ICD-10-CM | POA: Diagnosis not present

## 2017-04-01 DIAGNOSIS — B351 Tinea unguium: Secondary | ICD-10-CM | POA: Diagnosis not present

## 2017-04-01 DIAGNOSIS — L851 Acquired keratosis [keratoderma] palmaris et plantaris: Secondary | ICD-10-CM | POA: Diagnosis not present

## 2017-04-14 ENCOUNTER — Other Ambulatory Visit: Payer: Self-pay | Admitting: "Endocrinology

## 2017-04-22 ENCOUNTER — Emergency Department (HOSPITAL_COMMUNITY)
Admission: EM | Admit: 2017-04-22 | Discharge: 2017-04-23 | Disposition: A | Discharge status: Home / Self Care | Payer: Medicare Other | Attending: Emergency Medicine | Admitting: Emergency Medicine

## 2017-04-22 ENCOUNTER — Emergency Department (HOSPITAL_COMMUNITY): Payer: Medicare Other

## 2017-04-22 ENCOUNTER — Encounter (HOSPITAL_COMMUNITY): Payer: Self-pay | Admitting: Emergency Medicine

## 2017-04-22 DIAGNOSIS — G309 Alzheimer's disease, unspecified: Secondary | ICD-10-CM | POA: Insufficient documentation

## 2017-04-22 DIAGNOSIS — E119 Type 2 diabetes mellitus without complications: Secondary | ICD-10-CM | POA: Diagnosis not present

## 2017-04-22 DIAGNOSIS — R0602 Shortness of breath: Secondary | ICD-10-CM | POA: Diagnosis not present

## 2017-04-22 DIAGNOSIS — F17210 Nicotine dependence, cigarettes, uncomplicated: Secondary | ICD-10-CM | POA: Diagnosis not present

## 2017-04-22 DIAGNOSIS — I10 Essential (primary) hypertension: Secondary | ICD-10-CM | POA: Diagnosis not present

## 2017-04-22 DIAGNOSIS — R079 Chest pain, unspecified: Secondary | ICD-10-CM | POA: Insufficient documentation

## 2017-04-22 LAB — TROPONIN I

## 2017-04-22 LAB — BASIC METABOLIC PANEL
ANION GAP: 10 (ref 5–15)
BUN: 18 mg/dL (ref 6–20)
CALCIUM: 8.7 mg/dL — AB (ref 8.9–10.3)
CHLORIDE: 103 mmol/L (ref 101–111)
CO2: 26 mmol/L (ref 22–32)
CREATININE: 0.91 mg/dL (ref 0.61–1.24)
GFR calc non Af Amer: >60 mL/min (ref >60)
Glucose, Bld: 93 mg/dL (ref 65–99)
Potassium: 4.2 mmol/L (ref 3.5–5.1)
SODIUM: 139 mmol/L (ref 135–145)

## 2017-04-22 LAB — CBC
HEMATOCRIT: 37.8 % — AB (ref 39.0–52.0)
Hemoglobin: 12.5 g/dL — ABNORMAL LOW (ref 13.0–17.0)
MCH: 35.7 pg — ABNORMAL HIGH (ref 26.0–34.0)
MCHC: 33.1 g/dL (ref 30.0–36.0)
MCV: 108 fL — ABNORMAL HIGH (ref 78.0–100.0)
Platelets: 209 10*3/uL (ref 150–400)
RBC: 3.5 MIL/uL — AB (ref 4.22–5.81)
RDW: 14.8 % (ref 11.5–15.5)
WBC: 5.9 10*3/uL (ref 4.0–10.5)

## 2017-04-22 LAB — BRAIN NATRIURETIC PEPTIDE: B Natriuretic Peptide: 9 pg/mL (ref 0.0–100.0)

## 2017-04-22 NOTE — ED Provider Notes (Signed)
Emergency Department Provider Note   I have reviewed the triage vital signs and the nursing notes.   HISTORY  Chief Complaint Chest Pain   HPI Andre Rowe is a 76 y.o. male with a history of dementia, diabetes, reflux, hypertensionwho presents to the emergency room today for questionable chest pain. The caregiver with him states that he had chest pain earlier however patient does not remember this. She states that he had on the way over here but he states he has no pain now. He had any shortness of breath. He has had some cough but nothing consistent. No other illnesses. No recent sick contacts. The caregiver also noticed increased swelling to bilateral lower extremities are brought him here for further evaluation. Difficult to obtain review of systems her further history as the patient is not a good historian.   Past Medical History:  Diagnosis Date  . Alzheimer disease   . Dementia   . Diabetes mellitus without complication (HCC)   . GERD (gastroesophageal reflux disease)   . High cholesterol   . Hypertension   . Overactive bladder   . Schizophrenia [facility](HCC)     Patient Active Problem List   Diagnosis Date Noted  . SDH (subdural hematoma) (HCC) 10/26/2016  . Subdural hematoma, acute (HCC) 10/25/2016  . Subdural hematoma (HCC) 10/25/2016  . Vitamin D deficiency 12/08/2015  . Hyperlipidemia 08/02/2015  . Cellulitis of leg, left 12/11/2014  . Type 2 diabetes mellitus, uncontrolled (HCC) 12/08/2014  . Dementia 12/08/2014  . Schizophrenia (HCC) 12/08/2014  . Hypertension 12/08/2014  . HCAP (healthcare-associated pneumonia) 12/08/2014    Past Surgical History:  Procedure Laterality Date  . unable      Current Outpatient Rx  . Order #: [id number] Class: Historical Med  . Order #: [id number] Class: Historical Med  . Order #: [id number] Class: Historical Med  . Order #: [id number] Class: Historical Med  . Order #: [id number] Class: Historical Med  . Order #: [id number] Class:  Historical Med  . Order #: [id number] Class: Normal  . Order #: [id number] Class: Historical Med  . Order #: [id number] Class: Historical Med  . Order #: [id number] Class: Normal  . Order #: [id number] Class: Historical Med  . Order #: [id number] Class: Historical Med  . Order #: [id number] Class: Historical Med  . Order #: [id number] Class: Historical Med  . Order #: [id number] Class: Historical Med  . Order #: [id number] Class: Historical Med  . Order #: [id number] Class: Normal  . Order #: [id number] Class: Print    Allergies Patient has no known allergies.  Family History  Problem Relation Age of Onset  . Family history unknown: Yes    Social History Social History  Substance Use Topics  . Smoking status: Current Every Day Smoker    Types: Cigarettes  . Smokeless tobacco: Never Used  . Alcohol use No    Review of Systems  Level V caveat 2/2 poor historian, not able to participate in history ____________________________________________  PHYSICAL EXAM:  VITAL SIGNS: ED Triage Vitals  Enc Vitals Group     BP 04/22/17 2120 (!) 142/93     Pulse Rate 04/22/17 2120 93     Resp 04/22/17 2120 18     Temp 04/22/17 2120 (!) 97.4 F (36.3 C)     Temp Source 04/22/17 2120 Temporal     SpO2 04/22/17 2120 97 %     Weight 04/22/17 2119 205 lb (93 kg)     Height 04/22/17 2119 5\' 8"  (1.727 m)     Head Circumference --      Peak Flow --  Pain Score --      Pain Loc --      Pain Edu? --      Excl. in GC? --     Constitutional: Alert and oriented. Well appearing and in no acute distress. Eyes: Conjunctivae are normal. PERRL. EOMI. Head: Atraumatic. Nose: No congestion/rhinnorhea. Mouth/Throat: Mucous membranes are moist.  Oropharynx non-erythematous. Neck: No stridor.  No meningeal signs.   Cardiovascular: Normal rate, regular rhythm. Good peripheral circulation. Grossly normal heart sounds.   Respiratory: Normal respiratory effort.  No retractions. Lungs CTAB. Gastrointestinal: Soft and  nontender. No distention.  Musculoskeletal: No lower extremity tenderness nor edema. No gross deformities of extremities. Neurologic:  Normal speech and language. No gross focal neurologic deficits are appreciated.  Skin:  Skin is warm, dry and intact. No rash noted.  ____________________________________________   LABS (all labs ordered are listed, but only abnormal results are displayed)  Labs Reviewed  BASIC METABOLIC PANEL - Abnormal; Notable for the following:       Result Value   Calcium 8.7 (*)    All other components within normal limits  CBC - Abnormal; Notable for the following:    RBC 3.50 (*)    Hemoglobin 12.5 (*)    HCT 37.8 (*)    MCV 108.0 (*)    MCH 35.7 (*)    All other components within normal limits  TROPONIN I  BRAIN NATRIURETIC PEPTIDE  TROPONIN I   ____________________________________________  EKG   EKG Interpretation  Date/Time:  Tuesday April 22 2017 21:18:36 EDT Ventricular Rate:  91 PR Interval:  172 QRS Duration: 92 QT Interval:  354 QTC Calculation: 435 R Axis:   -43 Text Interpretation:  Normal sinus rhythm Left axis deviation Abnormal ECG No significant change since last tracing Confirmed by Marily Memos [phone]) on 04/23/2017 12:11:58 AM      ____________________________________________  RADIOLOGY  Dg Chest 2 View  Result Date: 04/22/2017 CLINICAL DATA:  Chest pain. History of dementia. History of hypertension, diabetes, current smoker. EXAM: CHEST  2 VIEW COMPARISON:  12/08/2014 FINDINGS: Shallow inspiration. Linear atelectasis or infiltration in both lung bases, can't exclude pneumonia. No blunting of costophrenic angles. No pneumothorax. Heart size and pulmonary vascularity are normal. Calcification of the aorta. Old bilateral rib fractures. IMPRESSION: Shallow inspiration with atelectasis or infiltration in both lung bases. Electronically Signed   By: Burman Nieves M.D.   On: 04/22/2017 21:53    ____________________________________________   PROCEDURES  Procedure(s) performed:   Procedures ____________________________________________   INITIAL IMPRESSION / ASSESSMENT AND PLAN / ED COURSE  Pertinent labs & imaging results that were available during my care of the patient were reviewed by me and considered in my medical decision making (see chart for details).  Poor historin, but is currently chest pain-free. As his questionable cough the past and possible passage of the x-rays will treat for pneumonia. However it does spread in native patient is likely stable for discharge home. BNP is normal making any type of heart failure unlikely.  ____________________________________________  FINAL CLINICAL IMPRESSION(S) / ED DIAGNOSES  Final diagnoses:  Nonspecific chest pain    MEDICATIONS GIVEN DURING THIS VISIT:  Medications - No data to display  NEW OUTPATIENT MEDICATIONS STARTED DURING THIS VISIT:  New Prescriptions   AZITHROMYCIN (ZITHROMAX) 250 MG TABLET    Take 1 tablet (250 mg total) by mouth daily. Take first 2 tablets together, then 1 every day until finished.    Note:  This document was prepared using Dragon voice  recognition software and may include unintentional dictation errors.    Verlin Duke, Barbara Cower, MD 04/23/17 [phone]

## 2017-04-22 NOTE — ED Triage Notes (Signed)
Patient brought in for chest pain by administrative coordinator at facility he is from[facility]. Patient denies any chest pain or any complaints at this time. Patient rambling, which staff states is his normal. Patient c/o pain in feet per staff and has notable swelling in lower extremities. Administrative coordinator states "he complained about pain in his chest on the way over here.

## 2017-04-23 ENCOUNTER — Encounter (HOSPITAL_COMMUNITY): Payer: Self-pay | Admitting: Emergency Medicine

## 2017-04-23 DIAGNOSIS — R079 Chest pain, unspecified: Secondary | ICD-10-CM | POA: Diagnosis not present

## 2017-04-23 LAB — TROPONIN I: Troponin I: <0.03 ng/mL (ref <0.03)

## 2017-04-23 MED ORDER — AZITHROMYCIN 250 MG PO TABS: 250.0000 mg | ORAL_TABLET | Freq: Every day | ORAL | 0 refills | Status: DC

## 2017-04-23 NOTE — ED Notes (Signed)
Pt wheeled to waiting room. Pt verbalized understanding of discharge instructions.   

## 2017-05-07 DIAGNOSIS — I10 Essential (primary) hypertension: Secondary | ICD-10-CM | POA: Diagnosis not present

## 2017-05-07 DIAGNOSIS — E1165 Type 2 diabetes mellitus with hyperglycemia: Secondary | ICD-10-CM | POA: Diagnosis not present

## 2017-05-07 DIAGNOSIS — J449 Chronic obstructive pulmonary disease, unspecified: Secondary | ICD-10-CM | POA: Diagnosis not present

## 2017-05-07 DIAGNOSIS — B353 Tinea pedis: Secondary | ICD-10-CM | POA: Diagnosis not present

## 2017-05-20 ENCOUNTER — Other Ambulatory Visit: Payer: Self-pay | Admitting: "Endocrinology

## 2017-05-26 ENCOUNTER — Other Ambulatory Visit: Payer: Self-pay | Admitting: "Endocrinology

## 2017-06-05 DIAGNOSIS — E1165 Type 2 diabetes mellitus with hyperglycemia: Secondary | ICD-10-CM | POA: Diagnosis not present

## 2017-06-05 DIAGNOSIS — Z794 Long term (current) use of insulin: Secondary | ICD-10-CM | POA: Diagnosis not present

## 2017-06-06 LAB — RENAL FUNCTION PANEL
ALBUMIN MSPROF: 3.7 g/dL (ref 3.6–5.1)
BUN: 19 mg/dL (ref 7–25)
CO2: 32 mmol/L (ref 20–32)
CREATININE: 0.98 mg/dL (ref 0.70–1.18)
Calcium: 9.2 mg/dL (ref 8.6–10.3)
Chloride: 102 mmol/L (ref 98–110)
GLUCOSE: 135 mg/dL — AB (ref 65–99)
POTASSIUM: 4.9 mmol/L (ref 3.5–5.3)
Phosphorus: 3.9 mg/dL (ref 2.1–4.3)
SODIUM: 140 mmol/L (ref 135–146)

## 2017-06-06 LAB — HEMOGLOBIN A1C
EAG (MMOL/L): 8.4 (calc)
Hgb A1c MFr Bld: 6.9 % of total Hgb — ABNORMAL HIGH (ref <5.7)
Mean Plasma Glucose: 151 (calc)

## 2017-06-09 ENCOUNTER — Encounter (HOSPITAL_COMMUNITY): Payer: Self-pay | Admitting: Emergency Medicine

## 2017-06-09 ENCOUNTER — Emergency Department (HOSPITAL_COMMUNITY)
Admission: EM | Admit: 2017-06-09 | Discharge: 2017-06-09 | Disposition: A | Discharge status: Home / Self Care | Payer: Medicare Other | Attending: Emergency Medicine | Admitting: Emergency Medicine

## 2017-06-09 ENCOUNTER — Emergency Department (HOSPITAL_COMMUNITY): Payer: Medicare Other

## 2017-06-09 DIAGNOSIS — R4182 Altered mental status, unspecified: Secondary | ICD-10-CM | POA: Diagnosis not present

## 2017-06-09 DIAGNOSIS — Y939 Activity, unspecified: Secondary | ICD-10-CM | POA: Insufficient documentation

## 2017-06-09 DIAGNOSIS — E119 Type 2 diabetes mellitus without complications: Secondary | ICD-10-CM | POA: Insufficient documentation

## 2017-06-09 DIAGNOSIS — F0390 Unspecified dementia without behavioral disturbance: Secondary | ICD-10-CM | POA: Insufficient documentation

## 2017-06-09 DIAGNOSIS — Z794 Long term (current) use of insulin: Secondary | ICD-10-CM | POA: Diagnosis not present

## 2017-06-09 DIAGNOSIS — S199XXA Unspecified injury of neck, initial encounter: Secondary | ICD-10-CM | POA: Diagnosis not present

## 2017-06-09 DIAGNOSIS — T148XXA Other injury of unspecified body region, initial encounter: Secondary | ICD-10-CM | POA: Diagnosis not present

## 2017-06-09 DIAGNOSIS — W19XXXA Unspecified fall, initial encounter: Secondary | ICD-10-CM

## 2017-06-09 DIAGNOSIS — F17210 Nicotine dependence, cigarettes, uncomplicated: Secondary | ICD-10-CM | POA: Diagnosis not present

## 2017-06-09 DIAGNOSIS — Z79899 Other long term (current) drug therapy: Secondary | ICD-10-CM | POA: Insufficient documentation

## 2017-06-09 DIAGNOSIS — W0110XA Fall on same level from slipping, tripping and stumbling with subsequent striking against unspecified object, initial encounter: Secondary | ICD-10-CM | POA: Diagnosis not present

## 2017-06-09 DIAGNOSIS — J449 Chronic obstructive pulmonary disease, unspecified: Secondary | ICD-10-CM | POA: Insufficient documentation

## 2017-06-09 DIAGNOSIS — Y9210 Unspecified residential institution as the place of occurrence of the external cause: Secondary | ICD-10-CM | POA: Insufficient documentation

## 2017-06-09 DIAGNOSIS — Y999 Unspecified external cause status: Secondary | ICD-10-CM | POA: Diagnosis not present

## 2017-06-09 DIAGNOSIS — S0990XA Unspecified injury of head, initial encounter: Secondary | ICD-10-CM | POA: Insufficient documentation

## 2017-06-09 DIAGNOSIS — T2620XA Burn with resulting rupture and destruction of unspecified eyeball, initial encounter: Secondary | ICD-10-CM | POA: Diagnosis not present

## 2017-06-09 NOTE — ED Notes (Signed)
Pt ambulated from bed to chair with no complaints of pain.

## 2017-06-09 NOTE — ED Provider Notes (Signed)
[facility] EMERGENCY DEPARTMENT Provider Note   CSN: [id number] Arrival date & time: 06/09/17  [id number]     History   Chief Complaint Chief Complaint  Patient presents with  . Fall    HPI Andre Rowe is a 76 y.o. male with a history of DM, COPD, HTN and dementia presenting for evaluation of a witness fall occurring at his family care home.  He hit the left side of his head during the fall, had no loc and has no complaint currently of any pain.  He has had no treatment prior to arrival.  Per EMS, he seemed a little lethargic during his transport.  The history is provided by the nursing home and the EMS personnel. The history is limited by the condition of the patient.    Past Medical History:  Diagnosis Date  . Alzheimer disease   . BPH (benign prostatic hyperplasia)   . COPD (chronic obstructive pulmonary disease) (HCC)   . Dementia   . Diabetes mellitus without complication (HCC)   . Diabetes mellitus, type II (HCC)   . GERD (gastroesophageal reflux disease)   . High cholesterol   . Hyperlipidemia   . Hypertension   . Overactive bladder   . Schizophrenia [facility](HCC)     Patient Active Problem List   Diagnosis Date Noted  . SDH (subdural hematoma) (HCC) 10/26/2016  . Subdural hematoma, acute (HCC) 10/25/2016  . Subdural hematoma (HCC) 10/25/2016  . Vitamin D deficiency 12/08/2015  . Hyperlipidemia 08/02/2015  . Cellulitis of leg, left 12/11/2014  . Type 2 diabetes mellitus, uncontrolled (HCC) 12/08/2014  . Dementia 12/08/2014  . Schizophrenia (HCC) 12/08/2014  . Hypertension 12/08/2014  . HCAP (healthcare-associated pneumonia) 12/08/2014    Past Surgical History:  Procedure Laterality Date  . unable         Home Medications    Prior to Admission medications   Medication Sig Start Date End Date Taking? Authorizing Provider  acetaminophen (TYLENOL) 325 MG tablet Take 650 mg by mouth every 6 (six) hours as needed for mild pain or moderate pain.   Yes  [provider]  bimatoprost (LUMIGAN) 0.01 % SOLN Place 1 drop into both eyes at bedtime.   Yes [provider]  carbamazepine (TEGRETOL) 200 MG tablet Take 200 mg by mouth 3 (three) times daily.   Yes [provider]  clonazePAM (KLONOPIN) 0.5 MG tablet Take 0.5 mg by mouth 2 (two) times daily.    Yes [provider]  donepezil (ARICEPT) 10 MG tablet Take 10 mg by mouth at bedtime.   Yes [provider]  folic acid (FOLVITE) 1 MG tablet Take 1 mg by mouth daily.   Yes [provider]  LEVEMIR 100 UNIT/ML injection INJECT 20 UNITS SUBCUTANEOUSLY EACH MORNING. 10/30/16  Yes [name], Denman GeorgeGebreselassie W, MD  lisinopril (PRINIVIL,ZESTRIL) 10 MG tablet Take 10 mg by mouth daily.   Yes [provider]  memantine (NAMENDA) 10 MG tablet Take 10 mg by mouth 2 (two) times daily.   Yes [provider]  metFORMIN (GLUCOPHAGE) 1000 MG tablet TAKE 1 TABLET BY MOUTH TWICE DAILY. 02/21/17  Yes [name], Denman GeorgeGebreselassie W, MD  OLANZapine (ZYPREXA) 10 MG tablet Take 10 mg by mouth at bedtime.   Yes [provider]  omeprazole (PRILOSEC) 20 MG capsule Take 20 mg by mouth daily.   Yes [provider]  oxybutynin (DITROPAN) 5 MG tablet Take 5 mg by mouth 2 (two) times daily.   Yes [provider]  povidone-iodine (  BETADINE) 10 % external solution Apply 1 application topically daily. Apply to skin between the 4th and 5th toes on both feet once daily   Yes [provider]  senna (SENOKOT) 8.6 MG TABS tablet Take 1 tablet by mouth at bedtime.   Yes [provider]  simvastatin (ZOCOR) 40 MG tablet Take 40 mg by mouth daily.   Yes [provider]  tamsulosin (FLOMAX) 0.4 MG CAPS capsule Take 0.4 mg by mouth daily.   Yes [provider]  Vitamin D, Ergocalciferol, (DRISDOL) 50000 units CAPS capsule TAKE 1 TABLET BY MOUTH ONCE WEEKLY. 05/27/17  Yes [name], [name], MD  azithromycin (ZITHROMAX) 250 MG  tablet Take 1 tablet (250 mg total) by mouth daily. Take first 2 tablets together, then 1 every day until finished. Patient not taking: Reported on 06/09/2017 04/23/17   Mesner, Barbara Cower, MD  insulin aspart (NOVOLOG FLEXPEN) 100 UNIT/ML FlexPen Inject 5 Units into the skin 3 (three) times daily with meals.    [provider]  LEVEMIR 100 UNIT/ML injection INJECT 20 UNITS SUBCUTANEOUSLY EACH MORNING. 04/14/17   [name], MD  LEVEMIR 100 UNIT/ML injection INJECT 20 UNITS SUBCUTANEOUSLY EACH MORNING. 05/20/17   [name], MD  metFORMIN (GLUCOPHAGE) 1000 MG tablet Take 1 tablet (1,000 mg total) by mouth 2 (two) times daily with a meal. 02/22/16   [name], [name], MD  Vitamin D, Ergocalciferol, (DRISDOL) 50000 units CAPS capsule Take 1 capsule (50,000 Units total) by mouth every 7 (seven) days. 02/22/16   [name], MD  Vitamin D, Ergocalciferol, (DRISDOL) 50000 units CAPS capsule TAKE 1 TABLET BY MOUTH ONCE WEEKLY. 02/21/17   [name], MD    Family History Family History  Problem Relation Age of Onset  . Family history unknown: Yes    Social History Social History  Substance Use Topics  . Smoking status: Current Every Day Smoker    Packs/day: 0.50    Types: Cigarettes  . Smokeless tobacco: Never Used  . Alcohol use No     Allergies   Patient has no known allergies.   Review of Systems Review of Systems  Unable to perform ROS: Dementia     Physical Exam Updated Vital Signs BP (!) 142/91   Pulse 84   Temp 98.2 F (36.8 C)   Resp 18   Ht 6\' 2"  (1.88 m)   Wt 95.3 kg (210 lb)   SpO2 100%   BMI 26.96 kg/m   Physical Exam  Constitutional: He appears well-developed and well-nourished. No distress.  Pleasantly demented patient in no acute distress.  HENT:  Head: Normocephalic and atraumatic.  Mouth/Throat: Oropharynx is clear and moist.  Eyes: Conjunctivae are normal.  Neck: Normal range of motion. Neck supple.    Cardiovascular: Normal rate, regular rhythm, normal heart sounds and intact distal pulses.   Pulmonary/Chest: Effort normal and breath sounds normal. He has no wheezes.  Abdominal: Soft. Bowel sounds are normal. There is no tenderness.  Musculoskeletal: Normal range of motion.  Neurological: He is alert. He has normal strength. No cranial nerve deficit.  Moves all extremities without deficit, pain or neglect.  Skin: Skin is warm and dry.  Psychiatric: He has a normal mood and affect.  Nursing note and vitals reviewed.    ED Treatments / Results  Labs (all labs ordered are listed, but only abnormal results are displayed) Labs Reviewed - No data to display  EKG  EKG Interpretation None  Radiology Ct Head Wo Contrast  Result Date: 06/09/2017 CLINICAL DATA:  Fall, hit left side of head EXAM: CT HEAD WITHOUT CONTRAST CT CERVICAL SPINE WITHOUT CONTRAST TECHNIQUE: Multidetector CT imaging of the head and cervical spine was performed following the standard protocol without intravenous contrast. Multiplanar CT image reconstructions of the cervical spine were also generated. COMPARISON:  10/28/2016 FINDINGS: CT HEAD FINDINGS Brain: There is atrophy and chronic small vessel disease changes. No acute intracranial abnormality. Specifically, no hemorrhage, hydrocephalus, mass lesion, acute infarction, or significant intracranial injury. Vascular: No hyperdense vessel or unexpected calcification. Skull: No acute calvarial abnormality. Sinuses/Orbits: Visualized paranasal sinuses and mastoids clear. Orbital soft tissues unremarkable. Old medial orbital wall blowout fracture, stable. Other: None CT CERVICAL SPINE FINDINGS Alignment: No subluxation. Skull base and vertebrae: No fracture. Soft tissues and spinal canal: Prevertebral soft tissues are normal. No epidural or paraspinal hematoma. Disc levels: Diffuse degenerative disc and facet disease throughout the cervical spine with disc space  narrowing and spurring. Upper chest: Negative Other: None IMPRESSION: No acute intracranial abnormality.Atrophy, chronic microvascular disease. Diffuse degenerative disc and facet disease. No acute bony abnormality. Electronically Signed   By: Charlett Nose M.D.   On: 06/09/2017 11:28   Ct Cervical Spine Wo Contrast  Result Date: 06/09/2017 CLINICAL DATA:  Fall, hit left side of head EXAM: CT HEAD WITHOUT CONTRAST CT CERVICAL SPINE WITHOUT CONTRAST TECHNIQUE: Multidetector CT imaging of the head and cervical spine was performed following the standard protocol without intravenous contrast. Multiplanar CT image reconstructions of the cervical spine were also generated. COMPARISON:  10/28/2016 FINDINGS: CT HEAD FINDINGS Brain: There is atrophy and chronic small vessel disease changes. No acute intracranial abnormality. Specifically, no hemorrhage, hydrocephalus, mass lesion, acute infarction, or significant intracranial injury. Vascular: No hyperdense vessel or unexpected calcification. Skull: No acute calvarial abnormality. Sinuses/Orbits: Visualized paranasal sinuses and mastoids clear. Orbital soft tissues unremarkable. Old medial orbital wall blowout fracture, stable. Other: None CT CERVICAL SPINE FINDINGS Alignment: No subluxation. Skull base and vertebrae: No fracture. Soft tissues and spinal canal: Prevertebral soft tissues are normal. No epidural or paraspinal hematoma. Disc levels: Diffuse degenerative disc and facet disease throughout the cervical spine with disc space narrowing and spurring. Upper chest: Negative Other: None IMPRESSION: No acute intracranial abnormality.Atrophy, chronic microvascular disease. Diffuse degenerative disc and facet disease. No acute bony abnormality. Electronically Signed   By: Charlett Nose M.D.   On: 06/09/2017 11:28    Procedures Procedures (including critical care time)  Medications Ordered in ED Medications - No data to display   Initial Impression / Assessment  and Plan / ED Course  I have reviewed the triage vital signs and the nursing notes.  Pertinent labs & imaging results that were available during my care of the patient were reviewed by me and considered in my medical decision making (see chart for details).     Imaging reviewed. Pt ambulated in dept.  Prn f/u anticipated.  Minor head injury instructions.  He will return to nursing facility.  Final Clinical Impressions(s) / ED Diagnoses   Final diagnoses:  Fall, initial encounter  Minor head injury, initial encounter    New Prescriptions New Prescriptions   No medications on file     Victoriano Lain 06/09/17 1159    Bethann Berkshire, MD 06/09/17 [phone]

## 2017-06-09 NOTE — ED Triage Notes (Signed)
PT arrived from [facility] via EMS due to a witness trip and fall by another resident and stated that he hit the left side of his head. PT has dementia. PT denies any pain upon arrival. EMS reports was a little lethargic upon arrival on scene but is back to his normal when he arrived to ED.

## 2017-06-09 NOTE — ED Notes (Signed)
Report given to Mr. Daphine DeutscherMartin, [facility].  Facility will send someone to pick up pt.

## 2017-06-10 DIAGNOSIS — E1342 Other specified diabetes mellitus with diabetic polyneuropathy: Secondary | ICD-10-CM | POA: Diagnosis not present

## 2017-06-10 DIAGNOSIS — B351 Tinea unguium: Secondary | ICD-10-CM | POA: Diagnosis not present

## 2017-06-10 DIAGNOSIS — L851 Acquired keratosis [keratoderma] palmaris et plantaris: Secondary | ICD-10-CM | POA: Diagnosis not present

## 2017-06-11 ENCOUNTER — Ambulatory Visit (INDEPENDENT_AMBULATORY_CARE_PROVIDER_SITE_OTHER): Payer: Medicare Other | Admitting: "Endocrinology

## 2017-06-11 ENCOUNTER — Encounter: Payer: Self-pay | Admitting: "Endocrinology

## 2017-06-11 VITALS — BP 113/72 | HR 93 | Ht 68.0 in | Wt 213.0 lb

## 2017-06-11 DIAGNOSIS — Z794 Long term (current) use of insulin: Secondary | ICD-10-CM | POA: Diagnosis not present

## 2017-06-11 DIAGNOSIS — I10 Essential (primary) hypertension: Secondary | ICD-10-CM

## 2017-06-11 DIAGNOSIS — E782 Mixed hyperlipidemia: Secondary | ICD-10-CM | POA: Diagnosis not present

## 2017-06-11 DIAGNOSIS — IMO0001 Reserved for inherently not codable concepts without codable children: Secondary | ICD-10-CM

## 2017-06-11 DIAGNOSIS — E1165 Type 2 diabetes mellitus with hyperglycemia: Secondary | ICD-10-CM

## 2017-06-11 NOTE — Progress Notes (Signed)
Subjective:    Patient ID: Andre Rowe, male    DOB: 1941-05-07, PCP Sinda Du, MD   Past Medical History:  Diagnosis Date  . Alzheimer disease   . BPH (benign prostatic hyperplasia)   . COPD (chronic obstructive pulmonary disease) ([place])   . Dementia   . Diabetes mellitus without complication ([place])   . Diabetes mellitus, type II ([place])   . GERD (gastroesophageal reflux disease)   . High cholesterol   . Hyperlipidemia   . Hypertension   . Overactive bladder   . Schizophrenia [facility])    Past Surgical History:  Procedure Laterality Date  . unable     Social History   Social History  . Marital status: Single    Spouse name: N/A  . Number of children: N/A  . Years of education: N/A   Social History Main Topics  . Smoking status: Current Every Day Smoker    Packs/day: 0.50    Types: Cigarettes  . Smokeless tobacco: Never Used  . Alcohol use No  . Drug use: No  . Sexual activity: Not Asked   Other Topics Concern  . None   Social History Narrative   ** Merged History Encounter **       Outpatient Encounter Prescriptions as of 06/11/2017  Medication Sig  . acetaminophen (TYLENOL) 325 MG tablet Take 650 mg by mouth every 6 (six) hours as needed for mild pain or moderate pain.  . bimatoprost (LUMIGAN) 0.01 % SOLN Place 1 drop into both eyes at bedtime.  . carbamazepine (TEGRETOL) 200 MG tablet Take 200 mg by mouth 3 (three) times daily.  . clonazePAM (KLONOPIN) 0.5 MG tablet Take 0.5 mg by mouth 2 (two) times daily.   Marland Kitchen donepezil (ARICEPT) 10 MG tablet Take 10 mg by mouth at bedtime.  . folic acid (FOLVITE) 1 MG tablet Take 1 mg by mouth daily.  Marland Kitchen LEVEMIR 100 UNIT/ML injection INJECT 20 UNITS SUBCUTANEOUSLY EACH MORNING.  Marland Kitchen lisinopril (PRINIVIL,ZESTRIL) 10 MG tablet Take 10 mg by mouth daily.  . memantine (NAMENDA) 10 MG tablet Take 10 mg by mouth 2 (two) times daily.  . metFORMIN (GLUCOPHAGE) 1000 MG tablet TAKE 1 TABLET BY MOUTH TWICE DAILY.  Marland Kitchen  OLANZapine (ZYPREXA) 10 MG tablet Take 10 mg by mouth at bedtime.  Marland Kitchen omeprazole (PRILOSEC) 20 MG capsule Take 20 mg by mouth daily.  Marland Kitchen oxybutynin (DITROPAN) 5 MG tablet Take 5 mg by mouth 2 (two) times daily.  . povidone-iodine (BETADINE) 10 % external solution Apply 1 application topically daily. Apply to skin between the 4th and 5th toes on both feet once daily  . senna (SENOKOT) 8.6 MG TABS tablet Take 1 tablet by mouth at bedtime.  . simvastatin (ZOCOR) 40 MG tablet Take 40 mg by mouth daily.  . tamsulosin (FLOMAX) 0.4 MG CAPS capsule Take 0.4 mg by mouth daily.  . [DISCONTINUED] azithromycin (ZITHROMAX) 250 MG tablet Take 1 tablet (250 mg total) by mouth daily. Take first 2 tablets together, then 1 every day until finished. (Patient not taking: Reported on 06/09/2017)  . [DISCONTINUED] insulin aspart (NOVOLOG FLEXPEN) 100 UNIT/ML FlexPen Inject 5 Units into the skin 3 (three) times daily with meals.  . [DISCONTINUED] LEVEMIR 100 UNIT/ML injection INJECT 20 UNITS SUBCUTANEOUSLY EACH MORNING.  . [DISCONTINUED] LEVEMIR 100 UNIT/ML injection INJECT 20 UNITS SUBCUTANEOUSLY EACH MORNING.  . [DISCONTINUED] metFORMIN (GLUCOPHAGE) 1000 MG tablet Take 1 tablet (1,000 mg total) by mouth 2 (two) times daily with a meal.  . [  DISCONTINUED] Vitamin D, Ergocalciferol, (DRISDOL) 50000 units CAPS capsule Take 1 capsule (50,000 Units total) by mouth every 7 (seven) days.  . [DISCONTINUED] Vitamin D, Ergocalciferol, (DRISDOL) 50000 units CAPS capsule TAKE 1 TABLET BY MOUTH ONCE WEEKLY.  . [DISCONTINUED] Vitamin D, Ergocalciferol, (DRISDOL) 50000 units CAPS capsule TAKE 1 TABLET BY MOUTH ONCE WEEKLY.   No facility-administered encounter medications on file as of 06/11/2017.    ALLERGIES: No Known Allergies VACCINATION STATUS:  There is no immunization history on file for this patient.  Diabetes  He presents for his follow-up diabetic visit. He has type 2 diabetes mellitus. Onset time: he was diagnosed at  age 77 yrs. His disease course has been stable. There are no hypoglycemic associated symptoms. Pertinent negatives for hypoglycemia include no confusion, headaches, pallor or seizures. There are no diabetic associated symptoms. Pertinent negatives for diabetes include no chest pain, no fatigue, no polydipsia, no polyphagia, no polyuria and no weakness. There are no hypoglycemic complications. Symptoms are stable. There are no diabetic complications. Risk factors for coronary artery disease include diabetes mellitus, dyslipidemia, hypertension, male sex, obesity, sedentary lifestyle and tobacco exposure. Current diabetic treatment includes insulin injections and oral agent (monotherapy). He is compliant with treatment most of the time. His weight is stable. He is following a generally unhealthy diet. He never participates in exercise. His breakfast blood glucose range is generally 130-140 mg/dl. His dinner blood glucose range is generally 130-140 mg/dl. His overall blood glucose range is 130-140 mg/dl. An ACE inhibitor/angiotensin II receptor blocker is being taken.  Hyperlipidemia  This is a chronic problem. The current episode started more than 1 year ago. The problem is controlled. Exacerbating diseases include diabetes and obesity. Pertinent negatives include no chest pain, myalgias or shortness of breath. Current antihyperlipidemic treatment includes statins. Risk factors for coronary artery disease include diabetes mellitus, dyslipidemia, hypertension, male sex, obesity and a sedentary lifestyle.  Hypertension  This is a chronic problem. The current episode started more than 1 year ago. The problem is controlled. Pertinent negatives include no chest pain, headaches, neck pain, palpitations or shortness of breath. Risk factors for coronary artery disease include diabetes mellitus, dyslipidemia, smoking/tobacco exposure and obesity. Past treatments include ACE inhibitors.     Review of Systems   Constitutional: Negative for fatigue and unexpected weight change.  HENT: Negative for dental problem, mouth sores and trouble swallowing.   Eyes: Negative for visual disturbance.  Respiratory: Negative for cough, choking, chest tightness, shortness of breath and wheezing.   Cardiovascular: Negative for chest pain, palpitations and leg swelling.  Gastrointestinal: Positive for constipation. Negative for abdominal distention, abdominal pain, diarrhea, nausea and vomiting.  Endocrine: Negative for polydipsia, polyphagia and polyuria.  Genitourinary: Negative for dysuria, flank pain, hematuria and urgency.  Musculoskeletal: Negative for back pain, gait problem, myalgias and neck pain.  Skin: Negative for pallor, rash and wound.  Neurological: Negative for seizures, syncope, weakness, numbness and headaches.  Psychiatric/Behavioral: Negative.  Negative for confusion and dysphoric mood.    Objective:    BP 113/72   Pulse 93   Ht '5\' 8"'  (1.727 m)   Wt 213 lb (96.6 kg)   BMI 32.39 kg/m   Wt Readings from Last 3 Encounters:  06/11/17 213 lb (96.6 kg)  06/09/17 210 lb (95.3 kg)  04/22/17 205 lb (93 kg)    Physical Exam  Constitutional: He is oriented to person, place, and time. He appears well-developed and well-nourished. He is cooperative. No distress.  HENT:  Head: Normocephalic and  atraumatic.  Eyes: EOM are normal.  Neck: Normal range of motion. Neck supple. No tracheal deviation present. No thyromegaly present.  Cardiovascular: Normal rate, S1 normal, S2 normal and normal heart sounds.  Exam reveals no gallop.   No murmur heard. Pulses:      Dorsalis pedis pulses are 1+ on the right side, and 1+ on the left side.       Posterior tibial pulses are 1+ on the right side, and 1+ on the left side.  Pulmonary/Chest: Breath sounds normal. No respiratory distress. He has no wheezes.  Abdominal: Soft. Bowel sounds are normal. He exhibits no distension. There is no tenderness. There is no  guarding and no CVA tenderness.  Musculoskeletal: He exhibits no edema.       Right shoulder: He exhibits no swelling and no deformity.  Neurological: He is alert and oriented to person, place, and time. He has normal strength and normal reflexes. No cranial nerve deficit or sensory deficit. Gait normal.  Skin: Skin is warm and dry. No rash noted. No cyanosis. Nails show no clubbing.  Psychiatric: He has a normal mood and affect. His speech is normal and behavior is normal. Judgment and thought content normal. Cognition and memory are normal.    Recent Results (from the past 2160 hour(s))  Brain natriuretic peptide     Status: None   Collection Time: 04/22/17  9:22 PM  Result Value Ref Range   B Natriuretic Peptide 9.0 0.0 - 100.0 pg/mL  Basic metabolic panel     Status: Abnormal   Collection Time: 04/22/17  9:51 PM  Result Value Ref Range   Sodium 139 135 - 145 mmol/L   Potassium 4.2 3.5 - 5.1 mmol/L   Chloride 103 101 - 111 mmol/L   CO2 26 22 - 32 mmol/L   Glucose, Bld 93 65 - 99 mg/dL   BUN 18 6 - 20 mg/dL   Creatinine, Ser 0.91 0.61 - 1.24 mg/dL   Calcium 8.7 (L) 8.9 - 10.3 mg/dL   GFR calc non Af Amer >60 >60 mL/min   GFR calc Af Amer >60 >60 mL/min    Comment: (NOTE) The eGFR has been calculated using the CKD EPI equation. This calculation has not been validated in all clinical situations. eGFR's persistently <60 mL/min signify possible Chronic Kidney Disease.    Anion gap 10 5 - 15  CBC     Status: Abnormal   Collection Time: 04/22/17  9:51 PM  Result Value Ref Range   WBC 5.9 4.0 - 10.5 K/uL   RBC 3.50 (L) 4.22 - 5.81 MIL/uL   Hemoglobin 12.5 (L) 13.0 - 17.0 g/dL   HCT 37.8 (L) 39.0 - 52.0 %   MCV 108.0 (H) 78.0 - 100.0 fL   MCH 35.7 (H) 26.0 - 34.0 pg   MCHC 33.1 30.0 - 36.0 g/dL   RDW 14.8 11.5 - 15.5 %   Platelets 209 150 - 400 K/uL  Troponin I     Status: None   Collection Time: 04/22/17  9:51 PM  Result Value Ref Range   Troponin I <0.03 <0.03 ng/mL   Troponin I     Status: None   Collection Time: 04/23/17 12:16 AM  Result Value Ref Range   Troponin I <0.03 <0.03 ng/mL  Renal function panel     Status: Abnormal   Collection Time: 06/05/17  9:44 AM  Result Value Ref Range   Glucose, Bld 135 (H) 65 - 99 mg/dL  Comment: .            Fasting reference interval . For someone without known diabetes, a glucose value >125 mg/dL indicates that they may have diabetes and this should be confirmed with a follow-up test. .    BUN 19 7 - 25 mg/dL   Creat 0.98 0.70 - 1.18 mg/dL    Comment: For patients >24 years of age, the reference limit for Creatinine is approximately 13% higher for people identified as African-American. .    BUN/Creatinine Ratio NOT APPLICABLE 6 - 22 (calc)   Sodium 140 135 - 146 mmol/L   Potassium 4.9 3.5 - 5.3 mmol/L   Chloride 102 98 - 110 mmol/L   CO2 32 20 - 32 mmol/L   Calcium 9.2 8.6 - 10.3 mg/dL   Phosphorus 3.9 2.1 - 4.3 mg/dL   Albumin 3.7 3.6 - 5.1 g/dL  Hemoglobin A1c     Status: Abnormal   Collection Time: 06/05/17  9:44 AM  Result Value Ref Range   Hgb A1c MFr Bld 6.9 (H) <5.7 % of total Hgb    Comment: For someone without known diabetes, a hemoglobin A1c value of 6.5% or greater indicates that they may have  diabetes and this should be confirmed with a follow-up  test. . For someone with known diabetes, a value <7% indicates  that their diabetes is well controlled and a value  greater than or equal to 7% indicates suboptimal  control. A1c targets should be individualized based on  duration of diabetes, age, comorbid conditions, and  other considerations. . Currently, no consensus exists regarding use of hemoglobin A1c for diagnosis of diabetes for children. .    Mean Plasma Glucose 151 (calc)   eAG (mmol/L) 8.4 (calc)     Assessment & Plan:   1. Uncontrolled type 2 diabetes mellitus without complication, with long-term current use of insulin ([place])  - He remains at a high risk  for more acute and chronic complications of diabetes which include CAD, CVA, CKD, retinopathy, and neuropathy. These are all discussed in detail with the patient.  Patient came with stable blood glucose profile, and recent A1c at 6.9%. Glucose logs and insulin administration records pertaining to this visit,  to be scanned into patient's records.  Recent labs reviewed.   - I have re-counseled the patient on diet management and weight loss  by adopting a carbohydrate restricted / protein rich  Diet.  -  Suggestion is made for him to avoid simple carbohydrates  from his diet including Cakes, Sweet Desserts / Pastries, Ice Cream, Soda (diet and regular), Sweet Tea, Candies, Chips, Cookies, Store Bought Juices, Alcohol in Excess of  1-2 drinks a day, Artificial Sweeteners, and "Sugar-free" Products. This will help patient to have stable blood glucose profile and potentially avoid unintended weight gain.   - Patient is advised to stick to a routine mealtimes to eat 3 meals  a day and avoid unnecessary snacks ( to snack only to correct hypoglycemia).  - I have approached patient with the following individualized plan to manage diabetes and patient agrees.  - Continue Levemir  20 units every morning with breakfast, continue to monitor blood glucose twice a day-daily before breakfast and at bedtime.   His care taker is encouraged to call clinic if he has blood glucose levels less than 70 or above 300 mg /dl.  -I will continue metformin 1085m by mouth twice a day after meals.    - Patient specific target  for A1c; LDL, HDL, Triglycerides, and  Waist Circumference were discussed in detail.  2) BP/HTN:   Controlled. I advised him to continue current medications including ACEI/ARB. 3) Lipids/HPL:  continue statins. 4)  Weight/Diet: exercise, and carbohydrates information provided.  5) Chronic Care/Health Maintenance:  -Patient is  on ACEI/ARB and Statin medications and encouraged to continue to  follow up with Ophthalmology, Podiatrist at least yearly or according to recommendations, and advised to quit  smoking. I have recommended yearly flu vaccine and pneumonia vaccination at least every 5 years; moderate intensity exercise for up to 150 minutes weekly; and  sleep for at least 7 hours a day.  - Time spent with the patient: 25 min, of which >50% was spent in reviewing his sugar logs , discussing his hypo- and hyper-glycemic episodes, reviewing his current and  previous labs and insulin doses and developing a plan to avoid hypo- and hyper-glycemia.    - I advised patient to maintain close follow up with Sinda Du, MD for primary care needs.   Follow up plan: -Return in about 3 months (around 09/11/2017) for follow up with pre-visit labs, meter, and logs.  Glade Lloyd, MD Phone: [phone]  Fax: [phone]  -  This note was partially dictated with voice recognition software. Similar sounding words can be transcribed inadequately or may not  be corrected upon review.  06/11/2017, 3:07 PM

## 2017-06-12 ENCOUNTER — Encounter: Payer: Self-pay | Admitting: "Endocrinology

## 2017-06-23 ENCOUNTER — Other Ambulatory Visit: Payer: Self-pay | Admitting: "Endocrinology

## 2017-06-26 ENCOUNTER — Other Ambulatory Visit: Payer: Self-pay | Admitting: "Endocrinology

## 2017-07-09 DIAGNOSIS — F250 Schizoaffective disorder, bipolar type: Secondary | ICD-10-CM | POA: Diagnosis not present

## 2017-08-26 DIAGNOSIS — E1342 Other specified diabetes mellitus with diabetic polyneuropathy: Secondary | ICD-10-CM | POA: Diagnosis not present

## 2017-08-26 DIAGNOSIS — B351 Tinea unguium: Secondary | ICD-10-CM | POA: Diagnosis not present

## 2017-08-26 DIAGNOSIS — L851 Acquired keratosis [keratoderma] palmaris et plantaris: Secondary | ICD-10-CM | POA: Diagnosis not present

## 2017-08-27 ENCOUNTER — Other Ambulatory Visit: Payer: Self-pay | Admitting: "Endocrinology

## 2017-09-02 DIAGNOSIS — H2513 Age-related nuclear cataract, bilateral: Secondary | ICD-10-CM | POA: Diagnosis not present

## 2017-09-02 DIAGNOSIS — E119 Type 2 diabetes mellitus without complications: Secondary | ICD-10-CM | POA: Diagnosis not present

## 2017-09-02 DIAGNOSIS — H47093 Other disorders of optic nerve, not elsewhere classified, bilateral: Secondary | ICD-10-CM | POA: Diagnosis not present

## 2017-09-02 DIAGNOSIS — H401132 Primary open-angle glaucoma, bilateral, moderate stage: Secondary | ICD-10-CM | POA: Diagnosis not present

## 2017-09-02 DIAGNOSIS — Z794 Long term (current) use of insulin: Secondary | ICD-10-CM | POA: Diagnosis not present

## 2017-09-04 DIAGNOSIS — E1165 Type 2 diabetes mellitus with hyperglycemia: Secondary | ICD-10-CM | POA: Diagnosis not present

## 2017-09-04 DIAGNOSIS — Z794 Long term (current) use of insulin: Secondary | ICD-10-CM | POA: Diagnosis not present

## 2017-09-05 LAB — COMPLETE METABOLIC PANEL WITH GFR
AG RATIO: 1.7 (calc) (ref 1.0–2.5)
ALKALINE PHOSPHATASE (APISO): 99 U/L (ref 40–115)
ALT: 12 U/L (ref 9–46)
AST: 13 U/L (ref 10–35)
Albumin: 4 g/dL (ref 3.6–5.1)
BUN: 17 mg/dL (ref 7–25)
CHLORIDE: 104 mmol/L (ref 98–110)
CO2: 31 mmol/L (ref 20–32)
Calcium: 9.3 mg/dL (ref 8.6–10.3)
Creat: 0.74 mg/dL (ref 0.70–1.18)
GFR, EST NON AFRICAN AMERICAN: 91 mL/min/{1.73_m2} (ref >=60)
GFR, Est African American: 105 mL/min/{1.73_m2} (ref >=60)
GLOBULIN: 2.4 g/dL (ref 1.9–3.7)
Glucose, Bld: 137 mg/dL (ref 65–139)
POTASSIUM: 4.5 mmol/L (ref 3.5–5.3)
SODIUM: 141 mmol/L (ref 135–146)
Total Bilirubin: 0.2 mg/dL (ref 0.2–1.2)
Total Protein: 6.4 g/dL (ref 6.1–8.1)

## 2017-09-05 LAB — HEMOGLOBIN A1C
EAG (MMOL/L): 9.3 (calc)
HEMOGLOBIN A1C: 7.5 %{Hb} — AB (ref <5.7)
MEAN PLASMA GLUCOSE: 169 (calc)

## 2017-09-08 DIAGNOSIS — J449 Chronic obstructive pulmonary disease, unspecified: Secondary | ICD-10-CM | POA: Diagnosis not present

## 2017-09-08 DIAGNOSIS — F209 Schizophrenia, unspecified: Secondary | ICD-10-CM | POA: Diagnosis not present

## 2017-09-08 DIAGNOSIS — N138 Other obstructive and reflux uropathy: Secondary | ICD-10-CM | POA: Diagnosis not present

## 2017-09-08 DIAGNOSIS — I10 Essential (primary) hypertension: Secondary | ICD-10-CM | POA: Diagnosis not present

## 2017-09-10 ENCOUNTER — Ambulatory Visit: Payer: Medicare Other | Admitting: "Endocrinology

## 2017-09-11 ENCOUNTER — Encounter: Payer: Self-pay | Admitting: "Endocrinology

## 2017-09-11 ENCOUNTER — Ambulatory Visit (INDEPENDENT_AMBULATORY_CARE_PROVIDER_SITE_OTHER): Payer: Medicare Other | Admitting: "Endocrinology

## 2017-09-11 VITALS — BP 106/74 | HR 81 | Ht 68.0 in | Wt 212.0 lb

## 2017-09-11 DIAGNOSIS — E1165 Type 2 diabetes mellitus with hyperglycemia: Secondary | ICD-10-CM

## 2017-09-11 DIAGNOSIS — E782 Mixed hyperlipidemia: Secondary | ICD-10-CM

## 2017-09-11 DIAGNOSIS — Z794 Long term (current) use of insulin: Secondary | ICD-10-CM | POA: Diagnosis not present

## 2017-09-11 DIAGNOSIS — IMO0001 Reserved for inherently not codable concepts without codable children: Secondary | ICD-10-CM

## 2017-09-11 DIAGNOSIS — I10 Essential (primary) hypertension: Secondary | ICD-10-CM

## 2017-09-11 MED ORDER — INSULIN DETEMIR 100 UNIT/ML ~~LOC~~ SOLN: SUBCUTANEOUS | 2 refills | Status: DC

## 2017-09-11 NOTE — Progress Notes (Signed)
Subjective:    Patient ID: Andre Rowe, male    DOB: 1941/06/10, PCP Kari BaarsHawkins, Edward, MD   Past Medical History:  Diagnosis Date  . Alzheimer disease   . BPH (benign prostatic hyperplasia)   . COPD (chronic obstructive pulmonary disease) (HCC)   . Dementia   . Diabetes mellitus without complication (HCC)   . Diabetes mellitus, type II (HCC)   . GERD (gastroesophageal reflux disease)   . High cholesterol   . Hyperlipidemia   . Hypertension   . Overactive bladder   . Schizophrenia [facility](HCC)    Past Surgical History:  Procedure Laterality Date  . unable     Social History   Socioeconomic History  . Marital status: Single    Spouse name: None  . Number of children: None  . Years of education: None  . Highest education level: None  Social Needs  . Financial resource strain: None  . Food insecurity - worry: None  . Food insecurity - inability: None  . Transportation needs - medical: None  . Transportation needs - non-medical: None  Occupational History  . None  Tobacco Use  . Smoking status: Current Every Day Smoker    Packs/day: 0.50    Types: Cigarettes  . Smokeless tobacco: Never Used  Substance and Sexual Activity  . Alcohol use: No  . Drug use: No  . Sexual activity: None  Other Topics Concern  . None  Social History Narrative   ** Merged History Encounter **       Outpatient Encounter Medications as of 09/11/2017  Medication Sig  . acetaminophen (TYLENOL) 325 MG tablet Take 650 mg by mouth every 6 (six) hours as needed for mild pain or moderate pain.  . bimatoprost (LUMIGAN) 0.01 % SOLN Place 1 drop into both eyes at bedtime.  . carbamazepine (TEGRETOL) 200 MG tablet Take 200 mg by mouth 3 (three) times daily.  . clonazePAM (KLONOPIN) 0.5 MG tablet Take 0.5 mg by mouth 2 (two) times daily.   Marland Kitchen. donepezil (ARICEPT) 10 MG tablet Take 10 mg by mouth at bedtime.  . folic acid (FOLVITE) 1 MG tablet Take 1 mg by mouth daily.  . insulin detemir (LEVEMIR)  100 UNIT/ML injection INJECT 25 UNITS SUBCUTANEOUSLY EACH MORNING.  Marland Kitchen. lisinopril (PRINIVIL,ZESTRIL) 10 MG tablet Take 10 mg by mouth daily.  . memantine (NAMENDA) 10 MG tablet Take 10 mg by mouth 2 (two) times daily.  . metFORMIN (GLUCOPHAGE) 1000 MG tablet TAKE 1 TABLET BY MOUTH TWICE DAILY.  Marland Kitchen. OLANZapine (ZYPREXA) 10 MG tablet Take 10 mg by mouth at bedtime.  Marland Kitchen. omeprazole (PRILOSEC) 20 MG capsule Take 20 mg by mouth daily.  Marland Kitchen. oxybutynin (DITROPAN) 5 MG tablet Take 5 mg by mouth 2 (two) times daily.  . povidone-iodine (BETADINE) 10 % external solution Apply 1 application topically daily. Apply to skin between the 4th and 5th toes on both feet once daily  . senna (SENOKOT) 8.6 MG TABS tablet Take 1 tablet by mouth at bedtime.  . simvastatin (ZOCOR) 40 MG tablet Take 40 mg by mouth daily.  . tamsulosin (FLOMAX) 0.4 MG CAPS capsule Take 0.4 mg by mouth daily.  . [DISCONTINUED] insulin detemir (LEVEMIR) 100 UNIT/ML injection INJECT 25 UNITS SUBCUTANEOUSLY EACH MORNING.  . [DISCONTINUED] LEVEMIR 100 UNIT/ML injection INJECT 20 UNITS SUBCUTANEOUSLY EACH MORNING.   No facility-administered encounter medications on file as of 09/11/2017.    ALLERGIES: No Known Allergies VACCINATION STATUS:  There is no immunization history on file for  this patient.  Diabetes  He presents for his follow-up diabetic visit. He has type 2 diabetes mellitus. Onset time: he was diagnosed at age 51 yrs. His disease course has been worsening. There are no hypoglycemic associated symptoms. Pertinent negatives for hypoglycemia include no confusion, headaches, pallor or seizures. There are no diabetic associated symptoms. Pertinent negatives for diabetes include no chest pain, no fatigue, no polydipsia, no polyphagia, no polyuria and no weakness. There are no hypoglycemic complications. Symptoms are worsening. There are no diabetic complications. Risk factors for coronary artery disease include diabetes mellitus, dyslipidemia,  hypertension, male sex, obesity, sedentary lifestyle and tobacco exposure. Current diabetic treatment includes insulin injections and oral agent (monotherapy). He is compliant with treatment most of the time. His weight is stable. He is following a generally unhealthy diet. He never participates in exercise. His breakfast blood glucose range is generally 130-140 mg/dl. His dinner blood glucose range is generally 130-140 mg/dl. His overall blood glucose range is 130-140 mg/dl. An ACE inhibitor/angiotensin II receptor blocker is being taken.  Hyperlipidemia  This is a chronic problem. The current episode started more than 1 year ago. The problem is controlled. Exacerbating diseases include diabetes and obesity. Pertinent negatives include no chest pain, myalgias or shortness of breath. Current antihyperlipidemic treatment includes statins. Risk factors for coronary artery disease include diabetes mellitus, dyslipidemia, hypertension, male sex, obesity and a sedentary lifestyle.  Hypertension  This is a chronic problem. The current episode started more than 1 year ago. The problem is controlled. Pertinent negatives include no chest pain, headaches, neck pain, palpitations or shortness of breath. Risk factors for coronary artery disease include diabetes mellitus, dyslipidemia, smoking/tobacco exposure and obesity. Past treatments include ACE inhibitors.    Review of Systems  Constitutional: Negative for fatigue and unexpected weight change.  HENT: Negative for dental problem, mouth sores and trouble swallowing.   Eyes: Negative for visual disturbance.  Respiratory: Negative for cough, choking, chest tightness, shortness of breath and wheezing.   Cardiovascular: Negative for chest pain, palpitations and leg swelling.  Gastrointestinal: Positive for constipation. Negative for abdominal distention, abdominal pain, diarrhea, nausea and vomiting.  Endocrine: Negative for polydipsia, polyphagia and polyuria.   Genitourinary: Negative for dysuria, flank pain, hematuria and urgency.  Musculoskeletal: Negative for back pain, gait problem, myalgias and neck pain.  Skin: Negative for pallor, rash and wound.  Neurological: Negative for seizures, syncope, weakness, numbness and headaches.  Psychiatric/Behavioral: Negative.  Negative for confusion and dysphoric mood.    Objective:    BP 106/74   Pulse 81   Ht 5\' 8"  (1.727 m)   Wt 212 lb (96.2 kg)   BMI 32.23 kg/m   Wt Readings from Last 3 Encounters:  09/11/17 212 lb (96.2 kg)  06/11/17 213 lb (96.6 kg)  06/09/17 210 lb (95.3 kg)    Physical Exam  Constitutional: He is oriented to person, place, and time. He appears well-developed and well-nourished. He is cooperative. No distress.  HENT:  Head: Normocephalic and atraumatic.  Eyes: EOM are normal.  Neck: Normal range of motion. Neck supple. No tracheal deviation present. No thyromegaly present.  Cardiovascular: Normal rate, S1 normal, S2 normal and normal heart sounds. Exam reveals no gallop.  No murmur heard. Pulses:      Dorsalis pedis pulses are 1+ on the right side, and 1+ on the left side.       Posterior tibial pulses are 1+ on the right side, and 1+ on the left side.  Pulmonary/Chest: Breath sounds  normal. No respiratory distress. He has no wheezes.  Abdominal: Soft. Bowel sounds are normal. He exhibits no distension. There is no tenderness. There is no guarding and no CVA tenderness.  Musculoskeletal: He exhibits no edema.       Right shoulder: He exhibits no swelling and no deformity.  Neurological: He is alert and oriented to person, place, and time. He has normal strength and normal reflexes. No cranial nerve deficit or sensory deficit. Gait normal.  Skin: Skin is warm and dry. No rash noted. No cyanosis. Nails show no clubbing.  Psychiatric: He has a normal mood and affect. His speech is normal and behavior is normal. Judgment and thought content normal. Cognition and memory are  normal.    Recent Results (from the past 2160 hour(s))  COMPLETE METABOLIC PANEL WITH GFR     Status: None   Collection Time: 09/04/17  9:14 AM  Result Value Ref Range   Glucose, Bld 137 65 - 139 mg/dL    Comment: .        Non-fasting reference interval .    BUN 17 7 - 25 mg/dL   Creat 4.09 8.11 - 9.14 mg/dL    Comment: For patients >47 years of age, the reference limit for Creatinine is approximately 13% higher for people identified as African-American. .    GFR, Est Non African American 91 > OR = 60 mL/min/1.51m2   GFR, Est African American 105 > OR = 60 mL/min/1.8m2   BUN/Creatinine Ratio NOT APPLICABLE 6 - 22 (calc)   Sodium 141 135 - 146 mmol/L   Potassium 4.5 3.5 - 5.3 mmol/L   Chloride 104 98 - 110 mmol/L   CO2 31 20 - 32 mmol/L   Calcium 9.3 8.6 - 10.3 mg/dL   Total Protein 6.4 6.1 - 8.1 g/dL   Albumin 4.0 3.6 - 5.1 g/dL   Globulin 2.4 1.9 - 3.7 g/dL (calc)   AG Ratio 1.7 1.0 - 2.5 (calc)   Total Bilirubin 0.2 0.2 - 1.2 mg/dL   Alkaline phosphatase (APISO) 99 40 - 115 U/L   AST 13 10 - 35 U/L   ALT 12 9 - 46 U/L  Hemoglobin A1c     Status: Abnormal   Collection Time: 09/04/17  9:14 AM  Result Value Ref Range   Hgb A1c MFr Bld 7.5 (H) <5.7 % of total Hgb    Comment: For someone without known diabetes, a hemoglobin A1c value of 6.5% or greater indicates that they may have  diabetes and this should be confirmed with a follow-up  test. . For someone with known diabetes, a value <7% indicates  that their diabetes is well controlled and a value  greater than or equal to 7% indicates suboptimal  control. A1c targets should be individualized based on  duration of diabetes, age, comorbid conditions, and  other considerations. . Currently, no consensus exists regarding use of hemoglobin A1c for diagnosis of diabetes for children. .    Mean Plasma Glucose 169 (calc)   eAG (mmol/L) 9.3 (calc)     Assessment & Plan:   1. Uncontrolled type 2 diabetes mellitus  without complication, with long-term current use of insulin (HCC)  - He remains at a high risk for more acute and chronic complications of diabetes which include CAD, CVA, CKD, retinopathy, and neuropathy. These are all discussed in detail with the patient.  Patient came with  increased blood glucose profile, A1c 7.5% up from 6.9% last visit.   Glucose logs and  insulin administration records pertaining to this visit,  to be scanned into patient's records.  Recent labs reviewed.   - I have re-counseled the patient on diet management and weight loss  by adopting a carbohydrate restricted / protein rich  Diet.  -  Suggestion is made for him to avoid simple carbohydrates  from his diet including Cakes, Sweet Desserts / Pastries, Ice Cream, Soda (diet and regular), Sweet Tea, Candies, Chips, Cookies, Store Bought Juices, Alcohol in Excess of  1-2 drinks a day, Artificial Sweeteners, and "Sugar-free" Products. This will help patient to have stable blood glucose profile and potentially avoid unintended weight gain.   - Patient is advised to stick to a routine mealtimes to eat 3 meals  a day and avoid unnecessary snacks ( to snack only to correct hypoglycemia).  - I have approached patient with the following individualized plan to manage diabetes and patient agrees.  - I advised him to increase Levemir to 25  units every morning with breakfast, continue to monitor blood glucose twice a day-daily before breakfast and at bedtime.   His care taker is encouraged to call clinic if he has blood glucose levels less than 70 or above 300 mg /dl.  -I will continue metformin 1000mg  by mouth twice a day after meals.    - Patient specific target  for A1c; LDL, HDL, Triglycerides, and  Waist Circumference were discussed in detail.  2) BP/HTN:  His blood pressure is controlled to target. I advised him to continue current medications including ACEI/ARB. 3) Lipids/HPL:  continue statins. 4)  Weight/Diet:  exercise, and carbohydrates information provided.  5) Chronic Care/Health Maintenance:  -Patient is  on ACEI/ARB and Statin medications and encouraged to continue to follow up with Ophthalmology, Podiatrist at least yearly or according to recommendations, and advised to quit  smoking. I have recommended yearly flu vaccine and pneumonia vaccination at least every 5 years; moderate intensity exercise for up to 150 minutes weekly; and  sleep for at least 7 hours a day.  - Time spent with the patient: 25 min, of which >50% was spent in reviewing his blood glucose logs , discussing his hypo- and hyper-glycemic episodes, reviewing his current and  previous labs and insulin doses and developing a plan to avoid hypo- and hyper-glycemia. Please refer to Patient Instructions for Blood Glucose Monitoring and Insulin/Medications Dosing Guide"  in media tab for additional information.  - I advised patient to maintain close follow up with Kari Baars, MD for primary care needs.   Follow up plan: -Return in about 4 months (around 01/09/2018) for follow up with pre-visit labs, meter, and logs.  Marquis Lunch, MD Phone: [phone]  Fax: [phone]  -  This note was partially dictated with voice recognition software. Similar sounding words can be transcribed inadequately or may not  be corrected upon review.  09/11/2017, 5:18 PM

## 2017-10-13 ENCOUNTER — Other Ambulatory Visit: Payer: Self-pay

## 2017-10-13 MED ORDER — "INSULIN SYRINGE 31G X 5/16"" 0.5 ML MISC": 1.0000 | 5 refills | Status: DC

## 2017-10-27 ENCOUNTER — Other Ambulatory Visit: Payer: Self-pay | Admitting: "Endocrinology

## 2017-11-04 DIAGNOSIS — E1342 Other specified diabetes mellitus with diabetic polyneuropathy: Secondary | ICD-10-CM | POA: Diagnosis not present

## 2017-11-04 DIAGNOSIS — L851 Acquired keratosis [keratoderma] palmaris et plantaris: Secondary | ICD-10-CM | POA: Diagnosis not present

## 2017-11-04 DIAGNOSIS — B351 Tinea unguium: Secondary | ICD-10-CM | POA: Diagnosis not present

## 2017-11-20 ENCOUNTER — Other Ambulatory Visit: Payer: Self-pay | Admitting: "Endocrinology

## 2017-12-24 ENCOUNTER — Other Ambulatory Visit: Payer: Self-pay | Admitting: "Endocrinology

## 2017-12-30 DIAGNOSIS — F250 Schizoaffective disorder, bipolar type: Secondary | ICD-10-CM | POA: Diagnosis not present

## 2018-01-01 ENCOUNTER — Other Ambulatory Visit: Payer: Self-pay | Admitting: "Endocrinology

## 2018-01-08 DIAGNOSIS — Z794 Long term (current) use of insulin: Secondary | ICD-10-CM | POA: Diagnosis not present

## 2018-01-08 DIAGNOSIS — E1165 Type 2 diabetes mellitus with hyperglycemia: Secondary | ICD-10-CM | POA: Diagnosis not present

## 2018-01-09 LAB — COMPLETE METABOLIC PANEL WITH GFR
AG Ratio: 1.8 (calc) (ref 1.0–2.5)
ALKALINE PHOSPHATASE (APISO): 92 U/L (ref 40–115)
ALT: 9 U/L (ref 9–46)
AST: 11 U/L (ref 10–35)
Albumin: 3.8 g/dL (ref 3.6–5.1)
BUN: 22 mg/dL (ref 7–25)
CALCIUM: 9.1 mg/dL (ref 8.6–10.3)
CO2: 32 mmol/L (ref 20–32)
CREATININE: 0.86 mg/dL (ref 0.70–1.18)
Chloride: 105 mmol/L (ref 98–110)
GFR, EST NON AFRICAN AMERICAN: 85 mL/min/{1.73_m2} (ref >=60)
GFR, Est African American: 98 mL/min/{1.73_m2} (ref >=60)
Globulin: 2.1 g/dL (calc) (ref 1.9–3.7)
Glucose, Bld: 127 mg/dL — ABNORMAL HIGH (ref 65–99)
Potassium: 4.5 mmol/L (ref 3.5–5.3)
SODIUM: 141 mmol/L (ref 135–146)
Total Bilirubin: 0.2 mg/dL (ref 0.2–1.2)
Total Protein: 5.9 g/dL — ABNORMAL LOW (ref 6.1–8.1)

## 2018-01-09 LAB — HEMOGLOBIN A1C
Hgb A1c MFr Bld: 7.1 % of total Hgb — ABNORMAL HIGH (ref <5.7)
Mean Plasma Glucose: 157 (calc)
eAG (mmol/L): 8.7 (calc)

## 2018-01-14 ENCOUNTER — Ambulatory Visit: Payer: Medicare Other | Admitting: "Endocrinology

## 2018-01-22 ENCOUNTER — Other Ambulatory Visit: Payer: Self-pay | Admitting: "Endocrinology

## 2018-01-28 ENCOUNTER — Encounter: Payer: Self-pay | Admitting: "Endocrinology

## 2018-01-28 ENCOUNTER — Ambulatory Visit (INDEPENDENT_AMBULATORY_CARE_PROVIDER_SITE_OTHER): Payer: Medicare Other | Admitting: "Endocrinology

## 2018-01-28 VITALS — BP 141/80 | HR 79 | Ht 68.0 in | Wt 212.0 lb

## 2018-01-28 DIAGNOSIS — Z794 Long term (current) use of insulin: Secondary | ICD-10-CM

## 2018-01-28 DIAGNOSIS — E782 Mixed hyperlipidemia: Secondary | ICD-10-CM | POA: Diagnosis not present

## 2018-01-28 DIAGNOSIS — I10 Essential (primary) hypertension: Secondary | ICD-10-CM | POA: Diagnosis not present

## 2018-01-28 DIAGNOSIS — IMO0001 Reserved for inherently not codable concepts without codable children: Secondary | ICD-10-CM

## 2018-01-28 DIAGNOSIS — E1165 Type 2 diabetes mellitus with hyperglycemia: Secondary | ICD-10-CM

## 2018-01-28 NOTE — Patient Instructions (Signed)

## 2018-01-28 NOTE — Progress Notes (Signed)
Subjective:    Patient ID: Andre Rowe, male    DOB: 04/17/1941, PCP Kari Baars, MD   Past Medical History:  Diagnosis Date  . Alzheimer disease   . BPH (benign prostatic hyperplasia)   . COPD (chronic obstructive pulmonary disease) (HCC)   . Dementia   . Diabetes mellitus without complication (HCC)   . Diabetes mellitus, type II (HCC)   . GERD (gastroesophageal reflux disease)   . High cholesterol   . Hyperlipidemia   . Hypertension   . Overactive bladder   . Schizophrenia [facility])    Past Surgical History:  Procedure Laterality Date  . unable     Social History   Socioeconomic History  . Marital status: Single    Spouse name: Not on file  . Number of children: Not on file  . Years of education: Not on file  . Highest education level: Not on file  Occupational History  . Not on file  Social Needs  . Financial resource strain: Not on file  . Food insecurity:    Worry: Not on file    Inability: Not on file  . Transportation needs:    Medical: Not on file    Non-medical: Not on file  Tobacco Use  . Smoking status: Current Every Day Smoker    Packs/day: 0.50    Types: Cigarettes  . Smokeless tobacco: Never Used  Substance and Sexual Activity  . Alcohol use: No  . Drug use: No  . Sexual activity: Not on file  Lifestyle  . Physical activity:    Days per week: Not on file    Minutes per session: Not on file  . Stress: Not on file  Relationships  . Social connections:    Talks on phone: Not on file    Gets together: Not on file    Attends religious service: Not on file    Active member of club or organization: Not on file    Attends meetings of clubs or organizations: Not on file    Relationship status: Not on file  Other Topics Concern  . Not on file  Social History Narrative   ** Merged History Encounter **       Outpatient Encounter Medications as of 01/28/2018  Medication Sig  . acetaminophen (TYLENOL) 325 MG tablet Take 650 mg by mouth  every 6 (six) hours as needed for mild pain or moderate pain.  . bimatoprost (LUMIGAN) 0.01 % SOLN Place 1 drop into both eyes at bedtime.  . carbamazepine (TEGRETOL) 200 MG tablet Take 200 mg by mouth 3 (three) times daily.  . clonazePAM (KLONOPIN) 0.5 MG tablet Take 0.5 mg by mouth 2 (two) times daily.   Marland Kitchen donepezil (ARICEPT) 10 MG tablet Take 10 mg by mouth at bedtime.  . folic acid (FOLVITE) 1 MG tablet Take 1 mg by mouth daily.  . insulin detemir (LEVEMIR) 100 UNIT/ML injection INJECT 25 UNITS SUBCUTANEOUSLY EACH MORNING.  . Insulin Syringe-Needle U-100 (INSULIN SYRINGE .5CC/31GX5/16") 31G X 5/16" 0.5 ML MISC 1 each by Does not apply route every morning.  Marland Kitchen lisinopril (PRINIVIL,ZESTRIL) 10 MG tablet Take 10 mg by mouth daily.  . memantine (NAMENDA) 10 MG tablet Take 10 mg by mouth 2 (two) times daily.  . metFORMIN (GLUCOPHAGE) 1000 MG tablet TAKE 1 TABLET BY MOUTH TWICE DAILY.  Marland Kitchen OLANZapine (ZYPREXA) 10 MG tablet Take 10 mg by mouth at bedtime.  Marland Kitchen omeprazole (PRILOSEC) 20 MG capsule Take 20 mg by mouth daily.  Marland Kitchen  oxybutynin (DITROPAN) 5 MG tablet Take 5 mg by mouth 2 (two) times daily.  . povidone-iodine (BETADINE) 10 % external solution Apply 1 application topically daily. Apply to skin between the 4th and 5th toes on both feet once daily  . senna (SENOKOT) 8.6 MG TABS tablet Take 1 tablet by mouth at bedtime.  . simvastatin (ZOCOR) 40 MG tablet Take 40 mg by mouth daily.  . tamsulosin (FLOMAX) 0.4 MG CAPS capsule Take 0.4 mg by mouth daily.  . Vitamin D, Ergocalciferol, (DRISDOL) 50000 units CAPS capsule TAKE 1 TABLET BY MOUTH ONCE WEEKLY.   No facility-administered encounter medications on file as of 01/28/2018.    ALLERGIES: No Known Allergies VACCINATION STATUS:  There is no immunization history on file for this patient.  Diabetes  He presents for his follow-up diabetic visit. He has type 2 diabetes mellitus. Onset time: he was diagnosed at age 55 yrs. His disease course has been  improving. There are no hypoglycemic associated symptoms. Pertinent negatives for hypoglycemia include no confusion, headaches, pallor or seizures. There are no diabetic associated symptoms. Pertinent negatives for diabetes include no chest pain, no fatigue, no polydipsia, no polyphagia, no polyuria and no weakness. There are no hypoglycemic complications. Symptoms are improving. There are no diabetic complications. Risk factors for coronary artery disease include diabetes mellitus, dyslipidemia, hypertension, male sex, obesity, sedentary lifestyle and tobacco exposure. Current diabetic treatment includes insulin injections and oral agent (monotherapy). He is compliant with treatment most of the time. His weight is stable. He is following a generally unhealthy diet. He never participates in exercise. His breakfast blood glucose range is generally 130-140 mg/dl. His dinner blood glucose range is generally 130-140 mg/dl. His overall blood glucose range is 130-140 mg/dl. An ACE inhibitor/angiotensin II receptor blocker is being taken.  Hyperlipidemia  This is a chronic problem. The current episode started more than 1 year ago. The problem is controlled. Exacerbating diseases include diabetes and obesity. Pertinent negatives include no chest pain, myalgias or shortness of breath. Current antihyperlipidemic treatment includes statins. Risk factors for coronary artery disease include diabetes mellitus, dyslipidemia, hypertension, male sex, obesity and a sedentary lifestyle.  Hypertension  This is a chronic problem. The current episode started more than 1 year ago. The problem is controlled. Pertinent negatives include no chest pain, headaches, neck pain, palpitations or shortness of breath. Risk factors for coronary artery disease include diabetes mellitus, dyslipidemia, smoking/tobacco exposure and obesity. Past treatments include ACE inhibitors.    Review of Systems  Constitutional: Negative for fatigue and  unexpected weight change.  HENT: Negative for dental problem, mouth sores and trouble swallowing.   Eyes: Negative for visual disturbance.  Respiratory: Negative for cough, choking, chest tightness, shortness of breath and wheezing.   Cardiovascular: Negative for chest pain, palpitations and leg swelling.  Gastrointestinal: Positive for constipation. Negative for abdominal distention, abdominal pain, diarrhea, nausea and vomiting.  Endocrine: Negative for polydipsia, polyphagia and polyuria.  Genitourinary: Negative for dysuria, flank pain, hematuria and urgency.  Musculoskeletal: Negative for back pain, gait problem, myalgias and neck pain.  Skin: Negative for pallor, rash and wound.  Neurological: Negative for seizures, syncope, weakness, numbness and headaches.  Psychiatric/Behavioral: Negative.  Negative for confusion and dysphoric mood.    Objective:    BP (!) 141/80   Pulse 79   Ht 5\' 8"  (1.727 m)   Wt 212 lb (96.2 kg)   BMI 32.23 kg/m   Wt Readings from Last 3 Encounters:  01/28/18 212 lb (96.2 kg)  09/11/17 212 lb (96.2 kg)  06/11/17 213 lb (96.6 kg)    Physical Exam  Constitutional: He is oriented to person, place, and time. He appears well-developed. He is cooperative. No distress.  HENT:  Head: Normocephalic and atraumatic.  Eyes: EOM are normal.  Neck: Normal range of motion. Neck supple. No tracheal deviation present. No thyromegaly present.  Cardiovascular: Normal rate, S1 normal and S2 normal. Exam reveals no gallop.  No murmur heard. Pulses:      Dorsalis pedis pulses are 1+ on the right side, and 1+ on the left side.       Posterior tibial pulses are 1+ on the right side, and 1+ on the left side.  Pulmonary/Chest: Effort normal. No respiratory distress. He has no wheezes.  Abdominal: He exhibits no distension. There is no tenderness. There is no guarding and no CVA tenderness.  Musculoskeletal: He exhibits no edema.       Right shoulder: He exhibits no  swelling and no deformity.  Neurological: He is alert and oriented to person, place, and time. He has normal strength and normal reflexes. No cranial nerve deficit or sensory deficit. Gait normal.  Skin: Skin is warm and dry. No rash noted. No cyanosis. Nails show no clubbing.  Psychiatric: He has a normal mood and affect. His speech is normal. Judgment normal. Cognition and memory are normal.    Recent Results (from the past 2160 hour(s))  COMPLETE METABOLIC PANEL WITH GFR     Status: Abnormal   Collection Time: 01/08/18  7:32 AM  Result Value Ref Range   Glucose, Bld 127 (H) 65 - 99 mg/dL    Comment: .            Fasting reference interval . For someone without known diabetes, a glucose value >125 mg/dL indicates that they may have diabetes and this should be confirmed with a follow-up test. .    BUN 22 7 - 25 mg/dL   Creat 1.190.86 1.470.70 - 8.291.18 mg/dL    Comment: For patients >75 years of age, the reference limit for Creatinine is approximately 13% higher for people identified as African-American. .    GFR, Est Non African American 85 > OR = 60 mL/min/1.1373m2   GFR, Est African American 98 > OR = 60 mL/min/1.5473m2   BUN/Creatinine Ratio NOT APPLICABLE 6 - 22 (calc)   Sodium 141 135 - 146 mmol/L   Potassium 4.5 3.5 - 5.3 mmol/L   Chloride 105 98 - 110 mmol/L   CO2 32 20 - 32 mmol/L   Calcium 9.1 8.6 - 10.3 mg/dL   Total Protein 5.9 (L) 6.1 - 8.1 g/dL   Albumin 3.8 3.6 - 5.1 g/dL   Globulin 2.1 1.9 - 3.7 g/dL (calc)   AG Ratio 1.8 1.0 - 2.5 (calc)   Total Bilirubin 0.2 0.2 - 1.2 mg/dL   Alkaline phosphatase (APISO) 92 40 - 115 U/L   AST 11 10 - 35 U/L   ALT 9 9 - 46 U/L  Hemoglobin A1c     Status: Abnormal   Collection Time: 01/08/18  7:32 AM  Result Value Ref Range   Hgb A1c MFr Bld 7.1 (H) <5.7 % of total Hgb    Comment: For someone without known diabetes, a hemoglobin A1c value of 6.5% or greater indicates that they may have  diabetes and this should be confirmed with a  follow-up  test. . For someone with known diabetes, a value <7% indicates  that their diabetes is  well controlled and a value  greater than or equal to 7% indicates suboptimal  control. A1c targets should be individualized based on  duration of diabetes, age, comorbid conditions, and  other considerations. . Currently, no consensus exists regarding use of hemoglobin A1c for diagnosis of diabetes for children. .    Mean Plasma Glucose 157 (calc)   eAG (mmol/L) 8.7 (calc)   Lipid Panel     Component Value Date/Time   CHOL 143 10/11/2016 0858   TRIG 91 10/11/2016 0858   HDL 50 10/11/2016 0858   CHOLHDL 2.9 10/11/2016 0858   VLDL 18 10/11/2016 0858   LDLCALC 75 10/11/2016 0858     Assessment & Plan:   1. Uncontrolled type 2 diabetes mellitus without complication, with long-term current use of insulin (HCC)  - He remains at a high risk for more acute and chronic complications of diabetes which include CAD, CVA, CKD, retinopathy, and neuropathy. These are all discussed in detail with the patient.  Patient came with improved glycemic profile and A1c of 7.1% from 7.5%.  Glucose logs and insulin administration records pertaining to this visit,  to be scanned into patient's records.  Recent labs reviewed.   - I have re-counseled the patient on diet management and weight loss  by adopting a carbohydrate restricted / protein rich  Diet.  -  Suggestion is made for him to avoid simple carbohydrates  from his diet including Cakes, Sweet Desserts / Pastries, Ice Cream, Soda (diet and regular), Sweet Tea, Candies, Chips, Cookies, Store Bought Juices, Alcohol in Excess of  1-2 drinks a day, Artificial Sweeteners, and "Sugar-free" Products. This will help patient to have stable blood glucose profile and potentially avoid unintended weight gain.  - Patient is advised to stick to a routine mealtimes to eat 3 meals  a day and avoid unnecessary snacks ( to snack only to correct  hypoglycemia).  - I have approached patient with the following individualized plan to manage diabetes and patient agrees.  -He would benefit from continued simplified treatment regimen.  I advised for his caretaker to continue Levemir 25  units every morning with breakfast, continue to monitor blood glucose twice a day-daily before breakfast and at bedtime.   His care taker is encouraged to call clinic if he has blood glucose levels less than 70 or above 300 mg /dl.  -He has normal renal function and tolerating metformin.  I have advised for continued metformin treatment 1000 mg p.o. twice daily after breakfast and supper.    - Patient specific target  for A1c; LDL, HDL, Triglycerides, and  Waist Circumference were discussed in detail.  2) BP/HTN: His blood pressure is above target.  He is advised to continue his blood pressure medications including lisinopril 10 mg p.o. Daily.  3) Lipids/HPL: His repeat lipid panel showed controlled LDL at 75.  He is advised to continue simvastatin 40 mg p.o. nightly.  4)  Weight/Diet: exercise, and carbohydrates information provided.  5) Chronic Care/Health Maintenance:  -Patient is  on ACEI/ARB and Statin medications and encouraged to continue to follow up with Ophthalmology, Podiatrist at least yearly or according to recommendations, and advised to quit  smoking. I have recommended yearly flu vaccine and pneumonia vaccination at least every 5 years; moderate intensity exercise for up to 150 minutes weekly; and  sleep for at least 7 hours a day.  - I advised patient to maintain close follow up with Kari Baars, MD for primary care needs.  - Time spent  with the patient: 25 min, of which >50% was spent in reviewing his blood glucose logs , discussing his hypo- and hyper-glycemic episodes, reviewing his current and  previous labs and insulin doses and developing a plan to avoid hypo- and hyper-glycemia. Please refer to Patient Instructions for Blood  Glucose Monitoring and Insulin/Medications Dosing Guide"  in media tab for additional information. Andre Rowe participated in the discussions, expressed understanding, and voiced agreement with the above plans.  All questions were answered to his satisfaction. he is encouraged to contact clinic should he have any questions or concerns prior to his return visit.  Follow up plan: -Return in about 4 months (around 05/30/2018) for follow up with pre-visit labs, meter, and logs.  Marquis Lunch, MD Phone: [phone]  Fax: [phone]  -  This note was partially dictated with voice recognition software. Similar sounding words can be transcribed inadequately or may not  be corrected upon review.  01/28/2018, 5:07 PM

## 2018-02-10 DIAGNOSIS — L851 Acquired keratosis [keratoderma] palmaris et plantaris: Secondary | ICD-10-CM | POA: Diagnosis not present

## 2018-02-10 DIAGNOSIS — E1342 Other specified diabetes mellitus with diabetic polyneuropathy: Secondary | ICD-10-CM | POA: Diagnosis not present

## 2018-02-10 DIAGNOSIS — B351 Tinea unguium: Secondary | ICD-10-CM | POA: Diagnosis not present

## 2018-02-20 DIAGNOSIS — E1165 Type 2 diabetes mellitus with hyperglycemia: Secondary | ICD-10-CM | POA: Diagnosis not present

## 2018-02-20 DIAGNOSIS — J449 Chronic obstructive pulmonary disease, unspecified: Secondary | ICD-10-CM | POA: Diagnosis not present

## 2018-02-20 DIAGNOSIS — G309 Alzheimer's disease, unspecified: Secondary | ICD-10-CM | POA: Diagnosis not present

## 2018-02-20 DIAGNOSIS — I10 Essential (primary) hypertension: Secondary | ICD-10-CM | POA: Diagnosis not present

## 2018-04-27 ENCOUNTER — Other Ambulatory Visit: Payer: Self-pay | Admitting: "Endocrinology

## 2018-04-28 DIAGNOSIS — B351 Tinea unguium: Secondary | ICD-10-CM | POA: Diagnosis not present

## 2018-04-28 DIAGNOSIS — L851 Acquired keratosis [keratoderma] palmaris et plantaris: Secondary | ICD-10-CM | POA: Diagnosis not present

## 2018-04-28 DIAGNOSIS — E1342 Other specified diabetes mellitus with diabetic polyneuropathy: Secondary | ICD-10-CM | POA: Diagnosis not present

## 2018-05-27 ENCOUNTER — Other Ambulatory Visit: Payer: Self-pay | Admitting: "Endocrinology

## 2018-06-01 DIAGNOSIS — Z794 Long term (current) use of insulin: Secondary | ICD-10-CM | POA: Diagnosis not present

## 2018-06-01 DIAGNOSIS — E1165 Type 2 diabetes mellitus with hyperglycemia: Secondary | ICD-10-CM | POA: Diagnosis not present

## 2018-06-01 DIAGNOSIS — E782 Mixed hyperlipidemia: Secondary | ICD-10-CM | POA: Diagnosis not present

## 2018-06-02 ENCOUNTER — Ambulatory Visit: Payer: Medicare Other | Admitting: "Endocrinology

## 2018-06-02 LAB — LIPID PANEL
Cholesterol: 161 mg/dL (ref <200)
HDL: 47 mg/dL (ref >40)
LDL Cholesterol (Calc): 97 mg/dL (calc)
Non-HDL Cholesterol (Calc): 114 mg/dL (calc) (ref <130)
Total CHOL/HDL Ratio: 3.4 (calc) (ref <5.0)
Triglycerides: 77 mg/dL (ref <150)

## 2018-06-02 LAB — COMPLETE METABOLIC PANEL WITH GFR
AG Ratio: 1.7 (calc) (ref 1.0–2.5)
ALKALINE PHOSPHATASE (APISO): 101 U/L (ref 40–115)
ALT: 11 U/L (ref 9–46)
AST: 10 U/L (ref 10–35)
Albumin: 4 g/dL (ref 3.6–5.1)
BILIRUBIN TOTAL: 0.3 mg/dL (ref 0.2–1.2)
BUN: 23 mg/dL (ref 7–25)
CHLORIDE: 106 mmol/L (ref 98–110)
CO2: 32 mmol/L (ref 20–32)
Calcium: 9.1 mg/dL (ref 8.6–10.3)
Creat: 0.94 mg/dL (ref 0.70–1.18)
GFR, Est African American: 92 mL/min/{1.73_m2} (ref >=60)
GFR, Est Non African American: 79 mL/min/{1.73_m2} (ref >=60)
GLUCOSE: 117 mg/dL — AB (ref 65–99)
Globulin: 2.4 g/dL (calc) (ref 1.9–3.7)
Potassium: 4.9 mmol/L (ref 3.5–5.3)
Sodium: 142 mmol/L (ref 135–146)
Total Protein: 6.4 g/dL (ref 6.1–8.1)

## 2018-06-02 LAB — MICROALBUMIN / CREATININE URINE RATIO
CREATININE, URINE: 79 mg/dL (ref 20–320)
MICROALB/CREAT RATIO: 9 ug/mg{creat} (ref <30)
Microalb, Ur: 0.7 mg/dL

## 2018-06-02 LAB — T4, FREE: FREE T4: 1 ng/dL (ref 0.8–1.8)

## 2018-06-02 LAB — HEMOGLOBIN A1C
Hgb A1c MFr Bld: 7 % of total Hgb — ABNORMAL HIGH (ref <5.7)
Mean Plasma Glucose: 154 (calc)
eAG (mmol/L): 8.5 (calc)

## 2018-06-02 LAB — TSH: TSH: 1.29 mIU/L (ref 0.40–4.50)

## 2018-06-16 ENCOUNTER — Ambulatory Visit (INDEPENDENT_AMBULATORY_CARE_PROVIDER_SITE_OTHER): Payer: Medicare Other | Admitting: "Endocrinology

## 2018-06-16 ENCOUNTER — Encounter: Payer: Self-pay | Admitting: "Endocrinology

## 2018-06-16 VITALS — BP 138/77 | HR 69 | Ht 68.0 in | Wt 216.0 lb

## 2018-06-16 DIAGNOSIS — E782 Mixed hyperlipidemia: Secondary | ICD-10-CM | POA: Diagnosis not present

## 2018-06-16 DIAGNOSIS — IMO0001 Reserved for inherently not codable concepts without codable children: Secondary | ICD-10-CM

## 2018-06-16 DIAGNOSIS — Z794 Long term (current) use of insulin: Secondary | ICD-10-CM | POA: Diagnosis not present

## 2018-06-16 DIAGNOSIS — F250 Schizoaffective disorder, bipolar type: Secondary | ICD-10-CM | POA: Diagnosis not present

## 2018-06-16 DIAGNOSIS — E1165 Type 2 diabetes mellitus with hyperglycemia: Secondary | ICD-10-CM | POA: Diagnosis not present

## 2018-06-16 DIAGNOSIS — I10 Essential (primary) hypertension: Secondary | ICD-10-CM | POA: Diagnosis not present

## 2018-06-16 NOTE — Patient Instructions (Signed)

## 2018-06-16 NOTE — Progress Notes (Signed)
Endocrinology follow-up note   Subjective:    Patient ID: Andre Rowe, male    DOB: 08-Aug-1941, PCP Kari Baars, MD   Past Medical History:  Diagnosis Date  . Alzheimer disease (HCC)   . BPH (benign prostatic hyperplasia)   . COPD (chronic obstructive pulmonary disease) (HCC)   . Dementia (HCC)   . Diabetes mellitus without complication (HCC)   . Diabetes mellitus, type II (HCC)   . GERD (gastroesophageal reflux disease)   . High cholesterol   . Hyperlipidemia   . Hypertension   . Overactive bladder   . Schizophrenia [facility])    Past Surgical History:  Procedure Laterality Date  . unable     Social History   Socioeconomic History  . Marital status: Single    Spouse name: Not on file  . Number of children: Not on file  . Years of education: Not on file  . Highest education level: Not on file  Occupational History  . Not on file  Social Needs  . Financial resource strain: Not on file  . Food insecurity:    Worry: Not on file    Inability: Not on file  . Transportation needs:    Medical: Not on file    Non-medical: Not on file  Tobacco Use  . Smoking status: Current Every Day Smoker    Packs/day: 0.50    Types: Cigarettes  . Smokeless tobacco: Never Used  Substance and Sexual Activity  . Alcohol use: No  . Drug use: No  . Sexual activity: Not on file  Lifestyle  . Physical activity:    Days per week: Not on file    Minutes per session: Not on file  . Stress: Not on file  Relationships  . Social connections:    Talks on phone: Not on file    Gets together: Not on file    Attends religious service: Not on file    Active member of club or organization: Not on file    Attends meetings of clubs or organizations: Not on file    Relationship status: Not on file  Other Topics Concern  . Not on file  Social History Narrative   ** Merged History Encounter **       Outpatient Encounter Medications as of 06/16/2018  Medication Sig  . acetaminophen  (TYLENOL) 325 MG tablet Take 650 mg by mouth every 6 (six) hours as needed for mild pain or moderate pain.  . bimatoprost (LUMIGAN) 0.01 % SOLN Place 1 drop into both eyes at bedtime.  . carbamazepine (TEGRETOL) 200 MG tablet Take 200 mg by mouth 3 (three) times daily.  . clonazePAM (KLONOPIN) 0.5 MG tablet Take 0.5 mg by mouth 2 (two) times daily.   Marland Kitchen donepezil (ARICEPT) 10 MG tablet Take 10 mg by mouth at bedtime.  . folic acid (FOLVITE) 1 MG tablet Take 1 mg by mouth daily.  . insulin detemir (LEVEMIR) 100 UNIT/ML injection INJECT 25 UNITS SUBCUTANEOUSLY EACH MORNING.  . Insulin Syringe-Needle U-100 (INSULIN SYRINGE .5CC/31GX5/16") 31G X 5/16" 0.5 ML MISC 1 each by Does not apply route every morning.  Marland Kitchen lisinopril (PRINIVIL,ZESTRIL) 10 MG tablet Take 10 mg by mouth daily.  . memantine (NAMENDA) 10 MG tablet Take 10 mg by mouth 2 (two) times daily.  . metFORMIN (GLUCOPHAGE) 1000 MG tablet TAKE 1 TABLET BY MOUTH TWICE DAILY.  Marland Kitchen OLANZapine (ZYPREXA) 10 MG tablet Take 10 mg by mouth at bedtime.  Marland Kitchen omeprazole (PRILOSEC) 20 MG capsule Take  20 mg by mouth daily.  Marland Kitchen oxybutynin (DITROPAN) 5 MG tablet Take 5 mg by mouth 2 (two) times daily.  . povidone-iodine (BETADINE) 10 % external solution Apply 1 application topically daily. Apply to skin between the 4th and 5th toes on both feet once daily  . senna (SENOKOT) 8.6 MG TABS tablet Take 1 tablet by mouth at bedtime.  . simvastatin (ZOCOR) 40 MG tablet Take 40 mg by mouth daily.  . tamsulosin (FLOMAX) 0.4 MG CAPS capsule Take 0.4 mg by mouth daily.  . Vitamin D, Ergocalciferol, (DRISDOL) 50000 units CAPS capsule TAKE 1 TABLET BY MOUTH ONCE WEEKLY.   No facility-administered encounter medications on file as of 06/16/2018.    ALLERGIES: No Known Allergies VACCINATION STATUS:  There is no immunization history on file for this patient.  Diabetes  He presents for his follow-up diabetic visit. He has type 2 diabetes mellitus. Onset time: he was  diagnosed at age 49 yrs. His disease course has been improving. There are no hypoglycemic associated symptoms. Pertinent negatives for hypoglycemia include no confusion, headaches, pallor or seizures. There are no diabetic associated symptoms. Pertinent negatives for diabetes include no chest pain, no fatigue, no polydipsia, no polyphagia, no polyuria and no weakness. There are no hypoglycemic complications. Symptoms are improving. There are no diabetic complications. Risk factors for coronary artery disease include diabetes mellitus, dyslipidemia, hypertension, male sex, obesity, sedentary lifestyle and tobacco exposure. Current diabetic treatment includes insulin injections. He is compliant with treatment most of the time. His weight is stable. He is following a generally unhealthy diet. He never participates in exercise. His breakfast blood glucose range is generally 130-140 mg/dl. His dinner blood glucose range is generally 130-140 mg/dl. His overall blood glucose range is 130-140 mg/dl. An ACE inhibitor/angiotensin II receptor blocker is being taken.  Hyperlipidemia  This is a chronic problem. The current episode started more than 1 year ago. The problem is controlled. Exacerbating diseases include diabetes and obesity. Pertinent negatives include no chest pain, myalgias or shortness of breath. Current antihyperlipidemic treatment includes statins. Risk factors for coronary artery disease include diabetes mellitus, dyslipidemia, hypertension, male sex, obesity and a sedentary lifestyle.  Hypertension  This is a chronic problem. The current episode started more than 1 year ago. The problem is controlled. Pertinent negatives include no chest pain, headaches, neck pain, palpitations or shortness of breath. Risk factors for coronary artery disease include diabetes mellitus, dyslipidemia, smoking/tobacco exposure and obesity. Past treatments include ACE inhibitors.    Review of Systems  Constitutional:  Negative for fatigue and unexpected weight change.  HENT: Negative for dental problem, mouth sores and trouble swallowing.   Eyes: Negative for visual disturbance.  Respiratory: Negative for cough, choking, chest tightness, shortness of breath and wheezing.   Cardiovascular: Negative for chest pain, palpitations and leg swelling.  Gastrointestinal: Positive for constipation. Negative for abdominal distention, abdominal pain, diarrhea, nausea and vomiting.  Endocrine: Negative for polydipsia, polyphagia and polyuria.  Genitourinary: Negative for dysuria, flank pain, hematuria and urgency.  Musculoskeletal: Negative for back pain, gait problem, myalgias and neck pain.  Skin: Negative for pallor, rash and wound.  Neurological: Negative for seizures, syncope, weakness, numbness and headaches.  Psychiatric/Behavioral: Negative.  Negative for confusion and dysphoric mood.    Objective:    BP 138/77   Pulse 69   Ht 5\' 8"  (1.727 m)   Wt 216 lb (98 kg)   BMI 32.84 kg/m   Wt Readings from Last 3 Encounters:  06/16/18 216 lb (  98 kg)  01/28/18 212 lb (96.2 kg)  09/11/17 212 lb (96.2 kg)    Physical Exam  Constitutional: He is oriented to person, place, and time. He appears well-developed. He is cooperative. No distress.  HENT:  Head: Normocephalic and atraumatic.  Eyes: EOM are normal.  Neck: Normal range of motion. Neck supple. No tracheal deviation present. No thyromegaly present.  Cardiovascular: Normal rate, S1 normal and S2 normal. Exam reveals no gallop.  No murmur heard. Pulses:      Dorsalis pedis pulses are 1+ on the right side, and 1+ on the left side.       Posterior tibial pulses are 1+ on the right side, and 1+ on the left side.  Pulmonary/Chest: Effort normal. No respiratory distress. He has no wheezes.  Abdominal: He exhibits no distension. There is no tenderness. There is no guarding and no CVA tenderness.  Musculoskeletal: He exhibits no edema.       Right shoulder: He  exhibits no swelling and no deformity.  Neurological: He is alert and oriented to person, place, and time. He has normal strength and normal reflexes. No cranial nerve deficit or sensory deficit. Gait normal.  Skin: Skin is warm and dry. No rash noted. No cyanosis. Nails show no clubbing.  Psychiatric: He has a normal mood and affect. His speech is normal. Judgment normal. Cognition and memory are normal.    Recent Results (from the past 2160 hour(s))  Hemoglobin A1c     Status: Abnormal   Collection Time: 06/01/18  9:40 AM  Result Value Ref Range   Hgb A1c MFr Bld 7.0 (H) <5.7 % of total Hgb    Comment: For someone without known diabetes, a hemoglobin A1c value of 6.5% or greater indicates that they may have  diabetes and this should be confirmed with a follow-up  test. . For someone with known diabetes, a value <7% indicates  that their diabetes is well controlled and a value  greater than or equal to 7% indicates suboptimal  control. A1c targets should be individualized based on  duration of diabetes, age, comorbid conditions, and  other considerations. . Currently, no consensus exists regarding use of hemoglobin A1c for diagnosis of diabetes for children. .    Mean Plasma Glucose 154 (calc)   eAG (mmol/L) 8.5 (calc)  COMPLETE METABOLIC PANEL WITH GFR     Status: Abnormal   Collection Time: 06/01/18  9:40 AM  Result Value Ref Range   Glucose, Bld 117 (H) 65 - 99 mg/dL    Comment: .            Fasting reference interval . For someone without known diabetes, a glucose value between 100 and 125 mg/dL is consistent with prediabetes and should be confirmed with a follow-up test. .    BUN 23 7 - 25 mg/dL   Creat 0.98 1.19 - 1.47 mg/dL    Comment: For patients >11 years of age, the reference limit for Creatinine is approximately 13% higher for people identified as African-American. .    GFR, Est Non African American 79 > OR = 60 mL/min/1.93m2   GFR, Est African American  92 > OR = 60 mL/min/1.26m2   BUN/Creatinine Ratio NOT APPLICABLE 6 - 22 (calc)   Sodium 142 135 - 146 mmol/L   Potassium 4.9 3.5 - 5.3 mmol/L   Chloride 106 98 - 110 mmol/L   CO2 32 20 - 32 mmol/L   Calcium 9.1 8.6 - 10.3 mg/dL   Total  Protein 6.4 6.1 - 8.1 g/dL   Albumin 4.0 3.6 - 5.1 g/dL   Globulin 2.4 1.9 - 3.7 g/dL (calc)   AG Ratio 1.7 1.0 - 2.5 (calc)   Total Bilirubin 0.3 0.2 - 1.2 mg/dL   Alkaline phosphatase (APISO) 101 40 - 115 U/L   AST 10 10 - 35 U/L   ALT 11 9 - 46 U/L  Microalbumin / creatinine urine ratio     Status: None   Collection Time: 06/01/18  9:40 AM  Result Value Ref Range   Creatinine, Urine 79 20 - 320 mg/dL   Microalb, Ur 0.7 mg/dL    Comment: Reference Range Not established    Microalb Creat Ratio 9 <30 mcg/mg creat    Comment: . The ADA defines abnormalities in albumin excretion as follows: Marland Kitchen Category         Result (mcg/mg creatinine) . Normal                    <30 Microalbuminuria         30-299  Clinical albuminuria   > OR = 300 . The ADA recommends that at least two of three specimens collected within a 3-6 month period be abnormal before considering a patient to be within a diagnostic category.   TSH     Status: None   Collection Time: 06/01/18  9:40 AM  Result Value Ref Range   TSH 1.29 0.40 - 4.50 mIU/L  T4, free     Status: None   Collection Time: 06/01/18  9:40 AM  Result Value Ref Range   Free T4 1.0 0.8 - 1.8 ng/dL  Lipid panel     Status: None   Collection Time: 06/01/18  9:40 AM  Result Value Ref Range   Cholesterol 161 <200 mg/dL   HDL 47 >54 mg/dL   Triglycerides 77 <098 mg/dL   LDL Cholesterol (Calc) 97 mg/dL (calc)    Comment: Reference range: <100 . Desirable range <100 mg/dL for primary prevention;   <70 mg/dL for patients with CHD or diabetic patients  with > or = 2 CHD risk factors. Marland Kitchen LDL-C is now calculated using the Martin-Hopkins  calculation, which is a validated novel method providing  better  accuracy than the Friedewald equation in the  estimation of LDL-C.  Horald Pollen et al. Lenox Ahr. 1191;478(29): 2061-2068  (http://education.[facility].com/faq/FAQ164)    Total CHOL/HDL Ratio 3.4 <5.0 (calc)   Non-HDL Cholesterol (Calc) 114 <130 mg/dL (calc)    Comment: For patients with diabetes plus 1 major ASCVD risk  factor, treating to a non-HDL-C goal of <100 mg/dL  (LDL-C of <56 mg/dL) is considered a therapeutic  option.    Lipid Panel     Component Value Date/Time   CHOL 161 06/01/2018 0940   TRIG 77 06/01/2018 0940   HDL 47 06/01/2018 0940   CHOLHDL 3.4 06/01/2018 0940   VLDL 18 10/11/2016 0858   LDLCALC 97 06/01/2018 0940     Assessment & Plan:   1. Uncontrolled type 2 diabetes mellitus without complication, with long-term current use of insulin (HCC)  - He remains at a high risk for more acute and chronic complications of diabetes which include CAD, CVA, CKD, retinopathy, and neuropathy. These are all discussed in detail with the patient.  Patient came with stable A1c of 7%.   Glucose logs and insulin administration records pertaining to this visit,  to be scanned into patient's records.  Recent labs reviewed.   - I have  re-counseled the patient on diet management and weight loss  by adopting a carbohydrate restricted / protein rich  Diet.  -He is accompanied by his caretaker from his group home. -Suggestion is made for him to avoid simple carbohydrates  from his diet including Cakes, Sweet Desserts / Pastries, Ice Cream, Soda (diet and regular), Sweet Tea, Candies, Chips, Cookies, Store Bought Juices, Alcohol in Excess of  1-2 drinks a day, Artificial Sweeteners, and "Sugar-free" Products. This will help patient to have stable blood glucose profile and potentially avoid unintended weight gain.  - Patient is advised to stick to a routine mealtimes to eat 3 meals  a day and avoid unnecessary snacks ( to snack only to correct hypoglycemia).  - I have approached  patient with the following individualized plan to manage diabetes and patient agrees.  -He will benefit from continued simplified treatment regimen.  I advised for his caretaker to continue Levemir 25 minutes every morning with breakfast, continue monitoring blood glucose twice a day-daily before breakfast and before supper. His care taker is encouraged to call clinic if he has blood glucose levels less than 70 or above 300 mg /dl.  -He has normal renal function and tolerating metformin.  I have advised on metformin 1000 mg p.o. twice daily treatment to be continued.    - Patient specific target  for A1c; LDL, HDL, Triglycerides, and  Waist Circumference were discussed in detail.  2) BP/HTN: His blood pressure is controlled to target.  He is advised to continue his blood pressure medications including lisinopril 10 mg p.o. Daily.  3) Lipids/HPL: His repeat lipid panel showed controlled LDL at 75.  He is advised to continue simvastatin 40 mg p.o. nightly.     4)  Weight/Diet: exercise, and carbohydrates information provided.  5) Chronic Care/Health Maintenance:  -Patient is  on ACEI/ARB and Statin medications and encouraged to continue to follow up with Ophthalmology, Podiatrist at least yearly or according to recommendations, and advised to quit  smoking. I have recommended yearly flu vaccine and pneumonia vaccination at least every 5 years; moderate intensity exercise for up to 150 minutes weekly; and  sleep for at least 7 hours a day.  - I advised patient to maintain close follow up with Kari Baars, MD for primary care needs.  - Time spent with the patient: 25 min, of which >50% was spent in reviewing his blood glucose logs , discussing his hypo- and hyper-glycemic episodes, reviewing his current and  previous labs and insulin doses and developing a plan to avoid hypo- and hyper-glycemia. Please refer to Patient Instructions for Blood Glucose Monitoring and Insulin/Medications Dosing  Guide"  in media tab for additional information. Isaiah Serge participated in the discussions, expressed understanding, and voiced agreement with the above plans.  All questions were answered to his satisfaction. he is encouraged to contact clinic should he have any questions or concerns prior to his return visit.   Follow up plan: -Return in about 4 months (around 10/17/2018) for Follow up with Pre-visit Labs, Meter, and Logs.  Marquis Lunch, MD Phone: [phone]  Fax: [phone]  -  This note was partially dictated with voice recognition software. Similar sounding words can be transcribed inadequately or may not  be corrected upon review.  06/16/2018, 4:16 PM

## 2018-07-07 DIAGNOSIS — B351 Tinea unguium: Secondary | ICD-10-CM | POA: Diagnosis not present

## 2018-07-07 DIAGNOSIS — L851 Acquired keratosis [keratoderma] palmaris et plantaris: Secondary | ICD-10-CM | POA: Diagnosis not present

## 2018-07-07 DIAGNOSIS — E1342 Other specified diabetes mellitus with diabetic polyneuropathy: Secondary | ICD-10-CM | POA: Diagnosis not present

## 2018-08-26 ENCOUNTER — Other Ambulatory Visit: Payer: Self-pay | Admitting: "Endocrinology

## 2018-08-28 ENCOUNTER — Other Ambulatory Visit: Payer: Self-pay | Admitting: "Endocrinology

## 2018-09-15 DIAGNOSIS — L851 Acquired keratosis [keratoderma] palmaris et plantaris: Secondary | ICD-10-CM | POA: Diagnosis not present

## 2018-09-15 DIAGNOSIS — E1342 Other specified diabetes mellitus with diabetic polyneuropathy: Secondary | ICD-10-CM | POA: Diagnosis not present

## 2018-09-15 DIAGNOSIS — B351 Tinea unguium: Secondary | ICD-10-CM | POA: Diagnosis not present

## 2018-09-23 DIAGNOSIS — I10 Essential (primary) hypertension: Secondary | ICD-10-CM | POA: Diagnosis not present

## 2018-09-23 DIAGNOSIS — I209 Angina pectoris, unspecified: Secondary | ICD-10-CM | POA: Diagnosis not present

## 2018-09-23 DIAGNOSIS — J449 Chronic obstructive pulmonary disease, unspecified: Secondary | ICD-10-CM | POA: Diagnosis not present

## 2018-09-23 DIAGNOSIS — E119 Type 2 diabetes mellitus without complications: Secondary | ICD-10-CM | POA: Diagnosis not present

## 2018-10-15 DIAGNOSIS — Z794 Long term (current) use of insulin: Secondary | ICD-10-CM | POA: Diagnosis not present

## 2018-10-15 DIAGNOSIS — E1165 Type 2 diabetes mellitus with hyperglycemia: Secondary | ICD-10-CM | POA: Diagnosis not present

## 2018-10-16 LAB — COMPLETE METABOLIC PANEL WITH GFR
AG Ratio: 1.5 (calc) (ref 1.0–2.5)
ALBUMIN MSPROF: 3.7 g/dL (ref 3.6–5.1)
ALT: 11 U/L (ref 9–46)
AST: 11 U/L (ref 10–35)
Alkaline phosphatase (APISO): 100 U/L (ref 35–144)
BUN / CREAT RATIO: 24 (calc) — AB (ref 6–22)
BUN: 26 mg/dL — AB (ref 7–25)
CALCIUM: 9.2 mg/dL (ref 8.6–10.3)
CO2: 33 mmol/L — AB (ref 20–32)
CREATININE: 1.09 mg/dL (ref 0.70–1.18)
Chloride: 106 mmol/L (ref 98–110)
GFR, EST AFRICAN AMERICAN: 77 mL/min/{1.73_m2} (ref >=60)
GFR, EST NON AFRICAN AMERICAN: 66 mL/min/{1.73_m2} (ref >=60)
Globulin: 2.5 g/dL (calc) (ref 1.9–3.7)
Glucose, Bld: 115 mg/dL — ABNORMAL HIGH (ref 65–99)
Potassium: 4.5 mmol/L (ref 3.5–5.3)
Sodium: 144 mmol/L (ref 135–146)
TOTAL PROTEIN: 6.2 g/dL (ref 6.1–8.1)
Total Bilirubin: 0.2 mg/dL (ref 0.2–1.2)

## 2018-10-16 LAB — HEMOGLOBIN A1C
HEMOGLOBIN A1C: 7.3 %{Hb} — AB (ref <5.7)
Mean Plasma Glucose: 163 (calc)
eAG (mmol/L): 9 (calc)

## 2018-10-19 ENCOUNTER — Ambulatory Visit: Payer: Medicare Other | Admitting: "Endocrinology

## 2018-11-02 ENCOUNTER — Other Ambulatory Visit: Payer: Self-pay | Admitting: "Endocrinology

## 2018-11-10 ENCOUNTER — Telehealth (INDEPENDENT_AMBULATORY_CARE_PROVIDER_SITE_OTHER): Payer: Medicare Other | Admitting: "Endocrinology

## 2018-11-10 DIAGNOSIS — IMO0001 Reserved for inherently not codable concepts without codable children: Secondary | ICD-10-CM

## 2018-11-10 DIAGNOSIS — E1165 Type 2 diabetes mellitus with hyperglycemia: Secondary | ICD-10-CM | POA: Diagnosis not present

## 2018-11-10 DIAGNOSIS — E782 Mixed hyperlipidemia: Secondary | ICD-10-CM

## 2018-11-10 DIAGNOSIS — Z794 Long term (current) use of insulin: Secondary | ICD-10-CM

## 2018-11-10 NOTE — Patient Instructions (Signed)
Endocrinology Telephone Visit Follow up Note -During COVID -19 Pandemic    Subjective:    Patient ID: Andre Rowe, male    DOB: 1941-05-02, PCP Kari Baars, MD   Past Medical History:  Diagnosis Date  . Alzheimer disease (HCC)   . BPH (benign prostatic hyperplasia)   . COPD (chronic obstructive pulmonary disease) (HCC)   . Dementia (HCC)   . Diabetes mellitus without complication (HCC)   . Diabetes mellitus, type II (HCC)   . GERD (gastroesophageal reflux disease)   . High cholesterol   . Hyperlipidemia   . Hypertension   . Overactive bladder   . Schizophrenia [facility])    Past Surgical History:  Procedure Laterality Date  . unable     Social History   Socioeconomic History  . Marital status: Single    Spouse name: Not on file  . Number of children: Not on file  . Years of education: Not on file  . Highest education level: Not on file  Occupational History  . Not on file  Social Needs  . Financial resource strain: Not on file  . Food insecurity:    Worry: Not on file    Inability: Not on file  . Transportation needs:    Medical: Not on file    Non-medical: Not on file  Tobacco Use  . Smoking status: Current Every Day Smoker    Packs/day: 0.50    Types: Cigarettes  . Smokeless tobacco: Never Used  Substance and Sexual Activity  . Alcohol use: No  . Drug use: No  . Sexual activity: Not on file  Lifestyle  . Physical activity:    Days per week: Not on file    Minutes per session: Not on file  . Stress: Not on file  Relationships  . Social connections:    Talks on phone: Not on file    Gets together: Not on file    Attends religious service: Not on file    Active member of club or organization: Not on file    Attends meetings of clubs or organizations: Not on file    Relationship status: Not on file  Other Topics Concern  . Not on file  Social History Narrative   ** Merged History Encounter  **       Outpatient Encounter Medications as of 11/10/2018  Medication Sig  . acetaminophen (TYLENOL) 325 MG tablet Take 650 mg by mouth every 6 (six) hours as needed for mild pain or moderate pain.  . bimatoprost (LUMIGAN) 0.01 % SOLN Place 1 drop into both eyes at bedtime.  . carbamazepine (TEGRETOL) 200 MG tablet Take 200 mg by mouth 3 (three) times daily.  . clonazePAM (KLONOPIN) 0.5 MG tablet Take 0.5 mg by mouth 2 (two) times daily.   Marland Kitchen donepezil (ARICEPT) 10 MG tablet Take 10 mg by mouth at bedtime.  . folic acid (FOLVITE) 1 MG tablet Take 1 mg by mouth daily.  . insulin detemir (LEVEMIR) 100 UNIT/ML injection INJECT 25 UNITS SUBCUTANEOUSLY EACH MORNING.  . Insulin Syringe-Needle U-100 (INSULIN SYRINGE .5CC/31GX5/16") 31G X 5/16" 0.5 ML MISC 1 each by Does not apply route every morning.  Marland Kitchen lisinopril (PRINIVIL,ZESTRIL) 10 MG  tablet Take 10 mg by mouth daily.  . memantine (NAMENDA) 10 MG tablet Take 10 mg by mouth 2 (two) times daily.  . metFORMIN (GLUCOPHAGE) 1000 MG tablet TAKE 1 TABLET BY MOUTH TWICE DAILY.  Marland Kitchen OLANZapine (ZYPREXA) 10 MG tablet Take 10 mg by mouth at bedtime.  Marland Kitchen omeprazole (PRILOSEC) 20 MG capsule Take 20 mg by mouth daily.  Marland Kitchen oxybutynin (DITROPAN) 5 MG tablet Take 5 mg by mouth 2 (two) times daily.  . povidone-iodine (BETADINE) 10 % external solution Apply 1 application topically daily. Apply to skin between the 4th and 5th toes on both feet once daily  . senna (SENOKOT) 8.6 MG TABS tablet Take 1 tablet by mouth at bedtime.  . simvastatin (ZOCOR) 40 MG tablet Take 40 mg by mouth daily.  . tamsulosin (FLOMAX) 0.4 MG CAPS capsule Take 0.4 mg by mouth daily.  . Vitamin D, Ergocalciferol, (DRISDOL) 1.25 MG (50000 UT) CAPS capsule TAKE 1 TABLET BY MOUTH ONCE WEEKLY.   No facility-administered encounter medications on file as of 11/10/2018.    ALLERGIES: No Known Allergies VACCINATION STATUS:  There is no immunization history on file for this patient.  Diabetes  He  presents for his follow-up diabetic visit. He has type 2 diabetes mellitus. Onset time: he was diagnosed at age 38 yrs. His disease course has been stable.  His previsit labs show A1c of 7.3%.  His glycemic profile are near target both fasting and postprandial.  No reported hypoglycemia from discussion with his caretaker.  He continues to have good appetite. Hyperlipidemia  This is a chronic problem. The current episode started more than 1 year ago. The problem is controlled. Exacerbating diseases include diabetes and obesity. Pertinent negatives include no chest pain, myalgias or shortness of breath. Current antihyperlipidemic treatment includes statins. Risk factors for coronary artery disease include diabetes mellitus, dyslipidemia, hypertension, male sex, obesity and a sedentary lifestyle.     Objective:       Recent Results (from the past 2160 hour(s))  Hemoglobin A1c     Status: Abnormal   Collection Time: 10/15/18  8:35 AM  Result Value Ref Range   Hgb A1c MFr Bld 7.3 (H) <5.7 % of total Hgb    Comment: For someone without known diabetes, a hemoglobin A1c value of 6.5% or greater indicates that they may have  diabetes and this should be confirmed with a follow-up  test. . For someone with known diabetes, a value <7% indicates  that their diabetes is well controlled and a value  greater than or equal to 7% indicates suboptimal  control. A1c targets should be individualized based on  duration of diabetes, age, comorbid conditions, and  other considerations. . Currently, no consensus exists regarding use of hemoglobin A1c for diagnosis of diabetes for children. .    Mean Plasma Glucose 163 (calc)   eAG (mmol/L) 9.0 (calc)  COMPLETE METABOLIC PANEL WITH GFR     Status: Abnormal   Collection Time: 10/15/18  8:35 AM  Result Value Ref Range   Glucose, Bld 115 (H) 65 - 99 mg/dL    Comment: .            Fasting reference interval . For someone without known diabetes, a glucose  value between 100 and 125 mg/dL is consistent with prediabetes and should be confirmed with a follow-up test. .    BUN 26 (H) 7 - 25 mg/dL   Creat 3.32 9.51 - 8.84 mg/dL    Comment: For patients >49  years of age, the reference limit for Creatinine is approximately 13% higher for people identified as African-American. .    GFR, Est Non African American 66 > OR = 60 mL/min/1.58m2   GFR, Est African American 77 > OR = 60 mL/min/1.58m2   BUN/Creatinine Ratio 24 (H) 6 - 22 (calc)   Sodium 144 135 - 146 mmol/L   Potassium 4.5 3.5 - 5.3 mmol/L   Chloride 106 98 - 110 mmol/L   CO2 33 (H) 20 - 32 mmol/L   Calcium 9.2 8.6 - 10.3 mg/dL   Total Protein 6.2 6.1 - 8.1 g/dL   Albumin 3.7 3.6 - 5.1 g/dL   Globulin 2.5 1.9 - 3.7 g/dL (calc)   AG Ratio 1.5 1.0 - 2.5 (calc)   Total Bilirubin 0.2 0.2 - 1.2 mg/dL   Alkaline phosphatase (APISO) 100 35 - 144 U/L   AST 11 10 - 35 U/L   ALT 11 9 - 46 U/L   Lipid Panel     Component Value Date/Time   CHOL 161 06/01/2018 0940   TRIG 77 06/01/2018 0940   HDL 47 06/01/2018 0940   CHOLHDL 3.4 06/01/2018 0940   VLDL 18 10/11/2016 0858   LDLCALC 97 06/01/2018 0940     Assessment & Plan:   1. Uncontrolled type 2 diabetes mellitus without complication, with long-term current use of insulin (HCC) -This is a telephone visit due to coronavirus pandemic. - He remains at a high risk for more acute and chronic complications of diabetes which include CAD, CVA, CKD, retinopathy, and neuropathy. These are all discussed in detail with the patient.  His previsit labs show A1c of 7.3%, acceptable profile.  - I have re-counseled the patient on diet management and weight loss  by adopting a carbohydrate restricted / protein rich  Diet.   -Suggestion is made for him to avoid simple carbohydrates  from his diet including Cakes, Sweet Desserts / Pastries, Ice Cream, Soda (diet and regular), Sweet Tea, Candies, Chips, Cookies, Store Bought Juices, Alcohol in Excess  of  1-2 drinks a day, Artificial Sweeteners, and "Sugar-free" Products. This will help patient to have stable blood glucose profile and potentially avoid unintended weight gain.  - Patient is advised to stick to a routine mealtimes to eat 3 meals  a day and avoid unnecessary snacks ( to snack only to correct hypoglycemia).  - I have approached patient with the following individualized plan to manage diabetes and patient agrees.  -He will benefit from continued simplified treatment regimen.  I advised for his caretaker to continue Levemir 25 units every morning with breakfast, associated with strict monitoring of blood glucose 2 times a day-daily before breakfast and before supper.  He will continue to benefit from metformin therapy, advised to continue metformin 1000 mg p.o. daily after breakfast and after supper.    2) Lipids/HPL: His repeat lipid panel showed controlled LDL at 75.  I advised to continue simvastatin 40 mg p.o. nightly for him.      - I advised patient to maintain close follow up with Kari Baars, MD for primary care needs. - Patient Care Time Today:  25 min, of which >50% was spent in reviewing his  current and  previous labs/studies, previous treatments, and medications doses and developing a plan for long-term care based on the latest recommendations for standards of care.  Windsor Proffit's and caretaker participated in the discussions, expressed understanding, and voiced agreement with the above plans.  All questions were answered to  his satisfaction. he is encouraged to contact clinic should he have any questions or concerns prior to his return visit.   Follow up plan: -Return in 3 months with repeat labs, meter/logs. Marquis Lunch, MD Phone: [phone]  Fax: [phone]  -  This note was partially dictated with voice recognition software. Similar sounding words can be transcribed inadequately or may not  be corrected upon review.  11/10/2018, 11:42 AM

## 2018-11-10 NOTE — Progress Notes (Signed)
                                                    Endocrinology Telephone Visit Follow up Note -During COVID -19 Pandemic    Subjective:    Patient ID: Andre Rowe, male    DOB: 11/02/1940, PCP Hawkins, Edward, MD   Past Medical History:  Diagnosis Date  . Alzheimer disease (HCC)   . BPH (benign prostatic hyperplasia)   . COPD (chronic obstructive pulmonary disease) (HCC)   . Dementia (HCC)   . Diabetes mellitus without complication (HCC)   . Diabetes mellitus, type II (HCC)   . GERD (gastroesophageal reflux disease)   . High cholesterol   . Hyperlipidemia   . Hypertension   . Overactive bladder   . Schizophrenia (HCC)    Past Surgical History:  Procedure Laterality Date  . unable     Social History   Socioeconomic History  . Marital status: Single    Spouse name: Not on file  . Number of children: Not on file  . Years of education: Not on file  . Highest education level: Not on file  Occupational History  . Not on file  Social Needs  . Financial resource strain: Not on file  . Food insecurity:    Worry: Not on file    Inability: Not on file  . Transportation needs:    Medical: Not on file    Non-medical: Not on file  Tobacco Use  . Smoking status: Current Every Day Smoker    Packs/day: 0.50    Types: Cigarettes  . Smokeless tobacco: Never Used  Substance and Sexual Activity  . Alcohol use: No  . Drug use: No  . Sexual activity: Not on file  Lifestyle  . Physical activity:    Days per week: Not on file    Minutes per session: Not on file  . Stress: Not on file  Relationships  . Social connections:    Talks on phone: Not on file    Gets together: Not on file    Attends religious service: Not on file    Active member of club or organization: Not on file    Attends meetings of clubs or organizations: Not on file    Relationship status: Not on file  Other Topics Concern  . Not on file  Social History Narrative   ** Merged History Encounter  **       Outpatient Encounter Medications as of 11/10/2018  Medication Sig  . acetaminophen (TYLENOL) 325 MG tablet Take 650 mg by mouth every 6 (six) hours as needed for mild pain or moderate pain.  . bimatoprost (LUMIGAN) 0.01 % SOLN Place 1 drop into both eyes at bedtime.  . carbamazepine (TEGRETOL) 200 MG tablet Take 200 mg by mouth 3 (three) times daily.  . clonazePAM (KLONOPIN) 0.5 MG tablet Take 0.5 mg by mouth 2 (two) times daily.   . donepezil (ARICEPT) 10 MG tablet Take 10 mg by mouth at bedtime.  . folic acid (FOLVITE) 1 MG tablet Take 1 mg by mouth daily.  . insulin detemir (LEVEMIR) 100 UNIT/ML injection INJECT 25 UNITS SUBCUTANEOUSLY EACH MORNING.  . Insulin Syringe-Needle U-100 (INSULIN SYRINGE .5CC/31GX5/16") 31G X 5/16" 0.5 ML MISC 1 each by Does not apply route every morning.  . lisinopril (PRINIVIL,ZESTRIL) 10 MG   tablet Take 10 mg by mouth daily.  . memantine (NAMENDA) 10 MG tablet Take 10 mg by mouth 2 (two) times daily.  . metFORMIN (GLUCOPHAGE) 1000 MG tablet TAKE 1 TABLET BY MOUTH TWICE DAILY.  . OLANZapine (ZYPREXA) 10 MG tablet Take 10 mg by mouth at bedtime.  . omeprazole (PRILOSEC) 20 MG capsule Take 20 mg by mouth daily.  . oxybutynin (DITROPAN) 5 MG tablet Take 5 mg by mouth 2 (two) times daily.  . povidone-iodine (BETADINE) 10 % external solution Apply 1 application topically daily. Apply to skin between the 4th and 5th toes on both feet once daily  . senna (SENOKOT) 8.6 MG TABS tablet Take 1 tablet by mouth at bedtime.  . simvastatin (ZOCOR) 40 MG tablet Take 40 mg by mouth daily.  . tamsulosin (FLOMAX) 0.4 MG CAPS capsule Take 0.4 mg by mouth daily.  . Vitamin D, Ergocalciferol, (DRISDOL) 1.25 MG (50000 UT) CAPS capsule TAKE 1 TABLET BY MOUTH ONCE WEEKLY.   No facility-administered encounter medications on file as of 11/10/2018.    ALLERGIES: No Known Allergies VACCINATION STATUS:  There is no immunization history on file for this patient.  Diabetes  He  presents for his follow-up diabetic visit. He has type 2 diabetes mellitus. Onset time: he was diagnosed at age 67 yrs. His disease course has been stable.  His previsit labs show A1c of 7.3%.  His glycemic profile are near target both fasting and postprandial.  No reported hypoglycemia from discussion with his caretaker.  He continues to have good appetite. Hyperlipidemia  This is a chronic problem. The current episode started more than 1 year ago. The problem is controlled. Exacerbating diseases include diabetes and obesity. Pertinent negatives include no chest pain, myalgias or shortness of breath. Current antihyperlipidemic treatment includes statins. Risk factors for coronary artery disease include diabetes mellitus, dyslipidemia, hypertension, male sex, obesity and a sedentary lifestyle.     Objective:       Recent Results (from the past 2160 hour(s))  Hemoglobin A1c     Status: Abnormal   Collection Time: 10/15/18  8:35 AM  Result Value Ref Range   Hgb A1c MFr Bld 7.3 (H) <5.7 % of total Hgb    Comment: For someone without known diabetes, a hemoglobin A1c value of 6.5% or greater indicates that they may have  diabetes and this should be confirmed with a follow-up  test. . For someone with known diabetes, a value <7% indicates  that their diabetes is well controlled and a value  greater than or equal to 7% indicates suboptimal  control. A1c targets should be individualized based on  duration of diabetes, age, comorbid conditions, and  other considerations. . Currently, no consensus exists regarding use of hemoglobin A1c for diagnosis of diabetes for children. .    Mean Plasma Glucose 163 (calc)   eAG (mmol/L) 9.0 (calc)  COMPLETE METABOLIC PANEL WITH GFR     Status: Abnormal   Collection Time: 10/15/18  8:35 AM  Result Value Ref Range   Glucose, Bld 115 (H) 65 - 99 mg/dL    Comment: .            Fasting reference interval . For someone without known diabetes, a glucose  value between 100 and 125 mg/dL is consistent with prediabetes and should be confirmed with a follow-up test. .    BUN 26 (H) 7 - 25 mg/dL   Creat 1.09 0.70 - 1.18 mg/dL    Comment: For patients >49   years of age, the reference limit for Creatinine is approximately 13% higher for people identified as African-American. .    GFR, Est Non African American 66 > OR = 60 mL/min/1.73m2   GFR, Est African American 77 > OR = 60 mL/min/1.73m2   BUN/Creatinine Ratio 24 (H) 6 - 22 (calc)   Sodium 144 135 - 146 mmol/L   Potassium 4.5 3.5 - 5.3 mmol/L   Chloride 106 98 - 110 mmol/L   CO2 33 (H) 20 - 32 mmol/L   Calcium 9.2 8.6 - 10.3 mg/dL   Total Protein 6.2 6.1 - 8.1 g/dL   Albumin 3.7 3.6 - 5.1 g/dL   Globulin 2.5 1.9 - 3.7 g/dL (calc)   AG Ratio 1.5 1.0 - 2.5 (calc)   Total Bilirubin 0.2 0.2 - 1.2 mg/dL   Alkaline phosphatase (APISO) 100 35 - 144 U/L   AST 11 10 - 35 U/L   ALT 11 9 - 46 U/L   Lipid Panel     Component Value Date/Time   CHOL 161 06/01/2018 0940   TRIG 77 06/01/2018 0940   HDL 47 06/01/2018 0940   CHOLHDL 3.4 06/01/2018 0940   VLDL 18 10/11/2016 0858   LDLCALC 97 06/01/2018 0940     Assessment & Plan:   1. Uncontrolled type 2 diabetes mellitus without complication, with long-term current use of insulin (HCC) -This is a telephone visit due to coronavirus pandemic. - He remains at a high risk for more acute and chronic complications of diabetes which include CAD, CVA, CKD, retinopathy, and neuropathy. These are all discussed in detail with the patient.  His previsit labs show A1c of 7.3%, acceptable profile.  - I have re-counseled the patient on diet management and weight loss  by adopting a carbohydrate restricted / protein rich  Diet.   -Suggestion is made for him to avoid simple carbohydrates  from his diet including Cakes, Sweet Desserts / Pastries, Ice Cream, Soda (diet and regular), Sweet Tea, Candies, Chips, Cookies, Store Bought Juices, Alcohol in Excess  of  1-2 drinks a day, Artificial Sweeteners, and "Sugar-free" Products. This will help patient to have stable blood glucose profile and potentially avoid unintended weight gain.  - Patient is advised to stick to a routine mealtimes to eat 3 meals  a day and avoid unnecessary snacks ( to snack only to correct hypoglycemia).  - I have approached patient with the following individualized plan to manage diabetes and patient agrees.  -He will benefit from continued simplified treatment regimen.  I advised for his caretaker to continue Levemir 25 units every morning with breakfast, associated with strict monitoring of blood glucose 2 times a day-daily before breakfast and before supper.  He will continue to benefit from metformin therapy, advised to continue metformin 1000 mg p.o. daily after breakfast and after supper.    2) Lipids/HPL: His repeat lipid panel showed controlled LDL at 75.  I advised to continue simvastatin 40 mg p.o. nightly for him.      - I advised patient to maintain close follow up with Hawkins, Edward, MD for primary care needs. - Patient Care Time Today:  25 min, of which >50% was spent in reviewing his  current and  previous labs/studies, previous treatments, and medications doses and developing a plan for long-term care based on the latest recommendations for standards of care.  Loukas Jakel's and caretaker participated in the discussions, expressed understanding, and voiced agreement with the above plans.  All questions were answered to   his satisfaction. he is encouraged to contact clinic should he have any questions or concerns prior to his return visit.   Follow up plan: -Return in 3 months with repeat labs, meter/logs. Gebre Noris Kulinski, MD Phone: [phone]  Fax: [phone]  -  This note was partially dictated with voice recognition software. Similar sounding words can be transcribed inadequately or may not  be corrected upon review.  11/10/2018, 11:42 AM 

## 2018-11-20 ENCOUNTER — Other Ambulatory Visit: Payer: Self-pay | Admitting: "Endocrinology

## 2018-12-21 ENCOUNTER — Other Ambulatory Visit: Payer: Self-pay | Admitting: "Endocrinology

## 2019-01-05 DIAGNOSIS — L851 Acquired keratosis [keratoderma] palmaris et plantaris: Secondary | ICD-10-CM | POA: Diagnosis not present

## 2019-01-05 DIAGNOSIS — B351 Tinea unguium: Secondary | ICD-10-CM | POA: Diagnosis not present

## 2019-01-05 DIAGNOSIS — E1342 Other specified diabetes mellitus with diabetic polyneuropathy: Secondary | ICD-10-CM | POA: Diagnosis not present

## 2019-02-09 DIAGNOSIS — Z794 Long term (current) use of insulin: Secondary | ICD-10-CM | POA: Diagnosis not present

## 2019-02-09 DIAGNOSIS — E1165 Type 2 diabetes mellitus with hyperglycemia: Secondary | ICD-10-CM | POA: Diagnosis not present

## 2019-02-10 LAB — COMPLETE METABOLIC PANEL WITH GFR
AG RATIO: 1.8 (calc) (ref 1.0–2.5)
ALKALINE PHOSPHATASE (APISO): 91 U/L (ref 35–144)
ALT: 12 U/L (ref 9–46)
AST: 13 U/L (ref 10–35)
Albumin: 3.9 g/dL (ref 3.6–5.1)
BILIRUBIN TOTAL: 0.3 mg/dL (ref 0.2–1.2)
BUN/Creatinine Ratio: 26 (calc) — ABNORMAL HIGH (ref 6–22)
BUN: 27 mg/dL — AB (ref 7–25)
CALCIUM: 9.1 mg/dL (ref 8.6–10.3)
CO2: 30 mmol/L (ref 20–32)
CREATININE: 1.03 mg/dL (ref 0.70–1.18)
Chloride: 106 mmol/L (ref 98–110)
GFR, Est African American: 81 mL/min/{1.73_m2} (ref >=60)
GFR, Est Non African American: 70 mL/min/{1.73_m2} (ref >=60)
GLUCOSE: 136 mg/dL — AB (ref 65–99)
Globulin: 2.2 g/dL (calc) (ref 1.9–3.7)
Potassium: 4.8 mmol/L (ref 3.5–5.3)
Sodium: 143 mmol/L (ref 135–146)
Total Protein: 6.1 g/dL (ref 6.1–8.1)

## 2019-02-10 LAB — HEMOGLOBIN A1C
EAG (MMOL/L): 9.2 (calc)
HEMOGLOBIN A1C: 7.4 %{Hb} — AB (ref <5.7)
Mean Plasma Glucose: 166 (calc)

## 2019-02-11 ENCOUNTER — Ambulatory Visit: Payer: Medicare Other | Admitting: "Endocrinology

## 2019-02-11 ENCOUNTER — Encounter: Payer: Self-pay | Admitting: "Endocrinology

## 2019-02-16 ENCOUNTER — Other Ambulatory Visit: Payer: Self-pay | Admitting: "Endocrinology

## 2019-03-08 DIAGNOSIS — H401132 Primary open-angle glaucoma, bilateral, moderate stage: Secondary | ICD-10-CM | POA: Diagnosis not present

## 2019-03-19 ENCOUNTER — Encounter: Payer: Self-pay | Admitting: "Endocrinology

## 2019-03-19 ENCOUNTER — Ambulatory Visit (INDEPENDENT_AMBULATORY_CARE_PROVIDER_SITE_OTHER): Payer: Medicare Other | Admitting: "Endocrinology

## 2019-03-19 ENCOUNTER — Other Ambulatory Visit: Payer: Self-pay

## 2019-03-19 DIAGNOSIS — E782 Mixed hyperlipidemia: Secondary | ICD-10-CM

## 2019-03-19 DIAGNOSIS — E1165 Type 2 diabetes mellitus with hyperglycemia: Secondary | ICD-10-CM | POA: Diagnosis not present

## 2019-03-19 DIAGNOSIS — IMO0001 Reserved for inherently not codable concepts without codable children: Secondary | ICD-10-CM

## 2019-03-19 DIAGNOSIS — Z794 Long term (current) use of insulin: Secondary | ICD-10-CM | POA: Diagnosis not present

## 2019-03-19 DIAGNOSIS — I10 Essential (primary) hypertension: Secondary | ICD-10-CM | POA: Diagnosis not present

## 2019-03-19 DIAGNOSIS — E1342 Other specified diabetes mellitus with diabetic polyneuropathy: Secondary | ICD-10-CM | POA: Diagnosis not present

## 2019-03-19 DIAGNOSIS — B351 Tinea unguium: Secondary | ICD-10-CM | POA: Diagnosis not present

## 2019-03-19 DIAGNOSIS — L851 Acquired keratosis [keratoderma] palmaris et plantaris: Secondary | ICD-10-CM | POA: Diagnosis not present

## 2019-03-19 NOTE — Progress Notes (Signed)
03/19/2019                                Endocrinology Telehealth Visit Follow up Note -During COVID -19 Pandemic  I connected with Andre Rowe on 03/19/2019   by telephone and verified that I am speaking with the correct person using two identifiers. Andre Rowe, 1941/02/02. he has verbally consented to this visit. All issues noted in this document were discussed and addressed. The format was not optimal for physical exam.   Subjective:    Patient ID: Andre Rowe, male    DOB: 10/17/1940, PCP Sinda Du, Andre Rowe   Past Medical History:  Diagnosis Date  . Alzheimer disease ([place])   . BPH (benign prostatic hyperplasia)   . COPD (chronic obstructive pulmonary disease) ([place])   . Dementia ([place])   . Diabetes mellitus without complication ([place])   . Diabetes mellitus, type II ([place])   . GERD (gastroesophageal reflux disease)   . High cholesterol   . Hyperlipidemia   . Hypertension   . Overactive bladder   . Schizophrenia [facility])    Past Surgical History:  Procedure Laterality Date  . unable     Social History   Socioeconomic History  . Marital status: Single    Spouse name: Not on file  . Number of children: Not on file  . Years of education: Not on file  . Highest education level: Not on file  Occupational History  . Not on file  Social Needs  . Financial resource strain: Not on file  . Food insecurity:    Worry: Not on file    Inability: Not on file  . Transportation needs:    Medical: Not on file    Non-medical: Not on file  Tobacco Use  . Smoking status: Current Every Day Smoker    Packs/day: 0.50    Types: Cigarettes  . Smokeless tobacco: Never Used  Substance and Sexual Activity  . Alcohol use: No  . Drug use: No  . Sexual activity: Not on file  Lifestyle  . Physical activity:    Days per week: Not on file    Minutes per session: Not on file  . Stress: Not on file  Relationships  . Social connections:    Talks on phone: Not on file    Gets  together: Not on file    Attends religious service: Not on file    Active member of club or organization: Not on file    Attends meetings of clubs or organizations: Not on file    Relationship status: Not on file  Other Topics Concern  . Not on file  Social History Narrative   ** Merged History Encounter **       Outpatient Encounter Medications as of 11/10/2018  Medication Sig  . acetaminophen (TYLENOL) 325 MG tablet Take 650 mg by mouth every 6 (six) hours as needed for mild pain or moderate pain.  . bimatoprost (LUMIGAN) 0.01 % SOLN Place 1 drop into both eyes at bedtime.  . carbamazepine (TEGRETOL) 200 MG tablet Take 200 mg by mouth 3 (three) times daily.  . clonazePAM (KLONOPIN) 0.5 MG tablet Take 0.5 mg by mouth 2 (two) times daily.   Marland Kitchen donepezil (ARICEPT) 10 MG tablet Take 10 mg by mouth at bedtime.  . folic acid (FOLVITE) 1 MG tablet Take 1 mg by mouth daily.  . insulin detemir (LEVEMIR) 100 UNIT/ML injection INJECT 25 UNITS  SUBCUTANEOUSLY EACH MORNING.  . Insulin Syringe-Needle U-100 (INSULIN SYRINGE .5CC/31GX5/16") 31G X 5/16" 0.5 ML MISC 1 each by Does not apply route every morning.  Marland Kitchen. lisinopril (PRINIVIL,ZESTRIL) 10 MG tablet Take 10 mg by mouth daily.  . memantine (NAMENDA) 10 MG tablet Take 10 mg by mouth 2 (two) times daily.  . metFORMIN (GLUCOPHAGE) 1000 MG tablet TAKE 1 TABLET BY MOUTH TWICE DAILY.  Marland Kitchen. OLANZapine (ZYPREXA) 10 MG tablet Take 10 mg by mouth at bedtime.  Marland Kitchen. omeprazole (PRILOSEC) 20 MG capsule Take 20 mg by mouth daily.  Marland Kitchen. oxybutynin (DITROPAN) 5 MG tablet Take 5 mg by mouth 2 (two) times daily.  . povidone-iodine (BETADINE) 10 % external solution Apply 1 application topically daily. Apply to skin between the 4th and 5th toes on both feet once daily  . senna (SENOKOT) 8.6 MG TABS tablet Take 1 tablet by mouth at bedtime.  . simvastatin (ZOCOR) 40 MG tablet Take 40 mg by mouth daily.  . tamsulosin (FLOMAX) 0.4 MG CAPS capsule Take 0.4 mg by mouth daily.  .  Vitamin D, Ergocalciferol, (DRISDOL) 1.25 MG (50000 UT) CAPS capsule TAKE 1 TABLET BY MOUTH ONCE WEEKLY.   No facility-administered encounter medications on file as of 11/10/2018.    ALLERGIES: No Known Allergies VACCINATION STATUS:  There is no immunization history on file for this patient.  Diabetes  He has well-controlled type 2 diabetes with A1c of 7.3%.  He was diagnosed at approximate age of 78 years.  I spoke with his caretaker and obtain his blood glucose readings which are reportedly all to be near target both fasting and postprandial.  He has no major hypoglycemia nor hyperglycemia.     Hyperlipidemia  This is a chronic problem. The current episode started more than 1 year ago. The problem is controlled. Exacerbating diseases include diabetes and obesity. Pertinent negatives include no chest pain, myalgias or shortness of breath. Current antihyperlipidemic treatment includes statins. Risk factors for coronary artery disease include diabetes mellitus, dyslipidemia, hypertension, male sex, obesity and a sedentary lifestyle.     Objective:      Recent Results (from the past 2160 hour(s))  Hemoglobin A1c     Status: Abnormal   Collection Time: 02/09/19  9:00 AM  Result Value Ref Range   Hgb A1c MFr Bld 7.4 (H) <5.7 % of total Hgb    Comment: For someone without known diabetes, a hemoglobin A1c value of 6.5% or greater indicates that they may have  diabetes and this should be confirmed with a follow-up  test. . For someone with known diabetes, a value <7% indicates  that their diabetes is well controlled and a value  greater than or equal to 7% indicates suboptimal  control. A1c targets should be individualized based on  duration of diabetes, age, comorbid conditions, and  other considerations. . Currently, no consensus exists regarding use of hemoglobin A1c for diagnosis of diabetes for children. .    Mean Plasma Glucose 166 (calc)   eAG (mmol/L) 9.2 (calc)   COMPLETE METABOLIC PANEL WITH GFR     Status: Abnormal   Collection Time: 02/09/19  9:00 AM  Result Value Ref Range   Glucose, Bld 136 (H) 65 - 99 mg/dL    Comment: .            Fasting reference interval . For someone without known diabetes, a glucose value >125 mg/dL indicates that they may have diabetes and this should be confirmed with a follow-up test. .  BUN 27 (H) 7 - 25 mg/dL   Creat 1.611.03 0.960.70 - 0.451.18 mg/dL    Comment: For patients >76 years of age, the reference limit for Creatinine is approximately 13% higher for people identified as African-American. .    GFR, Est Non African American 70 > OR = 60 mL/min/1.4573m2   GFR, Est African American 81 > OR = 60 mL/min/1.6173m2   BUN/Creatinine Ratio 26 (H) 6 - 22 (calc)   Sodium 143 135 - 146 mmol/L   Potassium 4.8 3.5 - 5.3 mmol/L   Chloride 106 98 - 110 mmol/L   CO2 30 20 - 32 mmol/L   Calcium 9.1 8.6 - 10.3 mg/dL   Total Protein 6.1 6.1 - 8.1 g/dL   Albumin 3.9 3.6 - 5.1 g/dL   Globulin 2.2 1.9 - 3.7 g/dL (calc)   AG Ratio 1.8 1.0 - 2.5 (calc)   Total Bilirubin 0.3 0.2 - 1.2 mg/dL   Alkaline phosphatase (APISO) 91 35 - 144 U/L   AST 13 10 - 35 U/L   ALT 12 9 - 46 U/L   Lipid Panel     Component Value Date/Time   CHOL 161 06/01/2018 0940   TRIG 77 06/01/2018 0940   HDL 47 06/01/2018 0940   CHOLHDL 3.4 06/01/2018 0940   VLDL 18 10/11/2016 0858   LDLCALC 97 06/01/2018 0940     Assessment & Plan:   1. Uncontrolled type 2 diabetes mellitus without complication, with long-term current use of insulin (HCC)  - He remains at a high risk for more acute and chronic complications of diabetes which include CAD, CVA, CKD, retinopathy, and neuropathy. These are all discussed in detail with the patient.  His previsit labs show A1c of 7.4%, acceptable profile.  - I have re-counseled the patient on diet management and weight loss  by adopting a carbohydrate restricted / protein rich  Diet.   - Patient is advised to stick  to a routine mealtimes to eat 3 meals  a day and avoid unnecessary snacks ( to snack only to correct hypoglycemia).  - I have approached patient with the following individualized plan to manage diabetes and patient agrees.  -He will benefit from continued simplified treatment regimen.  I advised for his caretaker to continue Levemir 25 units every morning with breakfast, associated with strict monitoring of blood glucose 2 times a day-daily before breakfast and before supper.  He will continue to benefit from metformin therapy, advised to continue metformin 1000 mg p.o. daily after breakfast and after supper.    2) Lipids/HPL: His repeat lipid panel showed LDL of 97.  He is advised to continue simvastatin 40 mg p.o. nightly.   - I advised patient to maintain close follow up with Andre Rowe, Edward, Andre Rowe for primary care needs.  - Patient Care Time Today:  25 min, of which >50% was spent in  counseling and the rest reviewing his  current and  previous labs/studies, previous treatments, his blood glucose readings, and medications' doses and developing a plan for long-term care based on the latest recommendations for standards of care.   Andre Sergeharlie Rowe participated in the discussions, expressed understanding, and voiced agreement with the above plans.  All questions were answered to his satisfaction. he is encouraged to contact clinic should he have any questions or concerns prior to his return visit.   Follow up plan: -Return in 3 months with repeat labs, meter/logs. Marquis LunchGebre [name], Andre Rowe Phone: [phone]  Fax: [phone]  -  This note was  partially dictated with voice recognition software. Similar sounding words can be transcribed inadequately or may not  be corrected upon review.  11/10/2018, 11:42 AM

## 2019-03-22 ENCOUNTER — Emergency Department (HOSPITAL_COMMUNITY)
Admission: EM | Admit: 2019-03-22 | Discharge: 2019-03-22 | Disposition: A | Discharge status: Home / Self Care | Payer: Medicare Other | Attending: Emergency Medicine | Admitting: Emergency Medicine

## 2019-03-22 ENCOUNTER — Emergency Department (HOSPITAL_COMMUNITY): Payer: Medicare Other

## 2019-03-22 ENCOUNTER — Encounter (HOSPITAL_COMMUNITY): Payer: Self-pay

## 2019-03-22 DIAGNOSIS — S80212A Abrasion, left knee, initial encounter: Secondary | ICD-10-CM | POA: Insufficient documentation

## 2019-03-22 DIAGNOSIS — F0390 Unspecified dementia without behavioral disturbance: Secondary | ICD-10-CM | POA: Diagnosis not present

## 2019-03-22 DIAGNOSIS — Z79899 Other long term (current) drug therapy: Secondary | ICD-10-CM | POA: Diagnosis not present

## 2019-03-22 DIAGNOSIS — W19XXXA Unspecified fall, initial encounter: Secondary | ICD-10-CM | POA: Diagnosis not present

## 2019-03-22 DIAGNOSIS — Y999 Unspecified external cause status: Secondary | ICD-10-CM | POA: Diagnosis not present

## 2019-03-22 DIAGNOSIS — I10 Essential (primary) hypertension: Secondary | ICD-10-CM | POA: Insufficient documentation

## 2019-03-22 DIAGNOSIS — R0902 Hypoxemia: Secondary | ICD-10-CM | POA: Diagnosis not present

## 2019-03-22 DIAGNOSIS — R079 Chest pain, unspecified: Secondary | ICD-10-CM | POA: Diagnosis not present

## 2019-03-22 DIAGNOSIS — Y939 Activity, unspecified: Secondary | ICD-10-CM | POA: Diagnosis not present

## 2019-03-22 DIAGNOSIS — S3993XA Unspecified injury of pelvis, initial encounter: Secondary | ICD-10-CM | POA: Diagnosis not present

## 2019-03-22 DIAGNOSIS — E119 Type 2 diabetes mellitus without complications: Secondary | ICD-10-CM | POA: Diagnosis not present

## 2019-03-22 DIAGNOSIS — M79605 Pain in left leg: Secondary | ICD-10-CM | POA: Diagnosis not present

## 2019-03-22 DIAGNOSIS — Z7984 Long term (current) use of oral hypoglycemic drugs: Secondary | ICD-10-CM | POA: Insufficient documentation

## 2019-03-22 DIAGNOSIS — M25462 Effusion, left knee: Secondary | ICD-10-CM | POA: Diagnosis not present

## 2019-03-22 DIAGNOSIS — Z209 Contact with and (suspected) exposure to unspecified communicable disease: Secondary | ICD-10-CM | POA: Diagnosis not present

## 2019-03-22 DIAGNOSIS — S8992XA Unspecified injury of left lower leg, initial encounter: Secondary | ICD-10-CM | POA: Diagnosis not present

## 2019-03-22 DIAGNOSIS — Y92129 Unspecified place in nursing home as the place of occurrence of the external cause: Secondary | ICD-10-CM | POA: Diagnosis not present

## 2019-03-22 DIAGNOSIS — R410 Disorientation, unspecified: Secondary | ICD-10-CM | POA: Diagnosis not present

## 2019-03-22 DIAGNOSIS — R52 Pain, unspecified: Secondary | ICD-10-CM | POA: Diagnosis not present

## 2019-03-22 NOTE — Discharge Instructions (Addendum)
Follow-up with your doctor if any problems 

## 2019-03-22 NOTE — ED Notes (Signed)
[place] to pick up patient

## 2019-03-22 NOTE — ED Provider Notes (Signed)
[facility] EMERGENCY DEPARTMENT Provider Note   CSN: [id number] Arrival date & time: 03/22/19  1411     History   Chief Complaint Chief Complaint  Patient presents with   Fall    HPI Andre Rowe is a 78 y.o. male.     Patient fell on the floor and was unable to get up.  Patient does complain of some left knee pain.  No loss of consciousness known but patient is demented  The history is provided by the patient and a relative. No language interpreter was used.  Fall This is a new problem. The current episode started 1 to 2 hours ago. The problem occurs rarely. The problem has been resolved. Associated symptoms include chest pain. Nothing aggravates the symptoms. Nothing relieves the symptoms. He has tried nothing for the symptoms. The treatment provided no relief.    Past Medical History:  Diagnosis Date   Dementia (HCC)    Diabetes mellitus without complication (HCC)    Hypertension     There are no active problems to display for this patient.   History reviewed. No pertinent surgical history.      Home Medications    Prior to Admission medications   Medication Sig Start Date End Date Taking? Authorizing Provider  bimatoprost (LUMIGAN) 0.01 % SOLN Place 1 drop into both eyes at bedtime.   Yes [provider]  carbamazepine (TEGRETOL) 200 MG tablet Take 200 mg by mouth 3 (three) times daily.   Yes [provider]  clonazePAM (KLONOPIN) 0.5 MG tablet Take 0.5 mg by mouth 2 (two) times daily.   Yes [provider]  donepezil (ARICEPT) 10 MG tablet Take 10 mg by mouth at bedtime.   Yes [provider]  folic acid (FOLVITE) 1 MG tablet Take 1 mg by mouth daily.   Yes [provider]  insulin detemir (LEVEMIR) 100 UNIT/ML injection Inject 25 Units into the skin every morning.   Yes [provider]  lisinopril (ZESTRIL) 20 MG tablet Take 20 mg by mouth daily.   Yes [provider]  memantine  (NAMENDA) 10 MG tablet Take 10 mg by mouth 2 (two) times daily.   Yes [provider]  metFORMIN (GLUCOPHAGE) 1000 MG tablet TAKE 1 TABLET BY MOUTH TWICE DAILY. Patient taking differently: Take 1,000 mg by mouth 2 (two) times daily with a meal.  12/22/18  Yes [name], Denman GeorgeGebreselassie W, MD  OLANZapine (ZYPREXA) 10 MG tablet Take 10 mg by mouth at bedtime.   Yes [provider]  omeprazole (PRILOSEC) 20 MG capsule Take 20 mg by mouth every morning.   Yes [provider]  oxybutynin (DITROPAN) 5 MG tablet Take 5 mg by mouth 2 (two) times daily.   Yes [provider]  senna-docusate (SENOKOT-S) 8.6-50 MG tablet Take 1 tablet by mouth at bedtime.   Yes [provider]  simvastatin (ZOCOR) 40 MG tablet Take 40 mg by mouth at bedtime.   Yes [provider]  tamsulosin (FLOMAX) 0.4 MG CAPS capsule Take 0.4 mg by mouth every evening.   Yes [provider]  Vitamin D, Ergocalciferol, (DRISDOL) 1.25 MG (50000 UT) CAPS capsule TAKE 1 TABLET BY MOUTH ONCE WEEKLY. Patient taking differently: Take 50,000 Units by mouth every Monday.  02/17/19  Yes Roma KayserNida, Gebreselassie W, MD    Family History No family history on file.  Social History Social History   Tobacco Use   Smoking status: Not on file  Substance Use Topics  Alcohol use: Not on file   Drug use: Not on file     Allergies   Patient has no known allergies.   Review of Systems Review of Systems  Unable to perform ROS: Dementia  Cardiovascular: Positive for chest pain.     Physical Exam Updated Vital Signs BP 129/80    Pulse 79    Temp 98.2 F (36.8 C) (Oral)    Resp 16    SpO2 99%   Physical Exam Vitals signs and nursing note reviewed.  Constitutional:      Appearance: He is well-developed.  HENT:     Head: Normocephalic.  Eyes:     General: No scleral icterus.    Conjunctiva/sclera: Conjunctivae normal.  Neck:     Musculoskeletal: Neck supple.     Thyroid: No  thyromegaly.  Cardiovascular:     Rate and Rhythm: Normal rate and regular rhythm.     Heart sounds: No murmur. No friction rub. No gallop.   Pulmonary:     Breath sounds: No stridor. No wheezing or rales.  Chest:     Chest wall: No tenderness.  Abdominal:     General: There is no distension.     Tenderness: There is no abdominal tenderness. There is no rebound.  Musculoskeletal: Normal range of motion.     Comments: Tender left knee with abrasion  Lymphadenopathy:     Cervical: No cervical adenopathy.  Skin:    Findings: No erythema or rash.  Neurological:     Motor: No abnormal muscle tone.     Coordination: Coordination normal.     Comments: Oriented to person only      ED Treatments / Results  Labs (all labs ordered are listed, but only abnormal results are displayed) Labs Reviewed - No data to display  EKG None  Radiology Ct Head Wo Contrast  Result Date: 03/22/2019 CLINICAL DATA:  Patient has dementia.  Status post fall. EXAM: CT HEAD WITHOUT CONTRAST CT CERVICAL SPINE WITHOUT CONTRAST TECHNIQUE: Multidetector CT imaging of the head and cervical spine was performed following the standard protocol without intravenous contrast. Multiplanar CT image reconstructions of the cervical spine were also generated. COMPARISON:  Patient's prior head CT from 2011 is not available on PACs for comparison. FINDINGS: CT HEAD FINDINGS Brain: No evidence of acute infarction, hemorrhage, hydrocephalus, extra-axial collection or mass lesion/mass effect. There is chronic diffuse atrophy. Chronic bilateral periventricular white matter small vessel ischemic changes are noted. Vascular: No hyperdense vessel is noted. Skull: No acute abnormality identified. Sinuses/Orbits: No acute finding. Other: None. CT CERVICAL SPINE FINDINGS Alignment: There is kyphosis of spine. Skull base and vertebrae: No acute fracture. No primary bone lesion or focal pathologic process. Soft tissues and spinal canal: No  prevertebral fluid or swelling. No visible canal hematoma. Disc levels: There are degenerative joint changes noted throughout the cervical spine with narrowed joint space and osteophyte formation. Upper chest: No acute findings. Emphysematous changes are identified. Other: None. IMPRESSION: No focal acute intracranial abnormality identified. Chronic diffuse atrophy. Chronic bilateral periventricular white small vessel change. No acute fracture or dislocation is identified in the cervical spine. Electronically Signed   By: Abelardo Diesel M.D.   On: 03/22/2019 18:34   Ct Cervical Spine Wo Contrast  Result Date: 03/22/2019 CLINICAL DATA:  Patient has dementia.  Status post fall. EXAM: CT HEAD WITHOUT CONTRAST CT CERVICAL SPINE WITHOUT CONTRAST TECHNIQUE: Multidetector CT imaging of the head and cervical spine was performed following the standard protocol without intravenous  contrast. Multiplanar CT image reconstructions of the cervical spine were also generated. COMPARISON:  Patient's prior head CT from 2011 is not available on PACs for comparison. FINDINGS: CT HEAD FINDINGS Brain: No evidence of acute infarction, hemorrhage, hydrocephalus, extra-axial collection or mass lesion/mass effect. There is chronic diffuse atrophy. Chronic bilateral periventricular white matter small vessel ischemic changes are noted. Vascular: No hyperdense vessel is noted. Skull: No acute abnormality identified. Sinuses/Orbits: No acute finding. Other: None. CT CERVICAL SPINE FINDINGS Alignment: There is kyphosis of spine. Skull base and vertebrae: No acute fracture. No primary bone lesion or focal pathologic process. Soft tissues and spinal canal: No prevertebral fluid or swelling. No visible canal hematoma. Disc levels: There are degenerative joint changes noted throughout the cervical spine with narrowed joint space and osteophyte formation. Upper chest: No acute findings. Emphysematous changes are identified. Other: None. IMPRESSION:  No focal acute intracranial abnormality identified. Chronic diffuse atrophy. Chronic bilateral periventricular white small vessel change. No acute fracture or dislocation is identified in the cervical spine. Electronically Signed   By: Sherian ReinWei-Chen  Lin M.D.   On: 03/22/2019 18:34   Dg Pelvis Portable  Result Date: 03/22/2019 CLINICAL DATA:  Fall, found down EXAM: PORTABLE PELVIS 1-2 VIEWS COMPARISON:  None. FINDINGS: No evidence of pelvic fracture or diastasis. Please note the femora appear externally rotated which superimposes the greater trochanters over the femoral necks and may limit detection of small, nondisplaced fractures. Consider dedicated hip imaging if felt to be clinically warranted. 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 Minimal degenerative changes in the SI joints. The soft tissues are unremarkable. IMPRESSION: No visible fracture or traumatic malalignment The femora appear externally rotated which superimposes the femoral necks over the femoral necks and may limit detection of small, nondisplaced fractures. Consider dedicated hip imaging if felt to be clinically warranted. Electronically Signed   By: Kreg ShropshirePrice  DeHay M.D.   On: 03/22/2019 16:29   Dg Knee Left Port  Result Date: 03/22/2019 CLINICAL DATA:  Fall, found down, non verbal responses to questioning EXAM: PORTABLE LEFT KNEE - 1-2 VIEW COMPARISON:  None. FINDINGS: No evidence of acute fracture or traumatic malalignment. Small suprapatellar joint effusion. Osseous excrescence along the medial femoral condyle with adjacent rounded ossification likely reflect a Pellegrini-Stieda lesion, typically the result of a remote medial collateral ligament injury. IMPRESSION: Small suprapatellar joint effusion. No acute osseous abnormality. Pellegrini-Stieda lesion likely reflecting remote MCL injury. Electronically Signed   By: Kreg ShropshirePrice  DeHay M.D.   On: 03/22/2019 16:31    Procedures Procedures (including  critical care time)  Medications Ordered in ED Medications - No data to display   Initial Impression / Assessment and Plan / ED Course  I have reviewed the triage vital signs and the nursing notes.  Pertinent labs & imaging results that were available during my care of the patient were reviewed by me and considered in my medical decision making (see chart for details).        Fall with dementia and abrasion to left knee.  X-rays unremarkable.  He will follow-up with his doctor  Final Clinical Impressions(s) / ED Diagnoses   Final diagnoses:  Fall, initial encounter    ED Discharge Orders    None       Bethann BerkshireZammit, Shaniya Tashiro, MD 03/22/19 [phone]

## 2019-03-22 NOTE — ED Triage Notes (Signed)
Pt came from [facility]. Pt found on the floor. Pt has dementia He states his heart hurts

## 2019-03-23 ENCOUNTER — Other Ambulatory Visit: Payer: Self-pay | Admitting: "Endocrinology

## 2019-03-24 ENCOUNTER — Encounter: Payer: Self-pay | Admitting: "Endocrinology

## 2019-04-08 DIAGNOSIS — F250 Schizoaffective disorder, bipolar type: Secondary | ICD-10-CM | POA: Diagnosis not present

## 2019-05-24 ENCOUNTER — Other Ambulatory Visit: Payer: Self-pay | Admitting: "Endocrinology

## 2019-05-28 DIAGNOSIS — E1342 Other specified diabetes mellitus with diabetic polyneuropathy: Secondary | ICD-10-CM | POA: Diagnosis not present

## 2019-05-28 DIAGNOSIS — L851 Acquired keratosis [keratoderma] palmaris et plantaris: Secondary | ICD-10-CM | POA: Diagnosis not present

## 2019-05-28 DIAGNOSIS — B351 Tinea unguium: Secondary | ICD-10-CM | POA: Diagnosis not present

## 2019-07-12 DIAGNOSIS — K5900 Constipation, unspecified: Secondary | ICD-10-CM | POA: Diagnosis not present

## 2019-07-12 DIAGNOSIS — E119 Type 2 diabetes mellitus without complications: Secondary | ICD-10-CM | POA: Diagnosis not present

## 2019-07-12 DIAGNOSIS — I10 Essential (primary) hypertension: Secondary | ICD-10-CM | POA: Diagnosis not present

## 2019-07-12 DIAGNOSIS — F209 Schizophrenia, unspecified: Secondary | ICD-10-CM | POA: Diagnosis not present

## 2019-07-15 DIAGNOSIS — F0391 Unspecified dementia with behavioral disturbance: Secondary | ICD-10-CM | POA: Insufficient documentation

## 2019-07-15 DIAGNOSIS — F03918 Unspecified dementia, unspecified severity, with other behavioral disturbance: Secondary | ICD-10-CM | POA: Insufficient documentation

## 2019-09-21 ENCOUNTER — Other Ambulatory Visit: Payer: Self-pay

## 2019-09-21 ENCOUNTER — Encounter: Payer: Self-pay | Admitting: Family Medicine

## 2019-09-21 ENCOUNTER — Ambulatory Visit (INDEPENDENT_AMBULATORY_CARE_PROVIDER_SITE_OTHER): Payer: Medicare Other | Admitting: Family Medicine

## 2019-09-21 VITALS — BP 105/77 | HR 99 | Temp 98.0°F | Wt 206.8 lb

## 2019-09-21 DIAGNOSIS — Z794 Long term (current) use of insulin: Secondary | ICD-10-CM

## 2019-09-21 DIAGNOSIS — F0390 Unspecified dementia without behavioral disturbance: Secondary | ICD-10-CM

## 2019-09-21 DIAGNOSIS — E559 Vitamin D deficiency, unspecified: Secondary | ICD-10-CM

## 2019-09-21 DIAGNOSIS — I10 Essential (primary) hypertension: Secondary | ICD-10-CM

## 2019-09-21 DIAGNOSIS — E1169 Type 2 diabetes mellitus with other specified complication: Secondary | ICD-10-CM

## 2019-09-21 DIAGNOSIS — F209 Schizophrenia, unspecified: Secondary | ICD-10-CM | POA: Diagnosis not present

## 2019-09-21 DIAGNOSIS — E118 Type 2 diabetes mellitus with unspecified complications: Secondary | ICD-10-CM

## 2019-09-21 DIAGNOSIS — E785 Hyperlipidemia, unspecified: Secondary | ICD-10-CM | POA: Diagnosis not present

## 2019-09-21 NOTE — Patient Instructions (Addendum)
COVID-19 Vaccine Information can be found at: [email or web address] For questions related to vaccine distribution or appointments, please email [email or web address] or call [phone].   Keep psy appointment Keep neurology appointment  Stop the lisinopril -continued checking blood pressure every morning Fasting blood work

## 2019-09-21 NOTE — Progress Notes (Addendum)
New Patient Office Visit  Subjective:  Patient ID: Andre Rowe, male    DOB: 01-Feb-1941  Age: 79 y.o. MRN: [id number]  CC:  Chief Complaint  Patient presents with  . Establish Care  HTN COPD Alzheimer Disease BPH/incontinence   HPI Andre Rowe presents for DM--takes Levemir -25units/day +metformin-needs A1c for evaluation-scheduled to see endo Friday Alzheimer Disease-Donepezil daily at bedtime, Menmantine 10mg  BID Constipation-Senna-S Sees psy-takes zyprexa, Clonazepam-diagnosis schizophrenia GERD-omeprazole Incontinence-oxybutynin-wears adult diapers-concern for skin breakdown inguinal area, no ulceration Hyperlipidemia-simvastatin daily BPH-tamsulosin daily HTN-lisinopril 20mg -nurse/caregiver confirmed-on MAR at 10mg    Past Medical History:  Diagnosis Date  . Alzheimer disease ([place])   . BPH (benign prostatic hyperplasia)   . COPD (chronic obstructive pulmonary disease) ([place])   . Dementia ([place])   . Diabetes mellitus without complication ([place])   . Diabetes mellitus, type II ([place])   . GERD (gastroesophageal reflux disease)   . High cholesterol   . Hyperlipidemia   . Hypertension   . Overactive bladder   . Schizophrenia ([place])   . Unspecified dementia with behavioral disturbance [facility])     Past Surgical History:  Procedure Laterality Date  . unable      Family History  Family history unknown: Yes    Social History   Socioeconomic History  . Marital status: Single    Spouse name: Not on file  . Number of children: Not on file  . Years of education: Not on file  . Highest education level: Not on file  Occupational History  . Not on file  Tobacco Use  . Smoking status: Current Every Day Smoker    Packs/day: 0.50    Types: Cigarettes  . Smokeless tobacco: Never Used  Substance and Sexual Activity  . Alcohol use: No  . Drug use: No  . Sexual activity: Not on file  Other Topics Concern  . Not on file  Social History Narrative   ** Merged  History Encounter **       ** Merged History Encounter **       Social Determinants of Health   Financial Resource Strain:   . Difficulty of Paying Living Expenses: Not on file  Food Insecurity:   . Worried About Charity fundraiser in the Last Year: Not on file  . Ran Out of Food in the Last Year: Not on file  Transportation Needs:   . Lack of Transportation (Medical): Not on file  . Lack of Transportation (Non-Medical): Not on file  Physical Activity:   . Days of Exercise per Week: Not on file  . Minutes of Exercise per Session: Not on file  Stress:   . Feeling of Stress : Not on file  Social Connections:   . Frequency of Communication with Friends and Family: Not on file  . Frequency of Social Gatherings with Friends and Family: Not on file  . Attends Religious Services: Not on file  . Active Member of Clubs or Organizations: Not on file  . Attends Archivist Meetings: Not on file  . Marital Status: Not on file  Intimate Partner Violence:   . Fear of Current or Ex-Partner: Not on file  . Emotionally Abused: Not on file  . Physically Abused: Not on file  . Sexually Abused: Not on file    ROS Review of Systems  HENT:       No hearing aids No difficulty swallowing, no teeth-staff helps with cutting food if needed  Eyes:  Sees eye doctor-limited vision-cataracts  Endocrine:       DM  Genitourinary:       Incontinence  Musculoskeletal: Negative.   Skin:       Skin redness, no ulcers  Hematological: Negative.   Psychiatric/Behavioral: Positive for agitation and confusion.    Objective:   Today's Vitals: BP 105/77 (BP Location: Right Arm, Patient Position: Sitting, Cuff Size: Normal)   Pulse 99   Temp 98 F (36.7 C) (Oral)   Wt 206 lb 12.8 oz (93.8 kg)   SpO2 93%   BMI 31.44 kg/m   Physical Exam Constitutional:      Appearance: Normal appearance.  HENT:     Head: Normocephalic and atraumatic.  Cardiovascular:     Rate and Rhythm: Normal  rate and regular rhythm.     Pulses: Normal pulses.     Heart sounds: Normal heart sounds.  Pulmonary:     Effort: Pulmonary effort is normal.     Breath sounds: Normal breath sounds.  Musculoskeletal:     Cervical back: Normal range of motion and neck supple.  Skin:    Comments: Inguinal skin  Breakdown-no ulceration-eryth  Neurological:     Mental Status: He is alert and oriented to person, place, and time.  Psychiatric:        Mood and Affect: Mood normal.        Behavior: Behavior normal.     Assessment & Plan:   1. Dementia without behavioral disturbance, unspecified dementia type (HCC) Namenda/aricept-not aggressive, difficulty to understand .  Follows commands of care giver - Ambulatory referral to Neurology - TSH + free T4  2. Schizophrenia, unspecified type (HCC) Seen by psy - Ambulatory referral to Neurology - TSH + free T4  3. Vitamin D deficiency level - VITAMIN D 25 Hydroxy (Vit-D Deficiency, Fractures)  4. Essential hypertension pts old records states 10mg  but nurse at facility says 20mg s-decrease to 10mg  due to low bp - COMPLETE METABOLIC PANEL WITH GFR - CBC w/Diff/Platelet - Urine Microscopic  5. Controlled type 2 diabetes mellitus with complication, with long-term current use of insulin (HCpleas - Hemoglobin A1c Levemir and metformin-glucose readings stable 6. Hyperlipidemia associated with type 2 diabetes mellitus (HCC) simvastatin - Lipid panel  Outpatient Encounter Medications as of 09/21/2019  Medication Sig  . acetaminophen (TYLENOL) 325 MG tablet Take 650 mg by mouth every 6 (six) hours as needed for mild pain or moderate pain.  . bimatoprost (LUMIGAN) 0.01 % SOLN Place 1 drop into both eyes at bedtime.  . carbamazepine (TEGRETOL) 200 MG tablet Take 200 mg by mouth 3 (three) times daily.  . carbamazepine (TEGRETOL) 200 MG tablet Take 200 mg by mouth 3 (three) times daily.  . clonazePAM (KLONOPIN) 0.5 MG tablet Take 0.5 mg by mouth 2 (two)  times daily.   donepezil (ARICEPT) 10 MG tablet Take 10 mg by mouth at bedtime.  . folic acid (FOLVITE) 1 MG tablet Take 1 mg by mouth daily.  . insulin detemir (LEVEMIR) 100 UNIT/ML injection INJECT 25 UNITS SUBCUTANEOUSLY EACH MORNING.  . insulin detemir (LEVEMIR) 100 UNIT/ML injection Inject 25 Units into the skin every morning.  . Insulin Syringe-Needle U-100 (INSULIN SYRINGE .5CC/31GX5/16") 31G X 5/16" 0.5 ML MISC 1 each by Does not apply route every morning.  11/19/2019 lisinopril (PRINIVIL,ZESTRIL) 10 MG tablet Take 10 mg by mouth daily.  Marland Kitchen lisinopril (ZESTRIL) 20 MG tablet Take 20 mg by mouth daily.  . memantine (NAMENDA) 10 MG tablet Take 10 mg  by mouth 2 (two) times daily.  . metFORMIN (GLUCOPHAGE) 1000 MG tablet TAKE 1 TABLET BY MOUTH TWICE DAILY.  . metFORMIN (GLUCOPHAGE) 1000 MG tablet TAKE 1 TABLET BY MOUTH TWICE DAILY. (Patient taking differently: Take 1,000 mg by mouth 2 (two) times daily with a meal. )  . OLANZapine (ZYPREXA) 10 MG tablet Take 10 mg by mouth at bedtime.  Marland Kitchen omeprazole (PRILOSEC) 20 MG capsule Take 20 mg by mouth daily.  Marland Kitchen oxybutynin (DITROPAN) 5 MG tablet Take 5 mg by mouth 2 (two) times daily.  . povidone-iodine (BETADINE) 10 % external solution Apply 1 application topically daily. Apply to skin between the 4th and 5th toes on both feet once daily  . senna (SENOKOT) 8.6 MG TABS tablet Take 1 tablet by mouth at bedtime.  . senna-docusate (SENOKOT-S) 8.6-50 MG tablet Take 1 tablet by mouth at bedtime.  . simvastatin (ZOCOR) 40 MG tablet Take 40 mg by mouth daily.  . tamsulosin (FLOMAX) 0.4 MG CAPS capsule Take 0.4 mg by mouth daily.  . Vitamin D, Ergocalciferol, (DRISDOL) 1.25 MG (50000 UT) CAPS capsule TAKE 1 TABLET BY MOUTH ONCE WEEKLY.   No facility-administered encounter medications on file as of 09/21/2019.    Follow-up: neurology refer, f/u 3 months  LISA Mat Carne, MD

## 2019-09-22 ENCOUNTER — Telehealth: Payer: Self-pay | Admitting: Family Medicine

## 2019-09-22 ENCOUNTER — Telehealth: Payer: Self-pay

## 2019-09-22 DIAGNOSIS — F209 Schizophrenia, unspecified: Secondary | ICD-10-CM | POA: Diagnosis not present

## 2019-09-22 DIAGNOSIS — E1169 Type 2 diabetes mellitus with other specified complication: Secondary | ICD-10-CM | POA: Diagnosis not present

## 2019-09-22 DIAGNOSIS — E785 Hyperlipidemia, unspecified: Secondary | ICD-10-CM | POA: Diagnosis not present

## 2019-09-22 DIAGNOSIS — E559 Vitamin D deficiency, unspecified: Secondary | ICD-10-CM | POA: Diagnosis not present

## 2019-09-22 DIAGNOSIS — I10 Essential (primary) hypertension: Secondary | ICD-10-CM

## 2019-09-22 DIAGNOSIS — F0390 Unspecified dementia without behavioral disturbance: Secondary | ICD-10-CM | POA: Diagnosis not present

## 2019-09-22 DIAGNOSIS — Z794 Long term (current) use of insulin: Secondary | ICD-10-CM | POA: Diagnosis not present

## 2019-09-22 DIAGNOSIS — E118 Type 2 diabetes mellitus with unspecified complications: Secondary | ICD-10-CM | POA: Diagnosis not present

## 2019-09-22 MED ORDER — LISINOPRIL 10 MG PO TABS: 10.0000 mg | ORAL_TABLET | Freq: Every day | ORAL | 1 refills | Status: DC

## 2019-09-22 NOTE — Telephone Encounter (Signed)
LeighAnn Jarl Sellitto, CMA  

## 2019-09-22 NOTE — Telephone Encounter (Signed)
Andre Rowe is calling in regards to this patient being seen yesterday and would like to speak with the nurse about his medication.  [phone]

## 2019-09-22 NOTE — Telephone Encounter (Signed)
Lisinopril 10mg -decrease from 20mg .

## 2019-09-22 NOTE — Telephone Encounter (Signed)
Andre Rowe is aware of that

## 2019-09-22 NOTE — Telephone Encounter (Signed)
Andre Rowe is stating that Andre Rowe is Lisinopril 20mg  not 10mg  she is concerned that him stopping the Lisinopril all together will be a problem, Please advise?

## 2019-09-23 DIAGNOSIS — F250 Schizoaffective disorder, bipolar type: Secondary | ICD-10-CM | POA: Diagnosis not present

## 2019-09-24 ENCOUNTER — Encounter: Payer: Self-pay | Admitting: "Endocrinology

## 2019-09-24 ENCOUNTER — Other Ambulatory Visit: Payer: Self-pay

## 2019-09-24 ENCOUNTER — Ambulatory Visit: Payer: Medicare Other | Admitting: "Endocrinology

## 2019-09-24 ENCOUNTER — Ambulatory Visit (INDEPENDENT_AMBULATORY_CARE_PROVIDER_SITE_OTHER): Payer: Medicare Other | Admitting: "Endocrinology

## 2019-09-24 VITALS — BP 96/60 | HR 94

## 2019-09-24 DIAGNOSIS — F0390 Unspecified dementia without behavioral disturbance: Secondary | ICD-10-CM | POA: Diagnosis not present

## 2019-09-24 DIAGNOSIS — E1165 Type 2 diabetes mellitus with hyperglycemia: Secondary | ICD-10-CM | POA: Diagnosis not present

## 2019-09-24 DIAGNOSIS — E1169 Type 2 diabetes mellitus with other specified complication: Secondary | ICD-10-CM | POA: Diagnosis not present

## 2019-09-24 DIAGNOSIS — E118 Type 2 diabetes mellitus with unspecified complications: Secondary | ICD-10-CM | POA: Diagnosis not present

## 2019-09-24 DIAGNOSIS — F209 Schizophrenia, unspecified: Secondary | ICD-10-CM | POA: Diagnosis not present

## 2019-09-24 DIAGNOSIS — B351 Tinea unguium: Secondary | ICD-10-CM | POA: Diagnosis not present

## 2019-09-24 DIAGNOSIS — L851 Acquired keratosis [keratoderma] palmaris et plantaris: Secondary | ICD-10-CM | POA: Diagnosis not present

## 2019-09-24 DIAGNOSIS — I10 Essential (primary) hypertension: Secondary | ICD-10-CM | POA: Diagnosis not present

## 2019-09-24 DIAGNOSIS — E785 Hyperlipidemia, unspecified: Secondary | ICD-10-CM | POA: Diagnosis not present

## 2019-09-24 DIAGNOSIS — Z794 Long term (current) use of insulin: Secondary | ICD-10-CM | POA: Diagnosis not present

## 2019-09-24 DIAGNOSIS — E782 Mixed hyperlipidemia: Secondary | ICD-10-CM | POA: Diagnosis not present

## 2019-09-24 DIAGNOSIS — E1342 Other specified diabetes mellitus with diabetic polyneuropathy: Secondary | ICD-10-CM | POA: Diagnosis not present

## 2019-09-24 DIAGNOSIS — E559 Vitamin D deficiency, unspecified: Secondary | ICD-10-CM | POA: Diagnosis not present

## 2019-09-24 NOTE — Progress Notes (Signed)
09/24/2019               Endocrinology follow-up note    Subjective:    Patient ID: Andre Rowe, male    DOB: Jan 28, 1941, PCP Kari Baars, MD Past Medical History:  Diagnosis Date  . Alzheimer disease (HCC)   . BPH (benign prostatic hyperplasia)   . COPD (chronic obstructive pulmonary disease) (HCC)   . Dementia (HCC)   . Diabetes mellitus without complication (HCC)   . Diabetes mellitus, type II (HCC)   . GERD (gastroesophageal reflux disease)   . High cholesterol   . Hyperlipidemia   . Hypertension   . Overactive bladder   . Schizophrenia (HCC)   . Unspecified dementia with behavioral disturbance [facility])    Past Surgical History:  Procedure Laterality Date  . unable     Social History   Socioeconomic History  . Marital status: Single    Spouse name: Not on file  . Number of children: Not on file  . Years of education: Not on file  . Highest education level: Not on file  Occupational History  . Not on file  Tobacco Use  . Smoking status: Current Every Day Smoker    Packs/day: 0.50    Types: Cigarettes  . Smokeless tobacco: Never Used  Substance and Sexual Activity  . Alcohol use: No  . Drug use: No  . Sexual activity: Not on file  Other Topics Concern  . Not on file  Social History Narrative   ** Merged History Encounter **       ** Merged History Encounter **       Social Determinants of Health   Financial Resource Strain:   . Difficulty of Paying Living Expenses: Not on file  Food Insecurity:   . Worried About Programme researcher, broadcasting/film/video in the Last Year: Not on file  . Ran Out of Food in the Last Year: Not on file  Transportation Needs:   . Lack of Transportation (Medical): Not on file  . Lack of Transportation (Non-Medical): Not on file  Physical Activity:   . Days of Exercise per Week: Not on file  . Minutes of Exercise per Session: Not on file  Stress:   . Feeling of Stress : Not on file  Social Connections:   . Frequency of Communication  with Friends and Family: Not on file  . Frequency of Social Gatherings with Friends and Family: Not on file  . Attends Religious Services: Not on file  . Active Member of Clubs or Organizations: Not on file  . Attends Banker Meetings: Not on file  . Marital Status: Not on file  Intimate Partner Violence:   . Fear of Current or Ex-Partner: Not on file  . Emotionally Abused: Not on file  . Physically Abused: Not on file  . Sexually Abused: Not on file   Current Outpatient Medications on File Prior to Visit  Medication Sig Dispense Refill  . acetaminophen (TYLENOL) 325 MG tablet Take 650 mg by mouth every 6 (six) hours as needed for mild pain or moderate pain.    . bimatoprost (LUMIGAN) 0.01 % SOLN Place 1 drop into both eyes at bedtime.    . carbamazepine (TEGRETOL) 200 MG tablet Take 200 mg by mouth 3 (three) times daily.    . carbamazepine (TEGRETOL) 200 MG tablet Take 200 mg by mouth 3 (three) times daily.    . clonazePAM (KLONOPIN) 0.5 MG tablet Take 0.5 mg by mouth 2 (two)  times daily.     Marland Kitchen donepezil (ARICEPT) 10 MG tablet Take 10 mg by mouth at bedtime.    . folic acid (FOLVITE) 1 MG tablet Take 1 mg by mouth daily.    . insulin detemir (LEVEMIR) 100 UNIT/ML injection Inject 25 Units into the skin every morning.    . Insulin Syringe-Needle U-100 (INSULIN SYRINGE .5CC/31GX5/16") 31G X 5/16" 0.5 ML MISC 1 each by Does not apply route every morning. 100 each 5  . lisinopril (ZESTRIL) 10 MG tablet Take 1 tablet (10 mg total) by mouth daily. 90 tablet 1  . memantine (NAMENDA) 10 MG tablet Take 10 mg by mouth 2 (two) times daily.    . metFORMIN (GLUCOPHAGE) 1000 MG tablet TAKE 1 TABLET BY MOUTH TWICE DAILY. (Patient taking differently: Take 1,000 mg by mouth 2 (two) times daily with a meal. ) 60 tablet 2  . OLANZapine (ZYPREXA) 10 MG tablet Take 10 mg by mouth at bedtime.    Marland Kitchen omeprazole (PRILOSEC) 20 MG capsule Take 20 mg by mouth daily.    Marland Kitchen oxybutynin (DITROPAN) 5 MG  tablet Take 5 mg by mouth 2 (two) times daily.    . povidone-iodine (BETADINE) 10 % external solution Apply 1 application topically daily. Apply to skin between the 4th and 5th toes on both feet once daily    . senna (SENOKOT) 8.6 MG TABS tablet Take 1 tablet by mouth at bedtime.    . senna-docusate (SENOKOT-S) 8.6-50 MG tablet Take 1 tablet by mouth at bedtime.    . simvastatin (ZOCOR) 40 MG tablet Take 40 mg by mouth daily.    . tamsulosin (FLOMAX) 0.4 MG CAPS capsule Take 0.4 mg by mouth daily.    . Vitamin D, Ergocalciferol, (DRISDOL) 1.25 MG (50000 UT) CAPS capsule TAKE 1 TABLET BY MOUTH ONCE WEEKLY. 4 capsule 2   No current facility-administered medications on file prior to visit.    No facility-administered encounter medications on file as of 11/10/2018.    ALLERGIES: No Known Allergies VACCINATION STATUS:  There is no immunization history on file for this patient.  Diabetes  He is accompanied by his nurse from his group home.  His point-of-care A1c 7.4% -remaining stable.  He was diagnosed at approximate age of 79 years.  I spoke with his caretaker and reviewed his care.   He continues to have controlled fasting and postprandial glycemic profile.  No documented or reported major hypoglycemia.      Hyperlipidemia  This is a chronic problem. The current episode started more than 1 year ago. The problem is controlled. Exacerbating diseases include diabetes and obesity. Pertinent negatives include no chest pain, myalgias or shortness of breath. Current antihyperlipidemic treatment includes statins. Risk factors for coronary artery disease include diabetes mellitus, dyslipidemia, hypertension, male sex, obesity and a sedentary lifestyle.     Objective:      Recent Results (from the past 2160 hour(s))  TSH + free T4     Status: None   Collection Time: 09/22/19  7:43 AM  Result Value Ref Range   TSH W/REFLEX TO FT4 1.75 0.40 - 4.50 mIU/L  Hemoglobin A1c     Status: Abnormal    Collection Time: 09/22/19  7:43 AM  Result Value Ref Range   Hgb A1c MFr Bld 7.4 (H) <5.7 % of total Hgb    Comment: For someone without known diabetes, a hemoglobin A1c value of 6.5% or greater indicates that they may have  diabetes and this should be confirmed  with a follow-up  test. . For someone with known diabetes, a value <7% indicates  that their diabetes is well controlled and a value  greater than or equal to 7% indicates suboptimal  control. A1c targets should be individualized based on  duration of diabetes, age, comorbid conditions, and  other considerations. . Currently, no consensus exists regarding use of hemoglobin A1c for diagnosis of diabetes for children. .    Mean Plasma Glucose 166 (calc)   eAG (mmol/L) 9.2 (calc)  Lipid panel     Status: None   Collection Time: 09/22/19  7:43 AM  Result Value Ref Range   Cholesterol 168 <200 mg/dL   HDL 48 > OR = 40 mg/dL   Triglycerides 133 <150 mg/dL   LDL Cholesterol (Calc) 97 mg/dL (calc)    Comment: Reference range: <100 . Desirable range <100 mg/dL for primary prevention;   <70 mg/dL for patients with CHD or diabetic patients  with > or = 2 CHD risk factors. Marland Kitchen LDL-C is now calculated using the Martin-Hopkins  calculation, which is a validated novel method providing  better accuracy than the Friedewald equation in the  estimation of LDL-C.  Cresenciano Genre et al. Annamaria Helling. 8242;353(61): 2061-2068  (http://education.[facility].com/faq/FAQ164)    Total CHOL/HDL Ratio 3.5 <5.0 (calc)   Non-HDL Cholesterol (Calc) 120 <130 mg/dL (calc)    Comment: For patients with diabetes plus 1 major ASCVD risk  factor, treating to a non-HDL-C goal of <100 mg/dL  (LDL-C of <70 mg/dL) is considered a therapeutic  option.   VITAMIN D 25 Hydroxy (Vit-D Deficiency, Fractures)     Status: None   Collection Time: 09/22/19  7:43 AM  Result Value Ref Range   Vit D, 25-Hydroxy 96 30 - 100 ng/mL    Comment: Vitamin D Status         25-OH  Vitamin D: . Deficiency:                    <20 ng/mL Insufficiency:             20 - 29 ng/mL Optimal:                 > or = 30 ng/mL . For 25-OH Vitamin D testing on patients on  D2-supplementation and patients for whom quantitation  of D2 and D3 fractions is required, the [facility]ureD(TM) 25-OH VIT D, (D2,D3), LC/MS/MS is recommended: order  code [phone] (patients >35yrs). See Note 1 . Note 1 . For additional information, please refer to  http://education.[facility].com/faq/FAQ199  (This link is being provided for informational/ educational purposes only.)   COMPLETE METABOLIC PANEL WITH GFR     Status: Abnormal   Collection Time: 09/22/19  7:43 AM  Result Value Ref Range   Glucose, Bld 121 (H) 65 - 99 mg/dL    Comment: .            Fasting reference interval . For someone without known diabetes, a glucose value between 100 and 125 mg/dL is consistent with prediabetes and should be confirmed with a follow-up test. .    BUN 22 7 - 25 mg/dL   Creat 1.01 0.70 - 1.18 mg/dL    Comment: For patients >15 years of age, the reference limit for Creatinine is approximately 13% higher for people identified as African-American. .    GFR, Est Non African American 72 > OR = 60 mL/min/1.30m2   GFR, Est African American 83 > OR = 60 mL/min/1.81m2   BUN/Creatinine  Ratio NOT APPLICABLE 6 - 22 (calc)   Sodium 143 135 - 146 mmol/L   Potassium 5.1 3.5 - 5.3 mmol/L   Chloride 105 98 - 110 mmol/L   CO2 31 20 - 32 mmol/L   Calcium 9.3 8.6 - 10.3 mg/dL   Total Protein 6.4 6.1 - 8.1 g/dL   Albumin 3.9 3.6 - 5.1 g/dL   Globulin 2.5 1.9 - 3.7 g/dL (calc)   AG Ratio 1.6 1.0 - 2.5 (calc)   Total Bilirubin 0.3 0.2 - 1.2 mg/dL   Alkaline phosphatase (APISO) 99 35 - 144 U/L   AST 14 10 - 35 U/L   ALT 13 9 - 46 U/L  CBC w/Diff/Platelet     Status: Abnormal   Collection Time: 09/22/19  7:43 AM  Result Value Ref Range   WBC 5.6 3.8 - 10.8 Thousand/uL   RBC 3.57 (L) 4.20 - 5.80  Million/uL   Hemoglobin 12.4 (L) 13.2 - 17.1 g/dL   HCT 02.7 (L) 74.1 - 28.7 %   MCV 105.3 (H) 80.0 - 100.0 fL   MCH 34.7 (H) 27.0 - 33.0 pg   MCHC 33.0 32.0 - 36.0 g/dL   RDW 86.7 67.2 - 09.4 %   Platelets 252 140 - 400 Thousand/uL   MPV 9.1 7.5 - 12.5 fL   Neutro Abs 1,971 1,500 - 7,800 cells/uL   Lymphs Abs 2,934 850 - 3,900 cells/uL   Absolute Monocytes 554 200 - 950 cells/uL   Eosinophils Absolute 112 15 - 500 cells/uL   Basophils Absolute 28 0 - 200 cells/uL   Neutrophils Relative % 35.2 %   Total Lymphocyte 52.4 %   Monocytes Relative 9.9 %   Eosinophils Relative 2.0 %   Basophils Relative 0.5 %   Lipid Panel     Component Value Date/Time   CHOL 168 09/22/2019 0743   TRIG 133 09/22/2019 0743   HDL 48 09/22/2019 0743   CHOLHDL 3.5 09/22/2019 0743   VLDL 18 10/11/2016 0858   LDLCALC 97 09/22/2019 0743     Assessment & Plan:   1. Uncontrolled type 2 diabetes mellitus without complication, with long-term current use of insulin (HCC)  - He remains at a high risk for more acute and chronic complications of diabetes which include CAD, CVA, CKD, retinopathy, and neuropathy.  -His point-of-care A1c 7.4% today.    - Patient and his caretaker is advised to stick to a routine mealtimes to eat 3 meals  a day and avoid unnecessary snacks ( to snack only to correct hypoglycemia).  - I have approached patient with the following individualized plan to manage diabetes and patient agrees.  -He will continue to benefit from continued simplified treatment regimen.  I advised for his caretaker to continue Levemir 25 units every morning with breakfast, associated with strict monitoring of blood glucose 2 times a day-daily before breakfast and before supper.  He will continue to benefit from metformin therapy, advised to continue metformin 1000 mg p.o. daily after breakfast and after supper.    -He will be considered for low-dose glipizide, if his next A1c is greater than 8%.  2)  Lipids/HPL: His repeat lipid panel showed LDL of 97.  He is advised to continue simvastatin 40 mg p.o. nightly.    3) blood pressure-controlled. 96/60   He does not tolerate blood pressure medication.   He will be observed without BP meds for now.  - I advised patient to maintain close follow up with his PMD for primary  care needs.  - Time spent on this patient care encounter:  35 min, of which > 50% was spent in  counseling and the rest reviewing his blood glucose logs , discussing his hypoglycemia and hyperglycemia episodes, reviewing his current and  previous labs / studies  ( including abstraction from other facilities) and medications  doses and developing a  long term treatment plan and documenting his care.   Please refer to Patient Instructions for Blood Glucose Monitoring and Insulin/Medications Dosing Guide"  in media tab for additional information. Please  also refer to " Patient Self Inventory" in the Media  tab for reviewed elements of pertinent patient history.  Andre Rowe participated in the discussions, expressed understanding, and voiced agreement with the above plans.  All questions were answered to his satisfaction. he is encouraged to contact clinic should he have any questions or concerns prior to his return visit.   Follow up plan: -Return in 3 months with repeat labs, meter/logs. Andre Lunch, MD Phone: [phone]  Fax: [phone]  -  This note was partially dictated with voice recognition software. Similar sounding words can be transcribed inadequately or may not  be corrected upon review.  11/10/2018, 11:42 AM

## 2019-09-25 LAB — URINALYSIS, MICROSCOPIC ONLY
Bacteria, UA: NONE SEEN /HPF
Hyaline Cast: NONE SEEN /LPF
RBC / HPF: NONE SEEN /HPF (ref 0–2)
Squamous Epithelial / HPF: NONE SEEN /HPF (ref <=5)
WBC, UA: NONE SEEN /HPF (ref 0–5)

## 2019-09-26 ENCOUNTER — Other Ambulatory Visit: Payer: Self-pay | Admitting: Family Medicine

## 2019-09-26 DIAGNOSIS — E1169 Type 2 diabetes mellitus with other specified complication: Secondary | ICD-10-CM | POA: Insufficient documentation

## 2019-09-26 DIAGNOSIS — D539 Nutritional anemia, unspecified: Secondary | ICD-10-CM

## 2019-09-28 LAB — HEMOGLOBIN A1C
Hgb A1c MFr Bld: 7.4 % of total Hgb — ABNORMAL HIGH (ref <5.7)
Mean Plasma Glucose: 166 (calc)
eAG (mmol/L): 9.2 (calc)

## 2019-09-28 LAB — COMPLETE METABOLIC PANEL WITH GFR
AG Ratio: 1.6 (calc) (ref 1.0–2.5)
ALT: 13 U/L (ref 9–46)
AST: 14 U/L (ref 10–35)
Albumin: 3.9 g/dL (ref 3.6–5.1)
Alkaline phosphatase (APISO): 99 U/L (ref 35–144)
BUN: 22 mg/dL (ref 7–25)
CO2: 31 mmol/L (ref 20–32)
Calcium: 9.3 mg/dL (ref 8.6–10.3)
Chloride: 105 mmol/L (ref 98–110)
Creat: 1.01 mg/dL (ref 0.70–1.18)
GFR, Est African American: 83 mL/min/{1.73_m2} (ref >=60)
GFR, Est Non African American: 72 mL/min/{1.73_m2} (ref >=60)
Globulin: 2.5 g/dL (calc) (ref 1.9–3.7)
Glucose, Bld: 121 mg/dL — ABNORMAL HIGH (ref 65–99)
Potassium: 5.1 mmol/L (ref 3.5–5.3)
Sodium: 143 mmol/L (ref 135–146)
Total Bilirubin: 0.3 mg/dL (ref 0.2–1.2)
Total Protein: 6.4 g/dL (ref 6.1–8.1)

## 2019-09-28 LAB — CBC WITH DIFFERENTIAL/PLATELET
Absolute Monocytes: 554 cells/uL (ref 200–950)
Basophils Absolute: 28 cells/uL (ref 0–200)
Basophils Relative: 0.5 %
Eosinophils Absolute: 112 cells/uL (ref 15–500)
Eosinophils Relative: 2 %
HCT: 37.6 % — ABNORMAL LOW (ref 38.5–50.0)
Hemoglobin: 12.4 g/dL — ABNORMAL LOW (ref 13.2–17.1)
Lymphs Abs: 2934 cells/uL (ref 850–3900)
MCH: 34.7 pg — ABNORMAL HIGH (ref 27.0–33.0)
MCHC: 33 g/dL (ref 32.0–36.0)
MCV: 105.3 fL — ABNORMAL HIGH (ref 80.0–100.0)
MPV: 9.1 fL (ref 7.5–12.5)
Monocytes Relative: 9.9 %
Neutro Abs: 1971 cells/uL (ref 1500–7800)
Neutrophils Relative %: 35.2 %
Platelets: 252 10*3/uL (ref 140–400)
RBC: 3.57 10*6/uL — ABNORMAL LOW (ref 4.20–5.80)
RDW: 13.8 % (ref 11.0–15.0)
Total Lymphocyte: 52.4 %
WBC: 5.6 10*3/uL (ref 3.8–10.8)

## 2019-09-28 LAB — B12 AND FOLATE PANEL
Folate: 21.1 ng/mL
Vitamin B-12: 412 pg/mL (ref 200–1100)

## 2019-09-28 LAB — TEST AUTHORIZATION

## 2019-09-28 LAB — LIPID PANEL
Cholesterol: 168 mg/dL (ref <200)
HDL: 48 mg/dL (ref >=40)
LDL Cholesterol (Calc): 97 mg/dL (calc)
Non-HDL Cholesterol (Calc): 120 mg/dL (calc) (ref <130)
Total CHOL/HDL Ratio: 3.5 (calc) (ref <5.0)
Triglycerides: 133 mg/dL (ref <150)

## 2019-09-28 LAB — TSH+FREE T4: TSH W/REFLEX TO FT4: 1.75 mIU/L (ref 0.40–4.50)

## 2019-09-28 LAB — VITAMIN D 25 HYDROXY (VIT D DEFICIENCY, FRACTURES): Vit D, 25-Hydroxy: 96 ng/mL (ref 30–100)

## 2019-10-12 ENCOUNTER — Telehealth: Payer: Self-pay | Admitting: Family Medicine

## 2019-10-12 NOTE — Telephone Encounter (Signed)
Please advise 

## 2019-10-12 NOTE — Telephone Encounter (Signed)
Ed is calling from [employer] and states the patient was put on lisinopril (ZESTRIL) 10 MG tablet  And was told to discontinue the 20mg . They are needing clarification, CB# [phone]

## 2019-10-12 NOTE — Telephone Encounter (Signed)
Vonshel is calling from [facility] and states the lisinopril 10mg  is still making the patient blood pressure low and she feels it is best to DC this. She would like a call back.   [phone]

## 2019-10-12 NOTE — Telephone Encounter (Signed)
Please discontinue lisinopril Continue to check blood pressure

## 2019-10-12 NOTE — Telephone Encounter (Signed)
I spoke with ED and confirmed to dose is in fact 10mg .

## 2019-10-13 ENCOUNTER — Other Ambulatory Visit: Payer: Self-pay | Admitting: Emergency Medicine

## 2019-10-13 NOTE — Telephone Encounter (Signed)
Vonshel called back and states and order needs to be sent to [facility] stating to discontinue Lisinopril and continue checking blood pressure.   [facility] [phone]

## 2019-10-13 NOTE — Telephone Encounter (Signed)
Lisinopril has been discontinued in patient's chart and sent to [facility]

## 2019-10-13 NOTE — Telephone Encounter (Signed)
Left a msg on Vonshel machine with detail msg below

## 2019-10-27 DIAGNOSIS — Z20828 Contact with and (suspected) exposure to other viral communicable diseases: Secondary | ICD-10-CM | POA: Diagnosis not present

## 2019-11-01 ENCOUNTER — Telehealth: Payer: Self-pay | Admitting: Family Medicine

## 2019-11-01 NOTE — Telephone Encounter (Signed)
Discontinued med has been manually faxed to [facility]

## 2019-11-01 NOTE — Telephone Encounter (Signed)
Patient family care called and said that [facility] is still sending out lisinopril and she called them and they say they do not have a DC order for this. Please advise

## 2019-11-01 NOTE — Telephone Encounter (Signed)
TM 

## 2019-11-01 NOTE — Telephone Encounter (Signed)
.  tm

## 2019-11-03 DIAGNOSIS — Z20828 Contact with and (suspected) exposure to other viral communicable diseases: Secondary | ICD-10-CM | POA: Diagnosis not present

## 2019-11-10 DIAGNOSIS — Z20828 Contact with and (suspected) exposure to other viral communicable diseases: Secondary | ICD-10-CM | POA: Diagnosis not present

## 2019-11-16 DIAGNOSIS — E1142 Type 2 diabetes mellitus with diabetic polyneuropathy: Secondary | ICD-10-CM | POA: Diagnosis not present

## 2019-11-16 DIAGNOSIS — M4712 Other spondylosis with myelopathy, cervical region: Secondary | ICD-10-CM | POA: Diagnosis not present

## 2019-11-16 DIAGNOSIS — F0390 Unspecified dementia without behavioral disturbance: Secondary | ICD-10-CM | POA: Diagnosis not present

## 2019-11-16 DIAGNOSIS — R296 Repeated falls: Secondary | ICD-10-CM | POA: Diagnosis not present

## 2019-11-17 DIAGNOSIS — Z20828 Contact with and (suspected) exposure to other viral communicable diseases: Secondary | ICD-10-CM | POA: Diagnosis not present

## 2019-11-17 DIAGNOSIS — R05 Cough: Secondary | ICD-10-CM | POA: Diagnosis not present

## 2019-11-24 DIAGNOSIS — R05 Cough: Secondary | ICD-10-CM | POA: Diagnosis not present

## 2019-11-24 DIAGNOSIS — Z20828 Contact with and (suspected) exposure to other viral communicable diseases: Secondary | ICD-10-CM | POA: Diagnosis not present

## 2019-11-27 ENCOUNTER — Emergency Department (HOSPITAL_COMMUNITY): Payer: Medicare Other

## 2019-11-27 ENCOUNTER — Inpatient Hospital Stay (HOSPITAL_COMMUNITY)
Admission: EM | Admit: 2019-11-27 | Discharge: 2019-12-21 | DRG: 092 | Disposition: A | Discharge status: Skilled Nursing Facility | Payer: Medicare Other | Attending: Internal Medicine | Admitting: Internal Medicine

## 2019-11-27 ENCOUNTER — Observation Stay (HOSPITAL_COMMUNITY): Payer: Medicare Other

## 2019-11-27 ENCOUNTER — Other Ambulatory Visit: Payer: Self-pay

## 2019-11-27 ENCOUNTER — Encounter (HOSPITAL_COMMUNITY): Payer: Self-pay | Admitting: Emergency Medicine

## 2019-11-27 DIAGNOSIS — R296 Repeated falls: Secondary | ICD-10-CM | POA: Diagnosis not present

## 2019-11-27 DIAGNOSIS — W1830XA Fall on same level, unspecified, initial encounter: Secondary | ICD-10-CM | POA: Diagnosis present

## 2019-11-27 DIAGNOSIS — F0391 Unspecified dementia with behavioral disturbance: Secondary | ICD-10-CM

## 2019-11-27 DIAGNOSIS — S0990XA Unspecified injury of head, initial encounter: Secondary | ICD-10-CM | POA: Diagnosis not present

## 2019-11-27 DIAGNOSIS — R270 Ataxia, unspecified: Principal | ICD-10-CM | POA: Diagnosis present

## 2019-11-27 DIAGNOSIS — S299XXA Unspecified injury of thorax, initial encounter: Secondary | ICD-10-CM | POA: Diagnosis not present

## 2019-11-27 DIAGNOSIS — E11649 Type 2 diabetes mellitus with hypoglycemia without coma: Secondary | ICD-10-CM | POA: Diagnosis not present

## 2019-11-27 DIAGNOSIS — R630 Anorexia: Secondary | ICD-10-CM | POA: Diagnosis present

## 2019-11-27 DIAGNOSIS — E118 Type 2 diabetes mellitus with unspecified complications: Secondary | ICD-10-CM

## 2019-11-27 DIAGNOSIS — G309 Alzheimer's disease, unspecified: Secondary | ICD-10-CM | POA: Diagnosis not present

## 2019-11-27 DIAGNOSIS — S199XXA Unspecified injury of neck, initial encounter: Secondary | ICD-10-CM | POA: Diagnosis not present

## 2019-11-27 DIAGNOSIS — E1165 Type 2 diabetes mellitus with hyperglycemia: Secondary | ICD-10-CM | POA: Diagnosis not present

## 2019-11-27 DIAGNOSIS — F209 Schizophrenia, unspecified: Secondary | ICD-10-CM | POA: Diagnosis not present

## 2019-11-27 DIAGNOSIS — E1169 Type 2 diabetes mellitus with other specified complication: Secondary | ICD-10-CM | POA: Diagnosis not present

## 2019-11-27 DIAGNOSIS — N400 Enlarged prostate without lower urinary tract symptoms: Secondary | ICD-10-CM | POA: Diagnosis not present

## 2019-11-27 DIAGNOSIS — Z20822 Contact with and (suspected) exposure to covid-19: Secondary | ICD-10-CM | POA: Diagnosis not present

## 2019-11-27 DIAGNOSIS — G319 Degenerative disease of nervous system, unspecified: Secondary | ICD-10-CM | POA: Diagnosis present

## 2019-11-27 DIAGNOSIS — W19XXXA Unspecified fall, initial encounter: Secondary | ICD-10-CM

## 2019-11-27 DIAGNOSIS — F17210 Nicotine dependence, cigarettes, uncomplicated: Secondary | ICD-10-CM | POA: Diagnosis present

## 2019-11-27 DIAGNOSIS — E782 Mixed hyperlipidemia: Secondary | ICD-10-CM | POA: Diagnosis present

## 2019-11-27 DIAGNOSIS — J449 Chronic obstructive pulmonary disease, unspecified: Secondary | ICD-10-CM | POA: Diagnosis present

## 2019-11-27 DIAGNOSIS — E872 Acidosis: Secondary | ICD-10-CM | POA: Diagnosis not present

## 2019-11-27 DIAGNOSIS — W182XXA Fall in (into) shower or empty bathtub, initial encounter: Secondary | ICD-10-CM | POA: Diagnosis present

## 2019-11-27 DIAGNOSIS — F03918 Unspecified dementia, unspecified severity, with other behavioral disturbance: Secondary | ICD-10-CM | POA: Diagnosis present

## 2019-11-27 DIAGNOSIS — I10 Essential (primary) hypertension: Secondary | ICD-10-CM | POA: Diagnosis not present

## 2019-11-27 DIAGNOSIS — F0281 Dementia in other diseases classified elsewhere with behavioral disturbance: Secondary | ICD-10-CM | POA: Diagnosis not present

## 2019-11-27 DIAGNOSIS — M25512 Pain in left shoulder: Secondary | ICD-10-CM | POA: Diagnosis not present

## 2019-11-27 DIAGNOSIS — R2689 Other abnormalities of gait and mobility: Secondary | ICD-10-CM | POA: Diagnosis not present

## 2019-11-27 DIAGNOSIS — I959 Hypotension, unspecified: Secondary | ICD-10-CM | POA: Diagnosis not present

## 2019-11-27 DIAGNOSIS — Z79899 Other long term (current) drug therapy: Secondary | ICD-10-CM | POA: Diagnosis not present

## 2019-11-27 DIAGNOSIS — K5900 Constipation, unspecified: Secondary | ICD-10-CM | POA: Diagnosis not present

## 2019-11-27 DIAGNOSIS — E785 Hyperlipidemia, unspecified: Secondary | ICD-10-CM | POA: Diagnosis not present

## 2019-11-27 DIAGNOSIS — R109 Unspecified abdominal pain: Secondary | ICD-10-CM | POA: Diagnosis not present

## 2019-11-27 DIAGNOSIS — Z8679 Personal history of other diseases of the circulatory system: Secondary | ICD-10-CM | POA: Diagnosis not present

## 2019-11-27 DIAGNOSIS — R4182 Altered mental status, unspecified: Secondary | ICD-10-CM | POA: Diagnosis present

## 2019-11-27 DIAGNOSIS — K219 Gastro-esophageal reflux disease without esophagitis: Secondary | ICD-10-CM | POA: Diagnosis not present

## 2019-11-27 DIAGNOSIS — E7800 Pure hypercholesterolemia, unspecified: Secondary | ICD-10-CM | POA: Diagnosis not present

## 2019-11-27 DIAGNOSIS — N3281 Overactive bladder: Secondary | ICD-10-CM | POA: Diagnosis present

## 2019-11-27 DIAGNOSIS — S4992XA Unspecified injury of left shoulder and upper arm, initial encounter: Secondary | ICD-10-CM | POA: Diagnosis not present

## 2019-11-27 DIAGNOSIS — Z794 Long term (current) use of insulin: Secondary | ICD-10-CM | POA: Diagnosis not present

## 2019-11-27 DIAGNOSIS — Z03818 Encounter for observation for suspected exposure to other biological agents ruled out: Secondary | ICD-10-CM | POA: Diagnosis not present

## 2019-11-27 DIAGNOSIS — Y93E1 Activity, personal bathing and showering: Secondary | ICD-10-CM

## 2019-11-27 DIAGNOSIS — R2681 Unsteadiness on feet: Secondary | ICD-10-CM

## 2019-11-27 LAB — CBC WITH DIFFERENTIAL/PLATELET
Abs Immature Granulocytes: 0.04 10*3/uL (ref 0.00–0.07)
Basophils Absolute: 0 10*3/uL (ref 0.0–0.1)
Basophils Relative: 1 %
Eosinophils Absolute: 0.1 10*3/uL (ref 0.0–0.5)
Eosinophils Relative: 2 %
HCT: 37.2 % — ABNORMAL LOW (ref 39.0–52.0)
Hemoglobin: 11.6 g/dL — ABNORMAL LOW (ref 13.0–17.0)
Immature Granulocytes: 1 %
Lymphocytes Relative: 41 %
Lymphs Abs: 3.3 10*3/uL (ref 0.7–4.0)
MCH: 34.6 pg — ABNORMAL HIGH (ref 26.0–34.0)
MCHC: 31.2 g/dL (ref 30.0–36.0)
MCV: 111 fL — ABNORMAL HIGH (ref 80.0–100.0)
Monocytes Absolute: 0.9 10*3/uL (ref 0.1–1.0)
Monocytes Relative: 11 %
Neutro Abs: 3.6 10*3/uL (ref 1.7–7.7)
Neutrophils Relative %: 44 %
Platelets: 253 10*3/uL (ref 150–400)
RBC: 3.35 MIL/uL — ABNORMAL LOW (ref 4.22–5.81)
RDW: 15.1 % (ref 11.5–15.5)
WBC: 8 10*3/uL (ref 4.0–10.5)
nRBC: 0 % (ref 0.0–0.2)

## 2019-11-27 LAB — COMPREHENSIVE METABOLIC PANEL
ALT: 15 U/L (ref 0–44)
AST: 13 U/L — ABNORMAL LOW (ref 15–41)
Albumin: 3.4 g/dL — ABNORMAL LOW (ref 3.5–5.0)
Alkaline Phosphatase: 79 U/L (ref 38–126)
Anion gap: 8 (ref 5–15)
BUN: 20 mg/dL (ref 8–23)
CO2: 28 mmol/L (ref 22–32)
Calcium: 8.6 mg/dL — ABNORMAL LOW (ref 8.9–10.3)
Chloride: 103 mmol/L (ref 98–111)
Creatinine, Ser: 0.88 mg/dL (ref 0.61–1.24)
GFR calc Af Amer: >60 mL/min (ref >60)
GFR calc non Af Amer: >60 mL/min (ref >60)
Glucose, Bld: 192 mg/dL — ABNORMAL HIGH (ref 70–99)
Potassium: 4.6 mmol/L (ref 3.5–5.1)
Sodium: 139 mmol/L (ref 135–145)
Total Bilirubin: 0.4 mg/dL (ref 0.3–1.2)
Total Protein: 6.1 g/dL — ABNORMAL LOW (ref 6.5–8.1)

## 2019-11-27 LAB — GLUCOSE, CAPILLARY: Glucose-Capillary: 152 mg/dL — ABNORMAL HIGH (ref 70–99)

## 2019-11-27 LAB — URINALYSIS, ROUTINE W REFLEX MICROSCOPIC
Bacteria, UA: NONE SEEN
Bilirubin Urine: NEGATIVE
Glucose, UA: 150 mg/dL — AB
Ketones, ur: NEGATIVE mg/dL
Leukocytes,Ua: NEGATIVE
Nitrite: NEGATIVE
Protein, ur: NEGATIVE mg/dL
Specific Gravity, Urine: 1.021 (ref 1.005–1.030)
pH: 5 (ref 5.0–8.0)

## 2019-11-27 MED ORDER — OLANZAPINE 2.5 MG PO TABS
10.0000 mg | ORAL_TABLET | Freq: Every day | ORAL | Status: DC
  Administered 2019-11-27 – 2019-12-20 (×24): 10 mg via ORAL
  Filled 2019-11-27 (×24): qty 4

## 2019-11-27 MED ORDER — OXYBUTYNIN CHLORIDE 5 MG PO TABS
5.0000 mg | ORAL_TABLET | Freq: Two times a day (BID) | ORAL | Status: DC
  Administered 2019-11-27 – 2019-12-21 (×47): 5 mg via ORAL
  Filled 2019-11-27 (×47): qty 1

## 2019-11-27 MED ORDER — POLYETHYLENE GLYCOL 3350 17 G PO PACK
17.0000 g | PACK | Freq: Every day | ORAL | Status: DC | PRN
  Administered 2019-12-09: 17 g via ORAL
  Filled 2019-11-27: qty 1

## 2019-11-27 MED ORDER — LATANOPROST 0.005 % OP SOLN
1.0000 [drp] | Freq: Every day | OPHTHALMIC | Status: DC
  Administered 2019-11-27 – 2019-12-20 (×24): 1 [drp] via OPHTHALMIC
  Filled 2019-11-27: qty 2.5

## 2019-11-27 MED ORDER — LISINOPRIL 20 MG PO TABS
20.0000 mg | ORAL_TABLET | Freq: Every day | ORAL | Status: DC
  Administered 2019-11-28 – 2019-12-07 (×10): 20 mg via ORAL
  Filled 2019-11-27 (×12): qty 1

## 2019-11-27 MED ORDER — INSULIN DETEMIR 100 UNIT/ML ~~LOC~~ SOLN
25.0000 [IU] | Freq: Every morning | SUBCUTANEOUS | Status: DC
  Administered 2019-11-28 – 2019-12-03 (×6): 25 [IU] via SUBCUTANEOUS
  Filled 2019-11-27 (×7): qty 0.25

## 2019-11-27 MED ORDER — LATANOPROST 0.005 % OP SOLN: 1.0000 [drp] | Freq: Every day | OPHTHALMIC | Status: DC

## 2019-11-27 MED ORDER — BISACODYL 5 MG PO TBEC
5.0000 mg | DELAYED_RELEASE_TABLET | Freq: Every day | ORAL | Status: DC | PRN
  Administered 2019-11-28 – 2019-12-07 (×2): 5 mg via ORAL
  Filled 2019-11-27 (×2): qty 1

## 2019-11-27 MED ORDER — DONEPEZIL HCL 10 MG PO TABS
10.0000 mg | ORAL_TABLET | Freq: Every day | ORAL | Status: DC
  Administered 2019-11-27 – 2019-12-20 (×24): 10 mg via ORAL
  Filled 2019-11-27 (×24): qty 1

## 2019-11-27 MED ORDER — METFORMIN HCL 500 MG PO TABS
1000.0000 mg | ORAL_TABLET | Freq: Two times a day (BID) | ORAL | Status: DC
  Administered 2019-11-28 – 2019-12-04 (×12): 1000 mg via ORAL
  Filled 2019-11-27 (×13): qty 2

## 2019-11-27 MED ORDER — SIMVASTATIN 20 MG PO TABS
40.0000 mg | ORAL_TABLET | Freq: Every day | ORAL | Status: DC
  Administered 2019-11-28 – 2019-12-21 (×23): 40 mg via ORAL
  Filled 2019-11-27 (×25): qty 2

## 2019-11-27 MED ORDER — ONDANSETRON HCL 4 MG PO TABS: 4.0000 mg | ORAL_TABLET | Freq: Four times a day (QID) | ORAL | Status: DC | PRN

## 2019-11-27 MED ORDER — INSULIN ASPART 100 UNIT/ML ~~LOC~~ SOLN
0.0000 [IU] | Freq: Three times a day (TID) | SUBCUTANEOUS | Status: DC
  Administered 2019-12-01: 2 [IU] via SUBCUTANEOUS

## 2019-11-27 MED ORDER — ONDANSETRON HCL 4 MG/2ML IJ SOLN: 4.0000 mg | Freq: Four times a day (QID) | INTRAMUSCULAR | Status: DC | PRN

## 2019-11-27 MED ORDER — ENOXAPARIN SODIUM 40 MG/0.4ML ~~LOC~~ SOLN
40.0000 mg | Freq: Every day | SUBCUTANEOUS | Status: DC
  Administered 2019-11-28 – 2019-12-21 (×24): 40 mg via SUBCUTANEOUS
  Filled 2019-11-27 (×24): qty 0.4

## 2019-11-27 MED ORDER — MEMANTINE HCL 10 MG PO TABS
10.0000 mg | ORAL_TABLET | Freq: Two times a day (BID) | ORAL | Status: DC
  Administered 2019-11-27 – 2019-12-21 (×47): 10 mg via ORAL
  Filled 2019-11-27 (×48): qty 1

## 2019-11-27 MED ORDER — CARBAMAZEPINE 200 MG PO TABS
200.0000 mg | ORAL_TABLET | Freq: Three times a day (TID) | ORAL | Status: DC
  Administered 2019-11-27 – 2019-12-21 (×68): 200 mg via ORAL
  Filled 2019-11-27 (×71): qty 1

## 2019-11-27 MED ORDER — CLONAZEPAM 0.5 MG PO TABS
0.5000 mg | ORAL_TABLET | Freq: Two times a day (BID) | ORAL | Status: DC
  Administered 2019-11-27 – 2019-12-08 (×20): 0.5 mg via ORAL
  Filled 2019-11-27 (×21): qty 1

## 2019-11-27 MED ORDER — INSULIN ASPART 100 UNIT/ML ~~LOC~~ SOLN: 0.0000 [IU] | Freq: Every day | SUBCUTANEOUS | Status: DC

## 2019-11-27 MED ORDER — TAMSULOSIN HCL 0.4 MG PO CAPS
0.4000 mg | ORAL_CAPSULE | Freq: Every day | ORAL | Status: DC
  Administered 2019-11-28 – 2019-12-21 (×23): 0.4 mg via ORAL
  Filled 2019-11-27 (×25): qty 1

## 2019-11-27 NOTE — ED Notes (Signed)
Continues to await [facility]

## 2019-11-27 NOTE — ED Notes (Signed)
This pt will need social work placement

## 2019-11-27 NOTE — ED Notes (Signed)
Pt wet diaper changed   Pt with scatological speech  Calling staff mother fuckers at one breath and blowing kisses speaking of making babies in the next   Comfort measures and pt calmed restign and awaiting [facility]  Repot to [place], [employer]

## 2019-11-27 NOTE — ED Notes (Signed)
Call to [facility] [phone]  Gershon Cull, RN will be the RN assuming care  She is unable to take report and will call us back

## 2019-11-27 NOTE — ED Notes (Signed)
Call to [facility] "Administrator of [facility]  She reports that the pt has no Guardian He has lived at the facility for the last 15 years  They can no longer care for him as he needs a higher level of care   Her number [phone]   Call to [phone] Person answering phone reports she will call Andre Rowe to talk to [place]  Andre Rowe if family member  Due to no guardian, implied consent for transfer  Due to pt altered LOC

## 2019-11-27 NOTE — Progress Notes (Signed)
Pt admitted to the unit from [facility] via [facility]. Pt Alert and verbally responsive; IV intact, pt oriented to the unit and room, fall safety precaution and prevention initiated; telemetry applied and verified with CCMD, second RN called to second verify; pt skin intact with no pressure ulcer or opened wound noted, skin assessment completed per protocol with second RN. Pt in bed with call light within reach, bed alarm on, safety measures in place. Will continue to closely monitor pt. Dionne Bucy RN   11/27/19 2215  Vitals  Temp 99 F (37.2 C)  Temp Source Oral  BP (!) 158/97  MAP (mmHg) 116  BP Location Left Arm  BP Method Automatic  Patient Position (if appropriate) Lying  Pulse Rate 86  Pulse Rate Source Monitor  ECG Heart Rate 86  Resp 15  Level of Consciousness  Level of Consciousness Alert  Oxygen Therapy  SpO2 98 %  O2 Device Room Air  Pain Assessment  Pain Scale 0-10  Pain Score 0  MEWS Score  MEWS Temp 0  MEWS Systolic 0  MEWS Pulse 0  MEWS RR 0  MEWS LOC 0  MEWS Score 0  MEWS Score Color Green

## 2019-11-27 NOTE — ED Notes (Signed)
Pt from [facility]   Fall x 2 today  Has hx of dementia/Alzheimers  Usual mentation   Care home will not take back as "too much care" per report  Here to board

## 2019-11-27 NOTE — ED Provider Notes (Signed)
Emergency Department Provider Note   I have reviewed the triage vital signs and the nursing notes.   HISTORY  Chief Complaint Fall   HPI Andre Rowe is a 79 y.o. male with past medical history reviewed below including Alzheimer's disease presents to the emergency department with his caregiver after 2 falls today.  Patient at baseline requires some guidance and gently holding a hand or arm while he is walking.  The caregiver states that over the past several weeks he seems to be requiring some more support but has not had falls until today.  He fell once in the shower after seeming to lose his balance but the caregiver was able to catch him.  The second fall he was out on the porch and the caregiver states he saw him stand up and then fall to his left.  He landed on the left side of his body.  He has been at his mental status baseline.  He denies any new medications.  They have not noticed any obvious painful area.  The patient has been ambulatory since the fall.   Level 5 caveat: Dementia   Past Medical History:  Diagnosis Date  . Alzheimer disease ([place])   . BPH (benign prostatic hyperplasia)   . COPD (chronic obstructive pulmonary disease) ([place])   . Dementia ([place])   . Diabetes mellitus without complication ([place])   . Diabetes mellitus, type II ([place])   . GERD (gastroesophageal reflux disease)   . High cholesterol   . Hyperlipidemia   . Hypertension   . Overactive bladder   . Schizophrenia ([place])   . Unspecified dementia with behavioral disturbance [facility])     Patient Active Problem List   Diagnosis Date Noted  . 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 Hyperlipidemia associated with type 2 diabetes mellitus ([place]) 09/26/2019  . Uncontrolled type 2 diabetes mellitus with hyperglycemia ([place]) 09/24/2019  . Unspecified dementia with behavioral disturbance ([place])   . SDH (subdural hematoma) ([place]) 10/26/2016  . Subdural hematoma, acute ([place]) 10/25/2016  . Subdural hematoma ([place]) 10/25/2016  .  Vitamin D deficiency 12/08/2015  . Mixed hyperlipidemia 08/02/2015  . Cellulitis of leg, left 12/11/2014  . Controlled type 2 diabetes mellitus with complication, with Greyson Peavy-term current use of insulin ([place]) 12/08/2014  . Dementia ([place]) 12/08/2014  . Schizophrenia ([place]) 12/08/2014  . Essential hypertension 12/08/2014  . HCAP (healthcare-associated pneumonia) 12/08/2014    Past Surgical History:  Procedure Laterality Date  . unable      Allergies Patient has no known allergies.  Family History  Family history unknown: Yes    Social History Social History   Tobacco Use  . Smoking status: Current Every Day Smoker    Packs/day: 0.50    Types: Cigarettes  . Smokeless tobacco: Never Used  Substance Use Topics  . Alcohol use: No  . Drug use: No    Review of Systems  Level 5 caveat: Dementia   ____________________________________________   PHYSICAL EXAM:  VITAL SIGNS: ED Triage Vitals  Enc Vitals Group     BP 11/27/19 1442 126/79     Pulse Rate 11/27/19 1442 (!) 105     Resp 11/27/19 1442 18     Temp 11/27/19 1442 98.5 F (36.9 C)     Temp Source 11/27/19 1442 Oral     SpO2 11/27/19 1442 95 %     Weight 11/27/19 1443 207 lb (93.9 kg)     Height 11/27/19 1443 5\' 6"  (1.676 m)   Constitutional: Alert but confused.  Well appearing and in no acute distress. Eyes: Conjunctivae are normal. PERRL.  Head: Atraumatic. Nose: No congestion/rhinnorhea. Mouth/Throat: Mucous membranes are moist.  Neck: No stridor.  No cervical spine tenderness to palpation. Cardiovascular: Normal rate, regular rhythm. Good peripheral circulation. Grossly normal heart sounds.   Respiratory: Normal respiratory effort.  No retractions. Lungs CTAB. Gastrointestinal: Soft and nontender. No distention.  Musculoskeletal: No lower extremity tenderness nor edema. No gross deformities of extremities.  Normal passive range of motion of the bilateral upper and lower extremities without bony tenderness  over the left shoulder, elbow, wrist.  Neurologic:  Normal speech and language. Moving all extremities equally.  Patient stands with some assistance but with a walking he quickly loses balance and begins to fall to the side.  His gait is very wide-based and unsteady.  Skin:  Skin is warm, dry and intact. No rash noted.  ____________________________________________   LABS (all labs ordered are listed, but only abnormal results are displayed)  Labs Reviewed  COMPREHENSIVE METABOLIC PANEL - Abnormal; Notable for the following components:      Result Value   Glucose, Bld 192 (*)    Calcium 8.6 (*)    Total Protein 6.1 (*)    Albumin 3.4 (*)    AST 13 (*)    All other components within normal limits  CBC WITH DIFFERENTIAL/PLATELET - Abnormal; Notable for the following components:   RBC 3.35 (*)    Hemoglobin 11.6 (*)    HCT 37.2 (*)    MCV 111.0 (*)    MCH 34.6 (*)    All other components within normal limits  URINALYSIS, ROUTINE W REFLEX MICROSCOPIC - Abnormal; Notable for the following components:   Glucose, UA 150 (*)    Hgb urine dipstick SMALL (*)    All other components within normal limits  SARS CORONAVIRUS 2 (TAT 6-24 HRS)   ____________________________________________  EKG  EKG: 15:54   HR: 100 PR: 147 QTc: 451  Sinus rhythm. Narrow QRS. Old inferior infarct. Normal T waves. No STEMI  ____________________________________________  RADIOLOGY  DG Chest 2 View  Result Date: 11/27/2019 CLINICAL DATA:  Fall, dementia EXAM: CHEST - 2 VIEW COMPARISON:  04/22/2017 FINDINGS: The heart size and mediastinal contours are within normal limits. Low lung volumes with streaky bibasilar opacities. No pleural effusion or pneumothorax. No acute osseous finding. IMPRESSION: Low lung volumes with streaky bibasilar opacities, likely atelectasis. Electronically Signed   By: Duanne Guess D.O.   On: 11/27/2019 15:45   CT Head Wo Contrast  Result Date: 11/27/2019 CLINICAL DATA:  Head  trauma EXAM: CT HEAD WITHOUT CONTRAST CT CERVICAL SPINE WITHOUT CONTRAST TECHNIQUE: Multidetector CT imaging of the head and cervical spine was performed following the standard protocol without intravenous contrast. Multiplanar CT image reconstructions of the cervical spine were also generated. COMPARISON:  None. 06/09/2017 FINDINGS: CT HEAD FINDINGS Brain: No evidence of acute infarction, hemorrhage, hydrocephalus, extra-axial collection or mass lesion/mass effect. Signs of atrophy and chronic microvascular ischemic change are similar to the prior study. Vascular: No hyperdense vessel or unexpected calcification. Skull: Normal. Negative for fracture or focal lesion. Sinuses/Orbits: Visualized paranasal sinuses and orbits are unremarkable. Other: Lenticular areas of low density about the left orbit, 1 likely within the left eyelid measuring 18 x 11 mm. This was present on the prior exam measuring approximately 14 by 8 mm. This is well-circumscribed and likely amenable to direct clinical inspection. CT CERVICAL SPINE FINDINGS Alignment: Reversal of normal cervical lordosis in the upper cervical spine  in the setting of degenerative change. Skull base and vertebrae: No acute fracture. No primary bone lesion or focal pathologic process. Soft tissues and spinal canal: No prevertebral fluid or swelling. No visible canal hematoma. Disc levels: Multilevel spinal degenerative change is similar to the prior study most pronounced at C3-4, C4-5 and C5-6. Also at C6-7. Uncovertebral spurring is noted at multiple levels along with facet hypertrophy. Facet hypertrophy is greatest on the right at C2-3. Upper chest: Emphysematous changes at the lung apices. Other: None IMPRESSION: 1. No acute intracranial abnormality. 2. Signs of atrophy and chronic microvascular ischemic change. 3. Lenticular areas of low density about the left orbit, 1 likely within the left eyelid measuring 18 x 11 mm. This was present on the prior exam measuring  approximately 14 by 8 mm. This is well-circumscribed and likely amenable to direct clinical inspection. These may represent sebaceous cysts. 4. Multilevel spinal degenerative changes without acute fracture. Electronically Signed   By: Donzetta Kohut M.D.   On: 11/27/2019 16:33   CT Cervical Spine Wo Contrast  Result Date: 11/27/2019 CLINICAL DATA:  Head trauma EXAM: CT HEAD WITHOUT CONTRAST CT CERVICAL SPINE WITHOUT CONTRAST TECHNIQUE: Multidetector CT imaging of the head and cervical spine was performed following the standard protocol without intravenous contrast. Multiplanar CT image reconstructions of the cervical spine were also generated. COMPARISON:  None. 06/09/2017 FINDINGS: CT HEAD FINDINGS Brain: No evidence of acute infarction, hemorrhage, hydrocephalus, extra-axial collection or mass lesion/mass effect. Signs of atrophy and chronic microvascular ischemic change are similar to the prior study. Vascular: No hyperdense vessel or unexpected calcification. Skull: Normal. Negative for fracture or focal lesion. Sinuses/Orbits: Visualized paranasal sinuses and orbits are unremarkable. Other: Lenticular areas of low density about the left orbit, 1 likely within the left eyelid measuring 18 x 11 mm. This was present on the prior exam measuring approximately 14 by 8 mm. This is well-circumscribed and likely amenable to direct clinical inspection. CT CERVICAL SPINE FINDINGS Alignment: Reversal of normal cervical lordosis in the upper cervical spine in the setting of degenerative change. Skull base and vertebrae: No acute fracture. No primary bone lesion or focal pathologic process. Soft tissues and spinal canal: No prevertebral fluid or swelling. No visible canal hematoma. Disc levels: Multilevel spinal degenerative change is similar to the prior study most pronounced at C3-4, C4-5 and C5-6. Also at C6-7. Uncovertebral spurring is noted at multiple levels along with facet hypertrophy. Facet hypertrophy is  greatest on the right at C2-3. Upper chest: Emphysematous changes at the lung apices. Other: None IMPRESSION: 1. No acute intracranial abnormality. 2. Signs of atrophy and chronic microvascular ischemic change. 3. Lenticular areas of low density about the left orbit, 1 likely within the left eyelid measuring 18 x 11 mm. This was present on the prior exam measuring approximately 14 by 8 mm. This is well-circumscribed and likely amenable to direct clinical inspection. These may represent sebaceous cysts. 4. Multilevel spinal degenerative changes without acute fracture. Electronically Signed   By: Donzetta Kohut M.D.   On: 11/27/2019 16:33   DG Shoulder Left  Result Date: 11/27/2019 CLINICAL DATA:  Left shoulder pain after fall EXAM: LEFT SHOULDER - 2+ VIEW COMPARISON:  Chest x-ray 04/22/2017 FINDINGS: There is no evidence of fracture or dislocation. Mild degenerative changes. Small mineralized density inferior to the glenoid is unchanged from prior x-ray 04/22/2017, and may reflect a small intra-articular loose body. Soft tissues are unremarkable. Streaky left basilar opacity. IMPRESSION: 1. No acute osseous abnormality. 2. Mild degenerative  changes of the left shoulder. 3. Streaky left basilar opacity. Electronically Signed   By: Duanne Guess D.O.   On: 11/27/2019 15:47    ____________________________________________   PROCEDURES  Procedure(s) performed:   Procedures  None  ____________________________________________   INITIAL IMPRESSION / ASSESSMENT AND PLAN / ED COURSE  Pertinent labs & imaging results that were available during my care of the patient were reviewed by me and considered in my medical decision making (see chart for details).   Patient presents to the ED with 2 falls today and requiring slightly more support with walking.  He is well-appearing with no obvious injury on exam.  Given 2 falls today do plan to obtain some additional labs, EKG, UA in addition to CT head and  C-spine along with plain films of the chest and left shoulder.   CT head and cervical spine reviewed with no acute findings.  Patient's lab work is largely unremarkable and noncontributory.  On reevaluation the patient is very ataxic on ambulation requiring significant assistance to keep from falling.   Discussed patient's case with [facility], Dr. Adrian Blackwater to request admission. Patient and family (if present) updated with plan. Care transferred to [facility] service.  I reviewed all nursing notes, vitals, pertinent old records, EKGs, labs, imaging (as available).  ____________________________________________  FINAL CLINICAL IMPRESSION(S) / ED DIAGNOSES  Final diagnoses:  Gait instability  Fall, initial encounter  Injury of head, initial encounter    Note:  This document was prepared using Dragon voice recognition software and may include unintentional dictation errors.  Alona Bene, MD, [facility] Emergency Medicine    Tressia Labrum, Arlyss Repress, MD 11/27/19 [phone]

## 2019-11-27 NOTE — ED Notes (Signed)
Called for transport of Pt to [facility].  Per [facility], "It may be awhile, for there are a few calls prior".  Nurse informed.

## 2019-11-27 NOTE — ED Notes (Signed)
Report to Priscilla, RN.

## 2019-11-27 NOTE — ED Triage Notes (Signed)
Pt fell on the front porch today, and hit his head on the way down, landing on the left side of his body. Communication is at baseline per care provider. Pt is from [facility]. Hx of dementia.

## 2019-11-27 NOTE — H&P (Signed)
History and Physical  Carvin Almas OEU:[id number] DOB: 07-17-1941 DOA: 11/27/2019  Referring physician: Dr Jacqulyn Bath, ED physician PCP: Wandra Feinstein, MD  Outpatient Specialists: none  Patient Coming From: [facility]  Chief Complaint: fall, ataxia  HPI: Dontarious Schaum is a 79 y.o. male with a history of history of schizophrenia, dementia, type 2 diabetes, hypertension, hyperlipidemia.  Patient currently lives at [facility].  As the patient has dementia and schizophrenia, the patient is unable to give history.  History is obtained by caregiver.  Patient has been having a gradual decline over the past week, but a more sudden decline over the past 12 hours.  Since today, the patient has needed a fair amount assistance in walking as he is unable to balance.  This is a new change.  The patient had 2 falls earlier today: The first in the shower, then on the porch while smoking.  Patient hit his head and left shoulder on the second fall.  He was brought to the hospital for evaluation.  No seeming palliating or provoking factors.  No fevers, chills, nausea, vomiting, decreased appetite, slurred speech, weakness.  Patient does appear to have fairly significant dementia and does not have insight or judgment.  Per the patient's caregiver, the patient has no meaningful relationship and does not have a power of attorney or guardian.  Emergency Department Course: CT of the head and neck are normal.  X-rays are also normal.  Review of Systems:  Patient is unable to provide  Past Medical History:  Diagnosis Date  . Alzheimer disease (HCC)   . BPH (benign prostatic hyperplasia)   . COPD (chronic obstructive pulmonary disease) (HCC)   . Dementia (HCC)   . Diabetes mellitus without complication (HCC)   . Diabetes mellitus, type II (HCC)   . GERD (gastroesophageal reflux disease)   . High cholesterol   . Hyperlipidemia   . Hypertension   . Overactive bladder   . Schizophrenia (HCC)     . Unspecified dementia with behavioral disturbance [facility])    Past Surgical History:  Procedure Laterality Date  . unable     Social History:  reports that he has been smoking cigarettes. He has been smoking about 0.50 packs per day. He has never used smokeless tobacco. He reports that he does not drink alcohol or use drugs. Patient lives at [facility]  No Known Allergies  Family History  Family history unknown: Yes  Unable to provide  Prior to Admission medications   Medication Sig Start Date End Date Taking? Authorizing Provider  acetaminophen (TYLENOL) 325 MG tablet Take 650 mg by mouth every 6 (six) hours as needed for mild pain or moderate pain.   Yes [provider]  carbamazepine (TEGRETOL) 200 MG tablet Take 200 mg by mouth 3 (three) times daily.   Yes [provider]  clonazePAM (KLONOPIN) 0.5 MG tablet Take 0.5 mg by mouth 2 (two) times daily.    Yes [provider]  donepezil (ARICEPT) 10 MG tablet Take 10 mg by mouth at bedtime.   Yes [provider]  folic acid (FOLVITE) 1 MG tablet Take 1 mg by mouth daily.   Yes [provider]  insulin detemir (LEVEMIR) 100 UNIT/ML injection Inject 25 Units into the skin every morning.   Yes [provider]  ketoconazole (NIZORAL) 2 % cream Apply 1 application topically 2 (two) times daily as needed for irritation.   Yes [provider]  latanoprost (XALATAN) 0.005 %  ophthalmic solution Place 1 drop into both eyes at bedtime.   Yes [provider]  memantine (NAMENDA) 10 MG tablet Take 10 mg by mouth 2 (two) times daily.   Yes [provider]  metFORMIN (GLUCOPHAGE) 1000 MG tablet TAKE 1 TABLET BY MOUTH TWICE DAILY. Patient taking differently: Take 1,000 mg by mouth 2 (two) times daily with a meal.  12/22/18  Yes [name], [name], MD  OLANZapine (ZYPREXA) 10 MG tablet Take 10 mg by mouth at bedtime.   Yes [provider]  omeprazole  (PRILOSEC) 20 MG capsule Take 20 mg by mouth daily.   Yes [provider]  oxybutynin (DITROPAN) 5 MG tablet Take 5 mg by mouth 2 (two) times daily.   Yes [provider]  polyethylene glycol powder (GLYCOLAX/MIRALAX) 17 GM/SCOOP powder Take 17 g by mouth daily. Mixed with 8 ounces of water or juice and drink once daily   Yes [provider]  senna-docusate (SENOKOT-S) 8.6-50 MG tablet Take 1 tablet by mouth at bedtime.   Yes [provider]  triamcinolone cream (KENALOG) 0.5 % Apply 1 application topically 2 (two) times daily as needed (applied between toes).   Yes [provider]  bimatoprost (LUMIGAN) 0.01 % SOLN Place 1 drop into both eyes at bedtime.    [provider]  lisinopril (ZESTRIL) 20 MG tablet  09/30/19   [provider]  povidone-iodine (BETADINE) 10 % external solution Apply 1 application topically daily. Apply to skin between the 4th and 5th toes on both feet once daily    [provider]  simvastatin (ZOCOR) 40 MG tablet Take 40 mg by mouth daily.    [provider]  tamsulosin (FLOMAX) 0.4 MG CAPS capsule Take 0.4 mg by mouth daily.    [provider]  Vitamin D, Ergocalciferol, (DRISDOL) 1.25 MG (50000 UT) CAPS capsule TAKE 1 TABLET BY MOUTH ONCE WEEKLY. Patient taking differently: Take 50,000 Units by mouth every 7 (seven) days.  08/26/18   [name], MD    Physical Exam: BP 131/69   Pulse 89   Temp 98.5 F (36.9 C) (Oral)   Resp (!) 25   Ht 5\' 6"  (1.676 m)   Wt 93.9 kg   SpO2 99%   BMI 33.41 kg/m   . General: Elderly male. Awake and alert.  Patient is not oriented. No acute cardiopulmonary distress.  HEENT: Normocephalic atraumatic.  Right and left ears normal in appearance.  Pupils equal, round, reactive to light. Extraocular muscles are intact. Sclerae anicteric and noninjected.  Moist mucosal membranes. No mucosal lesions.  . Neck: Neck supple without  lymphadenopathy. No carotid bruits. No masses palpated.  . Cardiovascular: Regular rate with normal S1-S2 sounds. No murmurs, rubs, gallops auscultated. No JVD.  Marland Kitchen Respiratory: Good respiratory effort with no wheezes, rales, rhonchi. Lungs clear to auscultation bilaterally.  No accessory muscle use. . Abdomen: Soft, nontender, nondistended. Active bowel sounds. No masses or hepatosplenomegaly  . Skin: No rashes, lesions, or ulcerations.  Dry, warm to touch. 2+ dorsalis pedis and radial pulses. . Musculoskeletal: No calf or leg pain. All major joints not erythematous nontender.  No upper or lower joint deformation.  Good ROM.  No contractures  . Psychiatric: Intact judgment and insight. Pleasant and cooperative. . Neurologic: Significant central ataxia. Strength is 5/5 and symmetric in upper and lower extremities.  Cranial nerves II through XII are grossly intact.           Labs on  Admission: I have personally reviewed following labs and imaging studies  CBC: Recent Labs  Lab 11/27/19 1618  WBC 8.0  NEUTROABS 3.6  HGB 11.6*  HCT 37.2*  MCV 111.0*  PLT 253   Basic Metabolic Panel: Recent Labs  Lab 11/27/19 1618  NA 139  K 4.6  CL 103  CO2 28  GLUCOSE 192*  BUN 20  CREATININE 0.88  CALCIUM 8.6*   GFR: Estimated Creatinine Clearance: 76.6 mL/min (by C-G formula based on SCr of 0.88 mg/dL). Liver Function Tests: Recent Labs  Lab 11/27/19 1618  AST 13*  ALT 15  ALKPHOS 79  BILITOT 0.4  PROT 6.1*  ALBUMIN 3.4*   No results for input(s): LIPASE, AMYLASE in the last 168 hours. No results for input(s): AMMONIA in the last 168 hours. Coagulation Profile: No results for input(s): INR, PROTIME in the last 168 hours. Cardiac Enzymes: No results for input(s): CKTOTAL, CKMB, CKMBINDEX, TROPONINI in the last 168 hours. BNP (last 3 results) No results for input(s): PROBNP in the last 8760 hours. HbA1C: No results for input(s): HGBA1C in the last 72 hours. CBG: No results  for input(s): GLUCAP in the last 168 hours. Lipid Profile: No results for input(s): CHOL, HDL, LDLCALC, TRIG, CHOLHDL, LDLDIRECT in the last 72 hours. Thyroid Function Tests: No results for input(s): TSH, T4TOTAL, FREET4, T3FREE, THYROIDAB in the last 72 hours. Anemia Panel: No results for input(s): VITAMINB12, FOLATE, FERRITIN, TIBC, IRON, RETICCTPCT in the last 72 hours. Urine analysis:    Component Value Date/Time   COLORURINE YELLOW 11/27/2019 1517   APPEARANCEUR CLEAR 11/27/2019 1517   LABSPEC 1.021 11/27/2019 1517   PHURINE 5.0 11/27/2019 1517   GLUCOSEU 150 (A) 11/27/2019 1517   HGBUR SMALL (A) 11/27/2019 1517   BILIRUBINUR NEGATIVE 11/27/2019 1517   KETONESUR NEGATIVE 11/27/2019 1517   PROTEINUR NEGATIVE 11/27/2019 1517   UROBILINOGEN 0.2 12/08/2014 1210   NITRITE NEGATIVE 11/27/2019 1517   LEUKOCYTESUR NEGATIVE 11/27/2019 1517   Sepsis Labs: @LABRCNTIP (procalcitonin:4,lacticidven:4) )No results found for this or any previous visit (from the past 240 hour(s)).   Radiological Exams on Admission: DG Chest 2 View  Result Date: 11/27/2019 CLINICAL DATA:  Fall, dementia EXAM: CHEST - 2 VIEW COMPARISON:  04/22/2017 FINDINGS: The heart size and mediastinal contours are within normal limits. Low lung volumes with streaky bibasilar opacities. No pleural effusion or pneumothorax. No acute osseous finding. IMPRESSION: Low lung volumes with streaky bibasilar opacities, likely atelectasis. Electronically Signed   By: Duanne GuessNicholas  Plundo D.O.   On: 11/27/2019 15:45   CT Head Wo Contrast  Result Date: 11/27/2019 CLINICAL DATA:  Head trauma EXAM: CT HEAD WITHOUT CONTRAST CT CERVICAL SPINE WITHOUT CONTRAST TECHNIQUE: Multidetector CT imaging of the head and cervical spine was performed following the standard protocol without intravenous contrast. Multiplanar CT image reconstructions of the cervical spine were also generated. COMPARISON:  None. 06/09/2017 FINDINGS: CT HEAD FINDINGS Brain: No  evidence of acute infarction, hemorrhage, hydrocephalus, extra-axial collection or mass lesion/mass effect. Signs of atrophy and chronic microvascular ischemic change are similar to the prior study. Vascular: No hyperdense vessel or unexpected calcification. Skull: Normal. Negative for fracture or focal lesion. Sinuses/Orbits: Visualized paranasal sinuses and orbits are unremarkable. Other: Lenticular areas of low density about the left orbit, 1 likely within the left eyelid measuring 18 x 11 mm. This was present on the prior exam measuring approximately 14 by 8 mm. This is well-circumscribed and likely amenable to direct clinical inspection. CT CERVICAL SPINE FINDINGS Alignment: Reversal of normal  cervical lordosis in the upper cervical spine in the setting of degenerative change. Skull base and vertebrae: No acute fracture. No primary bone lesion or focal pathologic process. Soft tissues and spinal canal: No prevertebral fluid or swelling. No visible canal hematoma. Disc levels: Multilevel spinal degenerative change is similar to the prior study most pronounced at C3-4, C4-5 and C5-6. Also at C6-7. Uncovertebral spurring is noted at multiple levels along with facet hypertrophy. Facet hypertrophy is greatest on the right at C2-3. Upper chest: Emphysematous changes at the lung apices. Other: None IMPRESSION: 1. No acute intracranial abnormality. 2. Signs of atrophy and chronic microvascular ischemic change. 3. Lenticular areas of low density about the left orbit, 1 likely within the left eyelid measuring 18 x 11 mm. This was present on the prior exam measuring approximately 14 by 8 mm. This is well-circumscribed and likely amenable to direct clinical inspection. These may represent sebaceous cysts. 4. Multilevel spinal degenerative changes without acute fracture. Electronically Signed   By: Zetta Bills M.D.   On: 11/27/2019 16:33   CT Cervical Spine Wo Contrast  Result Date: 11/27/2019 CLINICAL DATA:  Head  trauma EXAM: CT HEAD WITHOUT CONTRAST CT CERVICAL SPINE WITHOUT CONTRAST TECHNIQUE: Multidetector CT imaging of the head and cervical spine was performed following the standard protocol without intravenous contrast. Multiplanar CT image reconstructions of the cervical spine were also generated. COMPARISON:  None. 06/09/2017 FINDINGS: CT HEAD FINDINGS Brain: No evidence of acute infarction, hemorrhage, hydrocephalus, extra-axial collection or mass lesion/mass effect. Signs of atrophy and chronic microvascular ischemic change are similar to the prior study. Vascular: No hyperdense vessel or unexpected calcification. Skull: Normal. Negative for fracture or focal lesion. Sinuses/Orbits: Visualized paranasal sinuses and orbits are unremarkable. Other: Lenticular areas of low density about the left orbit, 1 likely within the left eyelid measuring 18 x 11 mm. This was present on the prior exam measuring approximately 14 by 8 mm. This is well-circumscribed and likely amenable to direct clinical inspection. CT CERVICAL SPINE FINDINGS Alignment: Reversal of normal cervical lordosis in the upper cervical spine in the setting of degenerative change. Skull base and vertebrae: No acute fracture. No primary bone lesion or focal pathologic process. Soft tissues and spinal canal: No prevertebral fluid or swelling. No visible canal hematoma. Disc levels: Multilevel spinal degenerative change is similar to the prior study most pronounced at C3-4, C4-5 and C5-6. Also at C6-7. Uncovertebral spurring is noted at multiple levels along with facet hypertrophy. Facet hypertrophy is greatest on the right at C2-3. Upper chest: Emphysematous changes at the lung apices. Other: None IMPRESSION: 1. No acute intracranial abnormality. 2. Signs of atrophy and chronic microvascular ischemic change. 3. Lenticular areas of low density about the left orbit, 1 likely within the left eyelid measuring 18 x 11 mm. This was present on the prior exam measuring  approximately 14 by 8 mm. This is well-circumscribed and likely amenable to direct clinical inspection. These may represent sebaceous cysts. 4. Multilevel spinal degenerative changes without acute fracture. Electronically Signed   By: Zetta Bills M.D.   On: 11/27/2019 16:33   DG Shoulder Left  Result Date: 11/27/2019 CLINICAL DATA:  Left shoulder pain after fall EXAM: LEFT SHOULDER - 2+ VIEW COMPARISON:  Chest x-ray 04/22/2017 FINDINGS: There is no evidence of fracture or dislocation. Mild degenerative changes. Small mineralized density inferior to the glenoid is unchanged from prior x-ray 04/22/2017, and may reflect a small intra-articular loose body. Soft tissues are unremarkable. Streaky left basilar opacity. IMPRESSION: 1.  No acute osseous abnormality. 2. Mild degenerative changes of the left shoulder. 3. Streaky left basilar opacity. Electronically Signed   By: Duanne Guess D.O.   On: 11/27/2019 15:47    EKG: Pending  Assessment/Plan: Principal Problem:   Ataxia Active Problems:   Controlled type 2 diabetes mellitus with complication, with long-term current use of insulin (HCC)   Schizophrenia (HCC)   Essential hypertension   History of subdural hematoma   Unspecified dementia with behavioral disturbance (HCC)   Hyperlipidemia associated with type 2 diabetes mellitus (HCC)    This patient was discussed with the ED physician, including pertinent vitals, physical exam findings, labs, and imaging.  We also discussed care given by the ED provider.  1. Ataxia a. Observation b. MRI c. PT/OT d. Check vitamin B12, TSH, hemoglobin A1c e. Consult transition of care team -the patient will not be able to return to [facility] without improvement of patient's ataxia as the patient requires too much assistance 2. Dementia a. Patient will need guardian of some sort as the patient has no judgment or insight and is not competent to make decisions for  himself 3. Schizophrenia a. Continue home medications 4. Type 2 diabetes a. Continue home insulin regimen and Metformin 5. History of subdural hematoma a.  6. Hypertension a. Continue antihypertensives  DVT prophylaxis: Lovenox Consultants:  Code Status: Full code presumed Family Communication: None Disposition Plan: Pending   Levie Heritage, DO

## 2019-11-27 NOTE — ED Notes (Signed)
Continues to await [facility] for transport to [facility]

## 2019-11-27 NOTE — ED Notes (Signed)
Awaiting [facility] 

## 2019-11-28 ENCOUNTER — Observation Stay (HOSPITAL_COMMUNITY): Payer: Medicare Other

## 2019-11-28 DIAGNOSIS — F17210 Nicotine dependence, cigarettes, uncomplicated: Secondary | ICD-10-CM | POA: Diagnosis present

## 2019-11-28 DIAGNOSIS — E11649 Type 2 diabetes mellitus with hypoglycemia without coma: Secondary | ICD-10-CM | POA: Diagnosis not present

## 2019-11-28 DIAGNOSIS — Y93E1 Activity, personal bathing and showering: Secondary | ICD-10-CM | POA: Diagnosis not present

## 2019-11-28 DIAGNOSIS — M2550 Pain in unspecified joint: Secondary | ICD-10-CM | POA: Diagnosis not present

## 2019-11-28 DIAGNOSIS — F209 Schizophrenia, unspecified: Secondary | ICD-10-CM | POA: Diagnosis present

## 2019-11-28 DIAGNOSIS — I10 Essential (primary) hypertension: Secondary | ICD-10-CM | POA: Diagnosis present

## 2019-11-28 DIAGNOSIS — S0990XA Unspecified injury of head, initial encounter: Secondary | ICD-10-CM | POA: Diagnosis present

## 2019-11-28 DIAGNOSIS — Z79899 Other long term (current) drug therapy: Secondary | ICD-10-CM | POA: Diagnosis not present

## 2019-11-28 DIAGNOSIS — W182XXA Fall in (into) shower or empty bathtub, initial encounter: Secondary | ICD-10-CM | POA: Diagnosis present

## 2019-11-28 DIAGNOSIS — J449 Chronic obstructive pulmonary disease, unspecified: Secondary | ICD-10-CM | POA: Diagnosis present

## 2019-11-28 DIAGNOSIS — F0281 Dementia in other diseases classified elsewhere with behavioral disturbance: Secondary | ICD-10-CM | POA: Diagnosis present

## 2019-11-28 DIAGNOSIS — Z8679 Personal history of other diseases of the circulatory system: Secondary | ICD-10-CM | POA: Diagnosis not present

## 2019-11-28 DIAGNOSIS — E1169 Type 2 diabetes mellitus with other specified complication: Secondary | ICD-10-CM | POA: Diagnosis present

## 2019-11-28 DIAGNOSIS — R270 Ataxia, unspecified: Secondary | ICD-10-CM | POA: Diagnosis present

## 2019-11-28 DIAGNOSIS — R456 Violent behavior: Secondary | ICD-10-CM | POA: Diagnosis not present

## 2019-11-28 DIAGNOSIS — R630 Anorexia: Secondary | ICD-10-CM | POA: Diagnosis present

## 2019-11-28 DIAGNOSIS — H409 Unspecified glaucoma: Secondary | ICD-10-CM | POA: Diagnosis not present

## 2019-11-28 DIAGNOSIS — W1830XA Fall on same level, unspecified, initial encounter: Secondary | ICD-10-CM | POA: Diagnosis present

## 2019-11-28 DIAGNOSIS — K219 Gastro-esophageal reflux disease without esophagitis: Secondary | ICD-10-CM | POA: Diagnosis present

## 2019-11-28 DIAGNOSIS — E785 Hyperlipidemia, unspecified: Secondary | ICD-10-CM | POA: Diagnosis not present

## 2019-11-28 DIAGNOSIS — Z794 Long term (current) use of insulin: Secondary | ICD-10-CM | POA: Diagnosis not present

## 2019-11-28 DIAGNOSIS — R4182 Altered mental status, unspecified: Secondary | ICD-10-CM | POA: Diagnosis not present

## 2019-11-28 DIAGNOSIS — R296 Repeated falls: Secondary | ICD-10-CM | POA: Diagnosis present

## 2019-11-28 DIAGNOSIS — E872 Acidosis: Secondary | ICD-10-CM | POA: Diagnosis not present

## 2019-11-28 DIAGNOSIS — G319 Degenerative disease of nervous system, unspecified: Secondary | ICD-10-CM | POA: Diagnosis present

## 2019-11-28 DIAGNOSIS — N400 Enlarged prostate without lower urinary tract symptoms: Secondary | ICD-10-CM | POA: Diagnosis present

## 2019-11-28 DIAGNOSIS — R0902 Hypoxemia: Secondary | ICD-10-CM | POA: Diagnosis not present

## 2019-11-28 DIAGNOSIS — R569 Unspecified convulsions: Secondary | ICD-10-CM | POA: Diagnosis not present

## 2019-11-28 DIAGNOSIS — F419 Anxiety disorder, unspecified: Secondary | ICD-10-CM | POA: Diagnosis not present

## 2019-11-28 DIAGNOSIS — B360 Pityriasis versicolor: Secondary | ICD-10-CM | POA: Diagnosis not present

## 2019-11-28 DIAGNOSIS — E1165 Type 2 diabetes mellitus with hyperglycemia: Secondary | ICD-10-CM | POA: Diagnosis not present

## 2019-11-28 DIAGNOSIS — E782 Mixed hyperlipidemia: Secondary | ICD-10-CM | POA: Diagnosis present

## 2019-11-28 DIAGNOSIS — Z7401 Bed confinement status: Secondary | ICD-10-CM | POA: Diagnosis not present

## 2019-11-28 DIAGNOSIS — Z20822 Contact with and (suspected) exposure to covid-19: Secondary | ICD-10-CM | POA: Diagnosis present

## 2019-11-28 DIAGNOSIS — I959 Hypotension, unspecified: Secondary | ICD-10-CM | POA: Diagnosis not present

## 2019-11-28 DIAGNOSIS — E7800 Pure hypercholesterolemia, unspecified: Secondary | ICD-10-CM | POA: Diagnosis present

## 2019-11-28 DIAGNOSIS — F0391 Unspecified dementia with behavioral disturbance: Secondary | ICD-10-CM | POA: Diagnosis not present

## 2019-11-28 DIAGNOSIS — G309 Alzheimer's disease, unspecified: Secondary | ICD-10-CM | POA: Diagnosis present

## 2019-11-28 LAB — BLOOD GAS, ARTERIAL
Acid-Base Excess: 6.8 mmol/L — ABNORMAL HIGH (ref 0.0–2.0)
Bicarbonate: 31.8 mmol/L — ABNORMAL HIGH (ref 20.0–28.0)
FIO2: 36
O2 Saturation: 96.7 %
Patient temperature: 36.6
pCO2 arterial: 53.9 mmHg — ABNORMAL HIGH (ref 32.0–48.0)
pH, Arterial: 7.386 (ref 7.350–7.450)
pO2, Arterial: 89.2 mmHg (ref 83.0–108.0)

## 2019-11-28 LAB — BASIC METABOLIC PANEL
Anion gap: 6 (ref 5–15)
BUN: 14 mg/dL (ref 8–23)
CO2: 30 mmol/L (ref 22–32)
Calcium: 8.9 mg/dL (ref 8.9–10.3)
Chloride: 104 mmol/L (ref 98–111)
Creatinine, Ser: 0.78 mg/dL (ref 0.61–1.24)
GFR calc Af Amer: >60 mL/min (ref >60)
GFR calc non Af Amer: >60 mL/min (ref >60)
Glucose, Bld: 126 mg/dL — ABNORMAL HIGH (ref 70–99)
Potassium: 4.6 mmol/L (ref 3.5–5.1)
Sodium: 140 mmol/L (ref 135–145)

## 2019-11-28 LAB — CBC
HCT: 36.4 % — ABNORMAL LOW (ref 39.0–52.0)
Hemoglobin: 11.5 g/dL — ABNORMAL LOW (ref 13.0–17.0)
MCH: 34.5 pg — ABNORMAL HIGH (ref 26.0–34.0)
MCHC: 31.6 g/dL (ref 30.0–36.0)
MCV: 109.3 fL — ABNORMAL HIGH (ref 80.0–100.0)
Platelets: 225 10*3/uL (ref 150–400)
RBC: 3.33 MIL/uL — ABNORMAL LOW (ref 4.22–5.81)
RDW: 15.1 % (ref 11.5–15.5)
WBC: 5.9 10*3/uL (ref 4.0–10.5)
nRBC: 0 % (ref 0.0–0.2)

## 2019-11-28 LAB — T4, FREE: Free T4: 0.9 ng/dL (ref 0.61–1.12)

## 2019-11-28 LAB — GLUCOSE, CAPILLARY
Glucose-Capillary: 146 mg/dL — ABNORMAL HIGH (ref 70–99)
Glucose-Capillary: 139 mg/dL — ABNORMAL HIGH (ref 70–99)
Glucose-Capillary: 124 mg/dL — ABNORMAL HIGH (ref 70–99)
Glucose-Capillary: 118 mg/dL — ABNORMAL HIGH (ref 70–99)
Glucose-Capillary: 110 mg/dL — ABNORMAL HIGH (ref 70–99)
Glucose-Capillary: 142 mg/dL — ABNORMAL HIGH (ref 70–99)

## 2019-11-28 LAB — AMMONIA: Ammonia: 26 umol/L (ref 9–35)

## 2019-11-28 LAB — SARS CORONAVIRUS 2 (TAT 6-24 HRS): SARS Coronavirus 2: NEGATIVE

## 2019-11-28 LAB — VITAMIN B12: Vitamin B-12: 277 pg/mL (ref 180–914)

## 2019-11-28 LAB — MRSA PCR SCREENING: MRSA by PCR: NEGATIVE

## 2019-11-28 LAB — TSH
TSH: 1.062 u[IU]/mL (ref 0.350–4.500)
TSH: 1.065 u[IU]/mL (ref 0.350–4.500)

## 2019-11-28 MED ORDER — LORAZEPAM 2 MG/ML IJ SOLN
INTRAMUSCULAR | Status: AC
  Administered 2019-11-28: 01:00:00 0.5 mg via INTRAVENOUS
  Filled 2019-11-28: qty 1

## 2019-11-28 MED ORDER — LORAZEPAM 2 MG/ML IJ SOLN: 0.5000 mg | Freq: Once | INTRAMUSCULAR | Status: AC

## 2019-11-28 MED ORDER — THIAMINE HCL 100 MG/ML IJ SOLN
100.0000 mg | Freq: Every day | INTRAMUSCULAR | Status: DC
  Administered 2019-11-28 – 2019-11-29 (×2): 100 mg via INTRAVENOUS
  Filled 2019-11-28 (×2): qty 2

## 2019-11-28 NOTE — Progress Notes (Signed)
PT Cancellation Note  Patient Details Name: Andre Rowe MRN: [id number] DOB: [birth year]   Cancelled Treatment:    Reason Eval/Treat Not Completed: Medical issues which prohibited therapy - note pt lethargic and not appropriate for mobility assessment. Will recheck next date.   Narda Amber [facility] 11/28/2019, 1:16 PM

## 2019-11-28 NOTE — Progress Notes (Signed)
PROGRESS NOTE  Andre Rowe WFU:[id number] DOB: April 29, 1941 DOA: 11/27/2019 PCP: Wandra Feinstein, MD  Brief History   Andre Rowe is a 79 y.o. male with a history of history of schizophrenia, dementia, type 2 diabetes, hypertension, hyperlipidemia.  Patient currently lives at [facility].  As the patient has dementia and schizophrenia, the patient is unable to give history.  History is obtained by caregiver.  Patient has been having a gradual decline over the past week, but a more sudden decline over the past 12 hours.  Since today, the patient has needed a fair amount assistance in walking as he is unable to balance.  This is a new change.  The patient had 2 falls earlier today: The first in the shower, then on the porch while smoking.  Patient hit his head and left shoulder on the second fall.  He was brought to the hospital for evaluation.  No seeming palliating or provoking factors.  No fevers, chills, nausea, vomiting, decreased appetite, slurred speech, weakness.  Patient does appear to have fairly significant dementia and does not have insight or judgment.  Per the patient's caregiver, the patient has no meaningful relationship and does not have a power of attorney or guardian.  This morning was found to be lethargic and nearly unresponsive. A rapid response was called. ABG, CBC, and chemistry were ordered. The patient was found to be hypercarbic and he was saturating 98-100% on 2 L. He was taken off of the 2 liters, and SaO2 goals were adjusted to 88-92% in the computer. I believe that his decreased responsiveness was due to CO2 retention due to high FIO2. I have communicated this to nursing.  Consultants  . None  Procedures  . None  Antibiotics   Anti-infectives (From admission, onward)   None    .   Subjective  The patient is sleeping. He does open eyes to tapping firmly on his forehead. He states that he is "alright" when asked. Then he goes back to sleep.    Objective   Vitals:  Vitals:   11/28/19 1229 11/28/19 1400  BP: 125/71   Pulse: 80   Resp: (!) 30   Temp:    SpO2: 97% 100%   Exam:  Constitutional:  . The patient is sleeping. Difficult to rouse. No acute distress. Marland Kitchen  Respiratory:  . No increased work of breathing. . No wheezes, rales, or rhonchi . No tactile fremitus Cardiovascular:  . Regular rate and rhythm . No murmurs, ectopy, or gallups. . No lateral PMI. No thrills. Abdomen:  . Abdomen is soft, non-tender, non-distended . No hernias, masses, or organomegaly . Normoactive bowel sounds.  Musculoskeletal:  . No cyanosis, clubbing, or edema Skin:  . No rashes, lesions, ulcers . palpation of skin: no induration or nodules Neurologic:  . Unable to evaluate as patient is unable to cooperate with exam. Psychiatric:  Unable to evaluate as patient is unable to cooperate with exam.  I have personally reviewed the following:   Today's Data  . Vitals, ABG, CBC, BMP, Glucoses, TSH, T4, Ammonia  Imaging  . Abdominal film . CXR . Shoulder x-ray  Scheduled Meds: . carbamazepine  200 mg Oral TID  . clonazePAM  0.5 mg Oral BID  . donepezil  10 mg Oral QHS  . enoxaparin (LOVENOX) injection  40 mg Subcutaneous Daily  . insulin aspart  0-15 Units Subcutaneous TID WC  . insulin aspart  0-5 Units Subcutaneous QHS  . insulin detemir  25 Units Subcutaneous  q morning - 10a  . latanoprost  1 drop Both Eyes QHS  . lisinopril  20 mg Oral Daily  . memantine  10 mg Oral BID  . metFORMIN  1,000 mg Oral BID WC  . OLANZapine  10 mg Oral QHS  . oxybutynin  5 mg Oral BID  . simvastatin  40 mg Oral Daily  . tamsulosin  0.4 mg Oral Daily  . thiamine injection  100 mg Intravenous Daily   Continuous Infusions:  Principal Problem:   Ataxia Active Problems:   Controlled type 2 diabetes mellitus with complication, with long-term current use of insulin (HCC)   Schizophrenia (HCC)   Essential hypertension   History of subdural  hematoma   Unspecified dementia with behavioral disturbance (HCC)   Hyperlipidemia associated with type 2 diabetes mellitus (HCC)   Altered mental status hypercarbia Decreased level of responsiveness.  LOS: 0 days   A & P  Decreased level of consciousness: I believe that this is due to mild hypercarbia. The patient is responsive to noxious stimuli and will answer questions before going back to sleep. I have adjusted goal O2 sats to 88-92%. Remainder of work up for decreased level of responsiveness was unremarkable. CT head yesterday did not demonstrate acute abnormality.  Ataxia: MRI, PT/OT are pending. He has been changed to inpatient status due to the complexity of his presentation, his multiple confounding comorbidities, and his decreased level of consciousness this morning. Vitamin B12 and TSH are within normal limits. HbA1c, Thiamine, and folate is pending. The TOC has been consult. This patient will need not be able to return to his previous group home setting as he is requiring too much care.   Dementia: The patient at baseline is not decisional. Patient will need guardian of some sort as the patient has no judgment or insight and is not competent to make decisions for himself.  Schizophrenia: Continue tegretol, Klonopin, Zyprexa  Type 2 diabetes: Continue home insulin regimen. Follow glucoses with FSBS and SSI. Metformin was held.   History of subdural hematoma: Stable appearance on CT head. No acute intracranial abnormality on MRI.  Hypertension: Blood pressure is well controlled on lisinopril.  I have seen and examined this patient myself. I have spent 38 minutes in his evaluation and care.  DVT prophylaxis: Lovenox Consultants:  Code Status: Full code presumed Family Communication: None Disposition Plan: Pt is from a group home which stated that they will not be able to take the patient back. He is not competent to make decisions for himself, so will need a guardian. He will  require long term placement.  Genevieve Arbaugh, DO [facility] Direct contact: see [email or web address]  7PM-7AM contact night coverage as above 11/28/2019, 2:34 PM  LOS: 0 days

## 2019-11-28 NOTE — Progress Notes (Signed)
Patients oxygen saturations noted to drop to low 80's on room air, respiratory rate elevated 35-40. Pt was placed on 4L [place] and weaned to 2L [place]. Respiratory and rapid RN were notified, MD Swayze notified and assessed at bedside. Pt currently sating 98% on 2L [place]. Bladder scan revealed 249CC urine, however noted patient had large episode of urinary incontinence at 0845. CBG 124.

## 2019-11-28 NOTE — Progress Notes (Signed)
OT Cancellation Note  Patient Details Name: Andre Rowe MRN: [id number] DOB: [birth year]   Cancelled Treatment:    Reason Eval/Treat Not Completed: Patient not medically ready;Medical issues which prohibited therapy(Pt required ativan prior to MRI; pt unresponsive intermittently.)  RN hold as pt not medically stable at this time.   Flora Lipps, OTR/L Acute Rehabilitation Services Pager: [phone] Office: [phone]   Margeart Allender C 11/28/2019, 9:07 AM

## 2019-11-28 NOTE — Significant Event (Addendum)
Rapid Response Event Note  Overview: Time Called: 0845 Arrival Time: 0850 Event Type: Other (Comment)(Decreased LOC, tachypnea)  Initial Focused Assessment: Pt lying in bed, minimal response to pain. Pt opens eyes, non-sustained. Lung sounds are clear, no adventitious heart sounds. Pt in no distress. Tachypnea, breaths shallow. PERRLA, 55mm. Pt received 0.5mg  IV Ativan at 0107. Pt has history of COPD. Throughout the night no issues with oxygenation, pt was on continuous telemetry and pulse oximetry monitoring. Gag reflex present and pt wakes up and flails arms with oral care. Eye opening non-sustained. Abdomen is soft, bladder is soft. Per primary RN, pt had large incontinent void this morning.    VS: T 98.108F, BP 131/82 (98), HR 81, RR 29, 98% on room air.  Interventions: -ABG: 7.386/ 53.9/ 89.2/ 31.8 -CBG:   0731: 139, 0913: 124 -Bladder scan: 249 cc  Orders received from Dr. Gerri Lins: -CBC, BMP  Plan of Care (if not transferred): -Frequent neuro checks, monitor pt for improved mentation, wakefulness/alertness   Call rapid response for further needs.  Event Summary: Name of Physician Notified: Dr. Gerri Lins at 0900 Outcome: Stayed in room and stabalized Event End Time: 0910 Provider at bedside to assess pt.   Jennye Moccasin

## 2019-11-29 DIAGNOSIS — R270 Ataxia, unspecified: Secondary | ICD-10-CM | POA: Diagnosis not present

## 2019-11-29 LAB — HEMOGLOBIN A1C
Hgb A1c MFr Bld: 7.6 % — ABNORMAL HIGH (ref 4.8–5.6)
Mean Plasma Glucose: 171 mg/dL

## 2019-11-29 LAB — GLUCOSE, CAPILLARY
Glucose-Capillary: 109 mg/dL — ABNORMAL HIGH (ref 70–99)
Glucose-Capillary: 111 mg/dL — ABNORMAL HIGH (ref 70–99)
Glucose-Capillary: 111 mg/dL — ABNORMAL HIGH (ref 70–99)
Glucose-Capillary: 128 mg/dL — ABNORMAL HIGH (ref 70–99)

## 2019-11-29 LAB — T3, FREE: T3, Free: 2.8 pg/mL (ref 2.0–4.4)

## 2019-11-29 MED ORDER — THIAMINE HCL 100 MG PO TABS
100.0000 mg | ORAL_TABLET | Freq: Every day | ORAL | Status: DC
  Administered 2019-11-30 – 2019-12-21 (×21): 100 mg via ORAL
  Filled 2019-11-29 (×22): qty 1

## 2019-11-29 NOTE — Evaluation (Signed)
Physical Therapy Evaluation Patient Details Name: Andre Rowe MRN: [id number] DOB: [birth year] Today's Date: 11/29/2019   History of Present Illness  Andre Rowe is a 79 y.o. male admitted 11/27/19 s/p x2 falls hitting head and shoulder with generalized weakness. CT C spine- multilvel degenerative changes, no fx. CT head: negative. CT shoulder negative. MRI brain no acute changes.  PMHx: schizophrenia, dementia, T2DM, HTN, HLD. Patient currently lives at [facility] family care  Clinical Impression   Pt admitted with above diagnosis. Patient unable to provide history re: prior functional status. Spoke with staff member at his group home and pt was ambulating without a device with rare falls "if he was going too fast." Staff member was not present for his recent falls PTA and could not provide any insight into how pt fell. Patient is pleasantly confused and did participate with all of therapies requests. Currently he requires +2 moderate assistance to stand and pivot to chiar with strong lean to his left in standing.  Pt currently with functional limitations due to the deficits listed below (see PT Problem List). Pt may benefit from skilled PT to increase their independence and safety with mobility to allow discharge to the venue listed below.       Follow Up Recommendations SNF;Supervision/Assistance - 24 hour(if group home cannot provide min to moderate assist)    Equipment Recommendations  Other (comment)(TBa)    Recommendations for Other Services       Precautions / Restrictions Precautions Precautions: Fall      Mobility  Bed Mobility Overal bed mobility: Needs Assistance Bed Mobility: Rolling;Supine to Sit Rolling: Max assist;+2 for physical assistance   Supine to sit: Max assist;+2 for physical assistance;HOB elevated     General bed mobility comments: pt rolled rt and left for cleaning due to episode of incontinencce of bowels prior to therapy arriving; pt minimally  assists/engages  Transfers Overall transfer level: Needs assistance Equipment used: 2 person hand held assist Transfers: Sit to/from [employer] Sit to Stand: Mod assist;+2 physical assistance;+2 safety/equipment Stand pivot transfers: +2 physical assistance;Mod assist;+2 safety/equipment       General transfer comment: pt required wt-shifting to unweight legs and tactile cues/assist to advance legs during pivot directly bed to chair  Ambulation/Gait             General Gait Details: unable; leans left, legs weak  Stairs            Wheelchair Mobility    Modified Rankin (Stroke Patients Only)       Balance Overall balance assessment: History of Falls;Needs assistance Sitting-balance support: No upper extremity supported;Feet supported Sitting balance-Leahy Scale: Fair Sitting balance - Comments: close supervision for safety due to decr cognition Postural control: Left lateral lean Standing balance support: Bilateral upper extremity supported Standing balance-Leahy Scale: Poor Standing balance comment: left lean in standing                             Pertinent Vitals/Pain Pain Assessment: Faces Faces Pain Scale: No hurt    Home Living Family/patient expects to be discharged to:: Group home                      Prior Function Level of Independence: Needs assistance   Gait / Transfers Assistance Needed: per staff at [facility] family care, pt walks with no device; if he goes too fast, he falls forward (not on a regular basis) This  staff member does not know circumstances of his falls PTA  ADL's / Homemaking Assistance Needed: stood in shower; only needed set up/supervision for bathing dressing; fed himself independently        Hand Dominance        Extremity/Trunk Assessment   Upper Extremity Assessment Upper Extremity Assessment: Defer to OT evaluation    Lower Extremity Assessment Lower Extremity Assessment:  Generalized weakness    Cervical / Trunk Assessment Cervical / Trunk Assessment: Other exceptions Cervical / Trunk Exceptions: obesity  Communication   Communication: Other (comment)(babbles various phrases "Mama Mia" "Ew baby!")  Cognition Arousal/Alertness: Awake/alert Behavior During Therapy: WFL for tasks assessed/performed Overall Cognitive Status: No family/caregiver present to determine baseline cognitive functioning(lives in group home)                                 General Comments: requires verbal and tactile cues to follow instructions; babbles but speech not necessarily related to context      General Comments      Exercises     Assessment/Plan    PT Assessment Patient needs continued PT services  PT Problem List Decreased strength;Decreased balance;Decreased mobility;Decreased coordination;Decreased cognition;Decreased knowledge of use of DME;Decreased safety awareness;Obesity       PT Treatment Interventions DME instruction;Gait training;Functional mobility training;Therapeutic activities;Therapeutic exercise;Balance training;Neuromuscular re-education;Cognitive remediation;Patient/family education    PT Goals (Current goals can be found in the Care Plan section)  Acute Rehab PT Goals Patient Stated Goal: pt unable PT Goal Formulation: Patient unable to participate in goal setting Time For Goal Achievement: 12/13/19 Potential to Achieve Goals: Fair    Frequency Min 2X/week   Barriers to discharge Decreased caregiver support Per chart, Group home cannot provide level of assistance pt needs    Co-evaluation               AM-PAC PT "6 Clicks" Mobility  Outcome Measure Help needed turning from your back to your side while in a flat bed without using bedrails?: A Lot Help needed moving from lying on your back to sitting on the side of a flat bed without using bedrails?: A Lot Help needed moving to and from a bed to a chair (including a  wheelchair)?: A Lot Help needed standing up from a chair using your arms (e.g., wheelchair or bedside chair)?: A Lot Help needed to walk in hospital room?: Total Help needed climbing 3-5 steps with a railing? : Total 6 Click Score: 10    End of Session Equipment Utilized During Treatment: Gait belt Activity Tolerance: Patient tolerated treatment well Patient left: in chair;with call bell/phone within reach;with chair alarm set Nurse Communication: Mobility status PT Visit Diagnosis: Unsteadiness on feet (R26.81);Other symptoms and signs involving the nervous system (R29.898);Difficulty in walking, not elsewhere classified (R26.2);Muscle weakness (generalized) (M62.81)    Time: 3419-3790 PT Time Calculation (min) (ACUTE ONLY): 24 min   Charges:   PT Evaluation $PT Eval Low Complexity: 1 Low           Arby Barrette, PT Pager [phone]   Rexanne Mano 11/29/2019, 12:46 PM

## 2019-11-29 NOTE — Progress Notes (Addendum)
PROGRESS NOTE  Andre Rowe VQM:[id number] DOB: February 17, 1941 DOA: 11/27/2019 PCP: Maryruth Hancock, MD  Brief History   Andre Rowe is a 79 y.o. male with a history of history of schizophrenia, dementia, type 2 diabetes, hypertension, hyperlipidemia.  Patient currently lives at [place].  As the patient has dementia and schizophrenia, the patient is unable to give history.  History is obtained by caregiver.  Patient has been having a gradual decline over the past week, but a more sudden decline over the past 12 hours.  Since today, the patient has needed a fair amount assistance in walking as he is unable to balance.  This is a new change.  The patient had 2 falls earlier today: The first in the shower, then on the porch while smoking.  Patient hit his head and left shoulder on the second fall.  He was brought to the hospital for evaluation.  No seeming palliating or provoking factors.  No fevers, chills, nausea, vomiting, decreased appetite, slurred speech, weakness.  Patient does appear to have fairly significant dementia and does not have insight or judgment.  Per the patient's caregiver, the patient has no meaningful relationship and does not have a power of attorney or guardian.  This morning was found to be lethargic and nearly unresponsive. A rapid response was called. ABG, CBC, and chemistry were ordered. The patient was found to be hypercarbic and he was saturating 98-100% on 2 L. He was taken off of the 2 liters, and SaO2 goals were adjusted to 88-92% in the computer. I believe that his decreased responsiveness was due to CO2 retention due to high FIO2. I have communicated this to nursing.  The patient has been evaluated by PT/OT. They have recommended SNF. The patient will need a guardian.  Consultants  . None  Procedures  . None  Antibiotics   Anti-infectives (From admission, onward)   None      Subjective  The patient is awake and alert. His speech is difficult  to understand, although he does try to communicate. No new complaints.  Objective   Vitals:  Vitals:   11/29/19 1239 11/29/19 1628  BP:  124/89  Pulse: 82 77  Resp: 18 20  Temp:  (!) 97.5 F (36.4 C)  SpO2: 100% 98%   Exam:  Constitutional:  . The patient is working with PT/OT to get up to chair. He is requiring 2 person assist to affect this. No acute distress.  Respiratory:  . No increased work of breathing. . No wheezes, rales, or rhonchi . No tactile fremitus Cardiovascular:  . Regular rate and rhythm . No murmurs, ectopy, or gallups. . No lateral PMI. No thrills. Abdomen:  . Abdomen is soft, non-tender, non-distended . No hernias, masses, or organomegaly . Normoactive bowel sounds.  Musculoskeletal:  . No cyanosis, clubbing, or edema Skin:  . No rashes, lesions, ulcers . palpation of skin: no induration or nodules Neurologic:  . Unable to evaluate as patient is unable to cooperate with exam. Psychiatric:  Unable to evaluate as patient is unable to cooperate with exam.  I have personally reviewed the following:   Today's Data  . Vitals, Hemoglobin A1c is 7.6.   Imaging  . Abdominal film . CXR . Shoulder x-ray  Scheduled Meds: . carbamazepine  200 mg Oral TID  . clonazePAM  0.5 mg Oral BID  . donepezil  10 mg Oral QHS  . enoxaparin (LOVENOX) injection  40 mg Subcutaneous Daily  . insulin aspart  0-15 Units Subcutaneous TID WC  . insulin aspart  0-5 Units Subcutaneous QHS  . insulin detemir  25 Units Subcutaneous q morning - 10a  . latanoprost  1 drop Both Eyes QHS  . lisinopril  20 mg Oral Daily  . memantine  10 mg Oral BID  . metFORMIN  1,000 mg Oral BID WC  . OLANZapine  10 mg Oral QHS  . oxybutynin  5 mg Oral BID  . simvastatin  40 mg Oral Daily  . tamsulosin  0.4 mg Oral Daily  . [START ON 11/30/2019] thiamine  100 mg Oral Daily   Continuous Infusions:  Principal Problem:   Ataxia Active Problems:   Controlled type 2 diabetes mellitus  with complication, with long-term current use of insulin (HCC)   Schizophrenia (HCC)   Essential hypertension   History of subdural hematoma   Unspecified dementia with behavioral disturbance (HCC)   Hyperlipidemia associated with type 2 diabetes mellitus (HCC)   Altered mental status hypercarbia Decreased level of responsiveness.  LOS: 1 day   A & P  Decreased level of consciousness: I believe that this is due to mild hypercarbia. The patient is responsive to noxious stimuli and will answer questions before going back to sleep. I have adjusted goal O2 sats to 88-92%. Remainder of work up for decreased level of responsiveness was unremarkable. CT head yesterday did not demonstrate acute abnormality.  Ataxia: MRI, PT/OT are pending. He has been changed to inpatient status due to the complexity of his presentation, his multiple confounding comorbidities, and his decreased level of consciousness this morning. Vitamin B12 and TSH are within normal limits. HbA1c, Thiamine, and folate is pending. The TOC has been consult. This patient will need not be able to return to his previous group home setting as he is requiring too much care. PT/OT has recommended SNF.  Dementia: The patient at baseline is not decisional. Patient will need guardian of some sort as the patient has no judgment or insight and is not competent to make decisions for himself.  Schizophrenia: Continue tegretol, Klonopin, Zyprexa  Type 2 diabetes: Continue home insulin regimen. Follow glucoses with FSBS and SSI. Metformin was held.   History of subdural hematoma: Stable appearance on CT head. No acute intracranial abnormality on MRI.  Hypertension: Blood pressure is well controlled on lisinopril.  I have seen and examined this patient myself. I have spent 32 minutes in his evaluation and care.  DVT prophylaxis: Lovenox Code Status: Full code presumed Family Communication: None Disposition Plan: Pt is from a group home which  stated that they will not be able to take the patient back. PT/OT has recommended SNF. He is not competent to make decisions for himself, so will need a guardian. He will require long term placement.  Andre Loor, DO [facility] Direct contact: see [email or web address]  7PM-7AM contact night coverage as above 11/28/2019, 4:45 PM  LOS: 0 days

## 2019-11-29 NOTE — Evaluation (Signed)
Occupational Therapy Evaluation Patient Details Name: Andre Rowe MRN: [id number] DOB: 01/07/1941 Today's Date: 11/29/2019    History of Present Illness Andre Rowe is a 79 y.o. male admitted 11/27/19 s/p x2 falls hitting head and shoulder with generalized weakness. CT C spine- multilvel degenerative changes, no fx. CT head: negative. CT shoulder negative. MRI brain no acute changes.  PMHx: schizophrenia, dementia, T2DM, HTN, HLD. Patient currently lives at [facility] family care   Clinical Impression   PT admitted with s/p fall with striking head during fall. Pt currently with functional limitiations due to the deficits listed below (see OT problem list). Pt with tangential speech this session unrelated to task and pursing lips to smack them together to kiss at staff in appreciation for help. Pt is unable to sustain static standing at this time and baseline walks with no DME per family. Pt will need to progress to supervision level to return to group home.  Pt will benefit from skilled OT to increase their independence and safety with adls and balance to allow discharge SNf unless continued to progress toward baseline.     Follow Up Recommendations  SNF    Equipment Recommendations  Wheelchair (measurements OT);Wheelchair cushion (measurements OT);Hospital bed    Recommendations for Other Services       Precautions / Restrictions Precautions Precautions: Fall      Mobility Bed Mobility Overal bed mobility: Needs Assistance Bed Mobility: Rolling;Supine to Sit Rolling: Max assist;+2 for physical assistance   Supine to sit: Max assist;+2 for physical assistance;HOB elevated     General bed mobility comments: pt rolled rt and left for cleaning due to episode of incontinencce of bowels prior to therapy arriving; pt minimally assists/engages. pt with no awareness to incontinence  Transfers Overall transfer level: Needs assistance Equipment used: 2 person hand held  assist Transfers: Sit to/from [employer] Sit to Stand: Mod assist;+2 physical assistance;+2 safety/equipment Stand pivot transfers: +2 physical assistance;Mod assist;+2 safety/equipment       General transfer comment: pt required wt-shifting to unweight legs and tactile cues/assist to advance legs during pivot directly bed to chair    Balance Overall balance assessment: History of Falls;Needs assistance Sitting-balance support: No upper extremity supported;Feet supported Sitting balance-Leahy Scale: Fair Sitting balance - Comments: close supervision for safety due to decr cognition Postural control: Left lateral lean Standing balance support: Bilateral upper extremity supported Standing balance-Leahy Scale: Poor Standing balance comment: left lean in standing                           ADL either performed or assessed with clinical judgement   ADL Overall ADL's : Needs assistance/impaired Eating/Feeding: Minimal assistance;Sitting Eating/Feeding Details (indicate cue type and reason): drinking from a straw coffee Grooming: Wash/dry face;Maximal assistance Grooming Details (indicate cue type and reason): sitting supported Upper Body Bathing: Maximal assistance   Lower Body Bathing: Total assistance   Upper Body Dressing : Maximal assistance   Lower Body Dressing: Total assistance   Toilet Transfer: +2 for physical assistance;Maximal assistance;Stand-pivot             General ADL Comments: pt currently requires total +2 max (A) to stand pivot to chair. pt unable ot sustain static standing     Vision         Perception     Praxis      Pertinent Vitals/Pain Pain Assessment: No/denies pain Faces Pain Scale: No hurt     Hand Dominance Right  Extremity/Trunk Assessment Upper Extremity Assessment Upper Extremity Assessment: Generalized weakness   Lower Extremity Assessment Lower Extremity Assessment: Defer to PT evaluation    Cervical / Trunk Assessment Cervical / Trunk Assessment: Other exceptions Cervical / Trunk Exceptions: obesity   Communication Communication Communication: Other (comment)(babbles various phrases "Mama Mia" "Ew baby!")   Cognition Arousal/Alertness: Awake/alert Behavior During Therapy: WFL for tasks assessed/performed Overall Cognitive Status: No family/caregiver present to determine baseline cognitive functioning(lives in group home)                                 General Comments: requires verbal and tactile cues to follow instructions; babbles but speech not necessarily related to context   General Comments  pt noted to have white hue to skin in gential area due to incontinence and early signs of skin break down. pt with peri hyigene total (A) and dried completely with barrier cream applied    Exercises     Shoulder Instructions      Home Living Family/patient expects to be discharged to:: Group home                                        Prior Functioning/Environment Level of Independence: Needs assistance  Gait / Transfers Assistance Needed: per staff at [facility] family care, pt walks with no device; if he goes too fast, he falls forward (not on a regular basis) This staff member does not know circumstances of his falls PTA ADL's / Homemaking Assistance Needed: stood in shower; only needed set up/supervision for bathing dressing; fed himself independently            OT Problem List: Decreased strength;Impaired balance (sitting and/or standing);Decreased activity tolerance;Decreased safety awareness;Decreased knowledge of use of DME or AE;Decreased knowledge of precautions;Decreased cognition;Decreased coordination      OT Treatment/Interventions: Therapeutic exercise;Self-care/ADL training;Neuromuscular education;Energy conservation;DME and/or AE instruction;Manual therapy;Therapeutic activities;Cognitive  remediation/compensation;Patient/family education;Balance training    OT Goals(Current goals can be found in the care plan section) Acute Rehab OT Goals Patient Stated Goal: pt unable OT Goal Formulation: Patient unable to participate in goal setting Time For Goal Achievement: 12/13/19 Potential to Achieve Goals: Good  OT Frequency: Min 2X/week   Barriers to D/C:            Co-evaluation PT/OT/SLP Co-Evaluation/Treatment: Yes Reason for Co-Treatment: For patient/therapist safety;To address functional/ADL transfers          AM-PAC OT "6 Clicks" Daily Activity     Outcome Measure Help from another person eating meals?: A Lot Help from another person taking care of personal grooming?: Total Help from another person toileting, which includes using toliet, bedpan, or urinal?: Total Help from another person bathing (including washing, rinsing, drying)?: Total Help from another person to put on and taking off regular upper body clothing?: Total Help from another person to put on and taking off regular lower body clothing?: Total 6 Click Score: 7   End of Session Nurse Communication: Mobility status;Precautions  Activity Tolerance: Patient tolerated treatment well Patient left: in chair;with call bell/phone within reach;with chair alarm set;with nursing/sitter in room  OT Visit Diagnosis: Unsteadiness on feet (R26.81);Muscle weakness (generalized) (M62.81)                Time: 1638-4665 OT Time Calculation (min): 32 min Charges:  OT General Charges $OT Visit: 1  Visit OT Evaluation $OT Eval Moderate Complexity: 1 Mod   Brynn, OTR/L  Acute Rehabilitation Services Pager: [phone] Office: [phone] .   Mateo Flow 11/29/2019, 2:27 PM

## 2019-11-30 DIAGNOSIS — R270 Ataxia, unspecified: Secondary | ICD-10-CM | POA: Diagnosis not present

## 2019-11-30 LAB — FOLATE RBC
Folate, Hemolysate: 418 ng/mL
Folate, RBC: 1191 ng/mL (ref >498)
Hematocrit: 35.1 % — ABNORMAL LOW (ref 37.5–51.0)

## 2019-11-30 LAB — GLUCOSE, CAPILLARY
Glucose-Capillary: 84 mg/dL (ref 70–99)
Glucose-Capillary: 93 mg/dL (ref 70–99)
Glucose-Capillary: 81 mg/dL (ref 70–99)
Glucose-Capillary: 85 mg/dL (ref 70–99)

## 2019-11-30 MED ORDER — ACETAMINOPHEN 325 MG PO TABS
650.0000 mg | ORAL_TABLET | Freq: Four times a day (QID) | ORAL | Status: DC | PRN
  Administered 2019-11-30: 13:00:00 650 mg via ORAL
  Filled 2019-11-30: qty 2

## 2019-11-30 MED ORDER — SODIUM CHLORIDE 0.9 % IV BOLUS
500.0000 mL | Freq: Once | INTRAVENOUS | Status: AC
  Administered 2019-11-30: 500 mL via INTRAVENOUS

## 2019-11-30 NOTE — TOC Initial Note (Signed)
Transition of Care [facility]) - Initial/Assessment Note    Patient Details  Name: Andre Rowe MRN: [id number] Date of Birth: 1941/01/24  Transition of Care [facility]) CM/SW Contact:    Baldemar Lenis, LCSW Phone Number: 11/30/2019, 12:35 PM  Clinical Narrative:   CSW contacted Ms. Woodson at [facility] to discuss patient's case. Ms. Lorette Ang indicated that the patient has lived with them since 2007, and they are very familiar with the patient. Patient does not have active family involvement, it has been quite some time since anyone has been by to see him. When he first moved in, a niece and sister-in-law brought him some clothes, but they have not been by in a long time. Ms. Lorette Ang will attempt to locate their phone numbers when she returns to the building. CSW asked about care at the group home, and Ms. Lorette Ang said that they would love to bring him back as he is like family at this point but he needs to be able to walk independently in order for them to take him back; the state had come out to visit on a day when the patient did not feel like walking and they said that the patient was not in an appropriate level of care if he could not ambulate on his own. Ms. Lorette Ang is agreeable to assisting with the patient's decisions at this time, and indicated that rehab would be the best thing for the patient right now. CSW to fax out referral and will follow.               Expected Discharge Plan: Skilled Nursing Facility Barriers to Discharge: Continued Medical Work up   Patient Goals and CMS Choice Patient states their goals for this hospitalization and ongoing recovery are:: patient unable to participate in goal setting at this time CMS Medicare.gov Compare Post Acute Care list provided to:: Patient Represenative (must comment) Choice offered to / list presented to : Clinical research associate of group home)  Expected Discharge Plan and Services Expected Discharge Plan: Skilled Nursing Facility      Post Acute Care Choice: Skilled Nursing Facility Living arrangements for the past 2 months: Group Home                                      Prior Living Arrangements/Services Living arrangements for the past 2 months: Group Home Lives with:: Facility Resident Patient language and need for interpreter reviewed:: No Do you feel safe going back to the place where you live?: Yes      Need for Family Participation in Patient Care: Yes (Comment) Care giver support system in place?: No (comment)   Criminal Activity/Legal Involvement Pertinent to Current Situation/Hospitalization: No - Comment as needed  Activities of Daily Living      Permission Sought/Granted Permission sought to share information with : Facility Medical sales representative, Family Supports Permission granted to share information with : Yes, Verbal Permission Granted  Share Information with NAME: Andre Rowe  Permission granted to share info w AGENCY: SNF  Permission granted to share info w Relationship: Group Home Administrator     Emotional Assessment   Attitude/Demeanor/Rapport: Unable to Assess Affect (typically observed): Unable to Assess Orientation: : Oriented to Self      Admission diagnosis:  Ataxia [R27.0] Gait instability [R26.81] Injury of head, initial encounter [S09.90XA] Fall, initial encounter [W19.XXXA] Altered mental status [R41.82] Patient Active Problem List   Diagnosis Date  Noted  . Altered mental status 11/28/2019  . 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 Hyperlipidemia associated with type 2 diabetes mellitus ([place]) 09/26/2019  . Uncontrolled type 2 diabetes mellitus with hyperglycemia ([place]) 09/24/2019  . Unspecified dementia with behavioral disturbance ([place])   . History of subdural hematoma 10/26/2016  . Subdural hematoma, acute ([place]) 10/25/2016  . Subdural hematoma ([place]) 10/25/2016  . Vitamin D deficiency 12/08/2015  . Mixed hyperlipidemia 08/02/2015  . Cellulitis of leg, left 12/11/2014   . Controlled type 2 diabetes mellitus with complication, with long-term current use of insulin ([place]) 12/08/2014  . Dementia ([place]) 12/08/2014  . Schizophrenia ([place]) 12/08/2014  . Essential hypertension 12/08/2014   PCP:  Maryruth Hancock, MD Pharmacy:   [facility], [place] [address] [place] [address] Phone: [phone] Fax: [phone]     Social Determinants of Health (SDOH) Interventions    Readmission Risk Interventions No flowsheet data found.

## 2019-11-30 NOTE — Progress Notes (Signed)
Name: Andre Rowe DOB: January 31, 2041  Please be advised that the above-named patient will require a short-term nursing home stay -- anticipated 30 days or less for rehabilitation and strengthening. The plan is for return home.

## 2019-11-30 NOTE — NC FL2 (Signed)
[place] MEDICAID FL2 LEVEL OF CARE SCREENING TOOL     IDENTIFICATION  Patient Name: Andre Rowe Birthdate: 02-17-1941 Sex: male Admission Date (Current Location): 11/27/2019  [facility] and [place] Number:  Producer, television/film/video and Address:  [facility] [place], [address] [place], [place] [address]      Provider Number: [id number]  Attending Physician Name and Address:  Fran Lowes, DO  Relative Name and Phone Number:  [name], [phone]    Current Level of Care: Hospital Recommended Level of Care: Skilled Nursing Facility Prior Approval Number:    Date Approved/Denied:   PASRR Number:    Discharge Plan: SNF    Current Diagnoses: Patient Active Problem List   Diagnosis Date Noted  . Altered mental status 11/28/2019  . 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 Hyperlipidemia associated with type 2 diabetes mellitus (HCC) 09/26/2019  . Uncontrolled type 2 diabetes mellitus with hyperglycemia (HCC) 09/24/2019  . Unspecified dementia with behavioral disturbance (HCC)   . History of subdural hematoma 10/26/2016  . Subdural hematoma, acute (HCC) 10/25/2016  . Subdural hematoma (HCC) 10/25/2016  . Vitamin D deficiency 12/08/2015  . Mixed hyperlipidemia 08/02/2015  . Cellulitis of leg, left 12/11/2014  . Controlled type 2 diabetes mellitus with complication, with long-term current use of insulin (HCC) 12/08/2014  . Dementia (HCC) 12/08/2014  . Schizophrenia (HCC) 12/08/2014  . Essential hypertension 12/08/2014    Orientation RESPIRATION BLADDER Height & Weight     Self  Normal External catheter Weight: 207 lb (93.9 kg) Height:  5\' 6"  (167.6 cm)  BEHAVIORAL SYMPTOMS/MOOD NEUROLOGICAL BOWEL NUTRITION STATUS      Incontinent Diet(See discharge summary)  AMBULATORY STATUS COMMUNICATION OF NEEDS Skin   Extensive Assist Verbally Normal                       Personal Care Assistance Level of Assistance  Bathing, Feeding, Dressing Bathing  Assistance: Maximum assistance Feeding assistance: Limited assistance Dressing Assistance: Maximum assistance     Functional Limitations Info  Sight, Hearing, Speech Sight Info: Adequate Hearing Info: Adequate Speech Info: Impaired(Difficulty Speaking)    SPECIAL CARE FACTORS FREQUENCY  PT (By licensed PT), OT (By licensed OT)     PT Frequency: 5x week OT Frequency: 5x week            Contractures Contractures Info: Not present    Additional Factors Info  Code Status, Allergies, Psychotropic, Insulin Sliding Scale Code Status Info: Full Allergies Info: NKA Psychotropic Info: Klonopin, Olanzipine Insulin Sliding Scale Info: Insulin Aspart (Novolog) 0-15 u 3x daily w/ meals, Insulin Aspart (Novolog) 0-5 u @ bedtime, Insulin Detemir (Levemir) 25 u in the morning       Current Medications (11/30/2019):  This is the current hospital active medication list Current Facility-Administered Medications  Medication Dose Route Frequency Provider Last Rate Last Admin  . acetaminophen (TYLENOL) tablet 650 mg  650 mg Oral Q6H PRN Swayze, Ava, DO      . bisacodyl (DULCOLAX) EC tablet 5 mg  5 mg Oral Daily PRN 12/02/2019, DO   5 mg at 11/28/19 2054  . carbamazepine (TEGRETOL) tablet 200 mg  200 mg Oral TID 2055, DO   200 mg at 11/30/19 12/02/19  . clonazePAM (KLONOPIN) tablet 0.5 mg  0.5 mg Oral BID Swayze, Ava, DO   0.5 mg at 11/30/19 0918  . donepezil (ARICEPT) tablet 10 mg  10 mg Oral QHS 12/02/19, DO  10 mg at 11/29/19 2117  . enoxaparin (LOVENOX) injection 40 mg  40 mg Subcutaneous Daily Truett Mainland, DO   40 mg at 11/30/19 4481  . insulin aspart (novoLOG) injection 0-15 Units  0-15 Units Subcutaneous TID WC Truett Mainland, DO   Stopped at 11/28/19 0915  . insulin aspart (novoLOG) injection 0-5 Units  0-5 Units Subcutaneous QHS Stinson, Jacob J, DO      . insulin detemir (LEVEMIR) injection 25 Units  25 Units Subcutaneous q morning - 10a Truett Mainland, DO    25 Units at 11/30/19 0919  . latanoprost (XALATAN) 0.005 % ophthalmic solution 1 drop  1 drop Both Eyes QHS Truett Mainland, DO   1 drop at 11/29/19 2116  . lisinopril (ZESTRIL) tablet 20 mg  20 mg Oral Daily Truett Mainland, DO   20 mg at 11/30/19 8563  . memantine (NAMENDA) tablet 10 mg  10 mg Oral BID Truett Mainland, DO   10 mg at 11/30/19 1497  . metFORMIN (GLUCOPHAGE) tablet 1,000 mg  1,000 mg Oral BID WC Truett Mainland, DO   1,000 mg at 11/30/19 0835  . OLANZapine (ZYPREXA) tablet 10 mg  10 mg Oral QHS Truett Mainland, DO   10 mg at 11/29/19 2117  . ondansetron (ZOFRAN) tablet 4 mg  4 mg Oral Q6H PRN Truett Mainland, DO       Or  . ondansetron [facility]) injection 4 mg  4 mg Intravenous Q6H PRN Truett Mainland, DO      . oxybutynin (DITROPAN) tablet 5 mg  5 mg Oral BID Truett Mainland, DO   5 mg at 11/30/19 0263  . polyethylene glycol (MIRALAX / GLYCOLAX) packet 17 g  17 g Oral Daily PRN Truett Mainland, DO      . simvastatin (ZOCOR) tablet 40 mg  40 mg Oral Daily Truett Mainland, DO   40 mg at 11/30/19 7858  . tamsulosin (FLOMAX) capsule 0.4 mg  0.4 mg Oral Daily Truett Mainland, DO   0.4 mg at 11/30/19 8502  . thiamine tablet 100 mg  100 mg Oral Daily Joselyn Glassman A, RPH   100 mg at 11/30/19 7741     Discharge Medications: Please see discharge summary for a list of discharge medications.  Relevant Imaging Results:  Relevant Lab Results:   Additional Information SS# [place], Student-Social Work

## 2019-11-30 NOTE — Progress Notes (Signed)
PROGRESS NOTE  Andre Rowe DXA:[id number] DOB: 26-Jul-1941 DOA: 11/27/2019 PCP: Wandra Feinstein, MD  Brief History   Andre Rowe is a 79 y.o. male with a history of history of schizophrenia, dementia, type 2 diabetes, hypertension, hyperlipidemia.  Patient currently lives at [facility].  As the patient has dementia and schizophrenia, the patient is unable to give history.  History is obtained by caregiver.  Patient has been having a gradual decline over the past week, but a more sudden decline over the past 12 hours.  Since today, the patient has needed a fair amount assistance in walking as he is unable to balance.  This is a new change.  The patient had 2 falls earlier today: The first in the shower, then on the porch while smoking.  Patient hit his head and left shoulder on the second fall.  He was brought to the hospital for evaluation.  No seeming palliating or provoking factors.  No fevers, chills, nausea, vomiting, decreased appetite, slurred speech, weakness.  Patient does appear to have fairly significant dementia and does not have insight or judgment.  Per the patient's caregiver, the patient has no meaningful relationship and does not have a power of attorney or guardian.  This morning was found to be lethargic and nearly unresponsive. A rapid response was called. ABG, CBC, and chemistry were ordered. The patient was found to be hypercarbic and he was saturating 98-100% on 2 L. He was taken off of the 2 liters, and SaO2 goals were adjusted to 88-92% in the computer. I believe that his decreased responsiveness was due to CO2 retention due to high FIO2. I have communicated this to nursing.  The patient has been evaluated by PT/OT. They have recommended SNF. The patient will need a guardian.  Consultants  . None  Procedures  . None  Antibiotics   Anti-infectives (From admission, onward)   None     Subjective  The patient is resting quietly. No new  complaints.  Objective   Vitals:  Vitals:   11/30/19 0910 11/30/19 1125  BP: 102/67 92/63  Pulse: 99 92  Resp: 20 18  Temp: 99.1 F (37.3 C) 98 F (36.7 C)  SpO2: 93% 95%   Exam:  Constitutional:  . The patient is awake and resting quietly. His speech is difficulty to understand. No acute distress.  Respiratory:  . No increased work of breathing. . No wheezes, rales, or rhonchi . No tactile fremitus Cardiovascular:  . Regular rate and rhythm . No murmurs, ectopy, or gallups. . No lateral PMI. No thrills. Abdomen:  . Abdomen is soft, non-tender, non-distended . No hernias, masses, or organomegaly . Normoactive bowel sounds.  Musculoskeletal:  . No cyanosis, clubbing, or edema Skin:  . No rashes, lesions, ulcers . palpation of skin: no induration or nodules Neurologic:  . Unable to evaluate as patient is unable to cooperate with exam. Psychiatric:  Unable to evaluate as patient is unable to cooperate with exam.  I have personally reviewed the following:   Today's Data  . Vitals, Hemoglobin A1c is 7.6.   Imaging  . Abdominal film . CXR . Shoulder x-ray  Scheduled Meds: . carbamazepine  200 mg Oral TID  . clonazePAM  0.5 mg Oral BID  . donepezil  10 mg Oral QHS  . enoxaparin (LOVENOX) injection  40 mg Subcutaneous Daily  . insulin aspart  0-15 Units Subcutaneous TID WC  . insulin aspart  0-5 Units Subcutaneous QHS  . insulin detemir  25 Units Subcutaneous q morning - 10a  . latanoprost  1 drop Both Eyes QHS  . lisinopril  20 mg Oral Daily  . memantine  10 mg Oral BID  . metFORMIN  1,000 mg Oral BID WC  . OLANZapine  10 mg Oral QHS  . oxybutynin  5 mg Oral BID  . simvastatin  40 mg Oral Daily  . tamsulosin  0.4 mg Oral Daily  . thiamine  100 mg Oral Daily   Continuous Infusions:  Principal Problem:   Ataxia Active Problems:   Controlled type 2 diabetes mellitus with complication, with long-term current use of insulin (HCC)   Schizophrenia (HCC)    Essential hypertension   History of subdural hematoma   Unspecified dementia with behavioral disturbance (HCC)   Hyperlipidemia associated with type 2 diabetes mellitus (HCC)   Altered mental status hypercarbia Decreased level of responsiveness.  LOS: 2 days   A & P  Decreased level of consciousness: I believe that this is due to mild hypercarbia. The patient is responsive to noxious stimuli and will answer questions before going back to sleep. I have adjusted goal O2 sats to 88-92%. Remainder of work up for decreased level of responsiveness was unremarkable. CT head yesterday did not demonstrate acute abnormality.  Ataxia: MRI, PT/OT are pending. He has been changed to inpatient status due to the complexity of his presentation, his multiple confounding comorbidities, and his decreased level of consciousness this morning. Vitamin B12 and TSH are within normal limits. HbA1c, Thiamine, and folate is pending. The TOC has been consult. This patient will need not be able to return to his previous group home setting as he is requiring too much care. PT/OT has recommended SNF.  Dementia: The patient at baseline is not decisional. Patient will need guardian of some sort as the patient has no judgment or insight and is not competent to make decisions for himself.  Schizophrenia: Continue tegretol, Klonopin, Zyprexa  Type 2 diabetes: Continue home insulin regimen. Follow glucoses with FSBS and SSI. Metformin was held.   History of subdural hematoma: Stable appearance on CT head. No acute intracranial abnormality on MRI.  Hypertension: Blood pressure is well controlled on lisinopril.  I have seen and examined this patient myself. I have spent 30 minutes in his evaluation and care.  DVT prophylaxis: Lovenox Code Status: Full code presumed Family Communication: None Disposition Plan: Pt is from a group home which stated that they will not be able to take the patient back. PT/OT has recommended SNF.  He is not competent to make decisions for himself, so will need a guardian. He will require long term placement.  Gawain Crombie, DO [facility] Direct contact: see [email or web address]  7PM-7AM contact night coverage as above 11/30/2019, 2:37 PM  LOS: 0 days

## 2019-12-01 DIAGNOSIS — R270 Ataxia, unspecified: Secondary | ICD-10-CM | POA: Diagnosis not present

## 2019-12-01 LAB — CBC WITH DIFFERENTIAL/PLATELET
Abs Immature Granulocytes: 0.03 10*3/uL (ref 0.00–0.07)
Basophils Absolute: 0 10*3/uL (ref 0.0–0.1)
Basophils Relative: 0 %
Eosinophils Absolute: 0.1 10*3/uL (ref 0.0–0.5)
Eosinophils Relative: 1 %
HCT: 38.1 % — ABNORMAL LOW (ref 39.0–52.0)
Hemoglobin: 12 g/dL — ABNORMAL LOW (ref 13.0–17.0)
Immature Granulocytes: 1 %
Lymphocytes Relative: 33 %
Lymphs Abs: 1.8 10*3/uL (ref 0.7–4.0)
MCH: 34.3 pg — ABNORMAL HIGH (ref 26.0–34.0)
MCHC: 31.5 g/dL (ref 30.0–36.0)
MCV: 108.9 fL — ABNORMAL HIGH (ref 80.0–100.0)
Monocytes Absolute: 0.7 10*3/uL (ref 0.1–1.0)
Monocytes Relative: 12 %
Neutro Abs: 2.9 10*3/uL (ref 1.7–7.7)
Neutrophils Relative %: 53 %
Platelets: 201 10*3/uL (ref 150–400)
RBC: 3.5 MIL/uL — ABNORMAL LOW (ref 4.22–5.81)
RDW: 14.8 % (ref 11.5–15.5)
WBC: 5.5 10*3/uL (ref 4.0–10.5)
nRBC: 0 % (ref 0.0–0.2)

## 2019-12-01 LAB — BASIC METABOLIC PANEL
Anion gap: 8 (ref 5–15)
BUN: 17 mg/dL (ref 8–23)
CO2: 25 mmol/L (ref 22–32)
Calcium: 8.4 mg/dL — ABNORMAL LOW (ref 8.9–10.3)
Chloride: 106 mmol/L (ref 98–111)
Creatinine, Ser: 0.89 mg/dL (ref 0.61–1.24)
GFR calc Af Amer: >60 mL/min (ref >60)
GFR calc non Af Amer: >60 mL/min (ref >60)
Glucose, Bld: 116 mg/dL — ABNORMAL HIGH (ref 70–99)
Potassium: 4.8 mmol/L (ref 3.5–5.1)
Sodium: 139 mmol/L (ref 135–145)

## 2019-12-01 LAB — GLUCOSE, CAPILLARY
Glucose-Capillary: 161 mg/dL — ABNORMAL HIGH (ref 70–99)
Glucose-Capillary: 117 mg/dL — ABNORMAL HIGH (ref 70–99)
Glucose-Capillary: 135 mg/dL — ABNORMAL HIGH (ref 70–99)
Glucose-Capillary: 91 mg/dL (ref 70–99)
Glucose-Capillary: 56 mg/dL — ABNORMAL LOW (ref 70–99)
Glucose-Capillary: 132 mg/dL — ABNORMAL HIGH (ref 70–99)

## 2019-12-01 MED ORDER — INSULIN ASPART 100 UNIT/ML ~~LOC~~ SOLN
0.0000 [IU] | Freq: Three times a day (TID) | SUBCUTANEOUS | Status: DC
  Administered 2019-12-02 – 2019-12-06 (×4): 1 [IU] via SUBCUTANEOUS
  Administered 2019-12-07: 2 [IU] via SUBCUTANEOUS
  Administered 2019-12-08: 1 [IU] via SUBCUTANEOUS
  Administered 2019-12-09: 3 [IU] via SUBCUTANEOUS
  Administered 2019-12-09: 13:00:00 1 [IU] via SUBCUTANEOUS
  Administered 2019-12-10: 5 [IU] via SUBCUTANEOUS
  Administered 2019-12-10: 4 [IU] via SUBCUTANEOUS
  Administered 2019-12-11: 17:00:00 2 [IU] via SUBCUTANEOUS
  Administered 2019-12-11: 13:00:00 1 [IU] via SUBCUTANEOUS
  Administered 2019-12-12 – 2019-12-13 (×3): 2 [IU] via SUBCUTANEOUS
  Administered 2019-12-14: 13:00:00 1 [IU] via SUBCUTANEOUS
  Administered 2019-12-15: 13:00:00 2 [IU] via SUBCUTANEOUS
  Administered 2019-12-15: 18:00:00 1 [IU] via SUBCUTANEOUS
  Administered 2019-12-16 (×2): 2 [IU] via SUBCUTANEOUS
  Administered 2019-12-17: 18:00:00 1 [IU] via SUBCUTANEOUS
  Administered 2019-12-17: 13:00:00 3 [IU] via SUBCUTANEOUS
  Administered 2019-12-18: 2 [IU] via SUBCUTANEOUS
  Administered 2019-12-18: 1 [IU] via SUBCUTANEOUS
  Administered 2019-12-19: 3 [IU] via SUBCUTANEOUS
  Administered 2019-12-20: 4 [IU] via SUBCUTANEOUS
  Administered 2019-12-20: 2 [IU] via SUBCUTANEOUS
  Administered 2019-12-21: 1 [IU] via SUBCUTANEOUS

## 2019-12-01 MED ORDER — DEXTROSE 50 % IV SOLN
INTRAVENOUS | Status: AC
  Administered 2019-12-01: 50 mL
  Filled 2019-12-01: qty 50

## 2019-12-01 NOTE — TOC Progression Note (Signed)
Transition of Care [facility]) - Progression Note    Patient Details  Name: Andre Rowe MRN: [id number] Date of Birth: Dec 02, 1940  [facility]) CM/SW Contact  Terrilee Croak, Student-Social Work Phone Number: 12/01/2019, 1:08 PM  Clinical Narrative:    MSW Intern contacted Ms. Woodson to touch base about the pt's possible family members. She said that she had not had a chance to get the information, but will try to tomorrow. SW will continue to follow.   Expected Discharge Plan: Skilled Nursing Facility Barriers to Discharge: Continued Medical Work up  Expected Discharge Plan and Services Expected Discharge Plan: Skilled Nursing Facility     Post Acute Care Choice: Skilled Nursing Facility Living arrangements for the past 2 months: Group Home                                       Social Determinants of Health (SDOH) Interventions    Readmission Risk Interventions No flowsheet data found.

## 2019-12-01 NOTE — Progress Notes (Signed)
-+  Hypoglycemic Event  CBG: 56  Treatment:IV 25g Dextrose  Symptoms:Learthrgic   Follow-up CBG: Time:2201 CBG Result:132  Possible Reasons for Event: poor oral intake  Comments/MD notified:MD T. Opyd    Karena Addison A [place]

## 2019-12-01 NOTE — Progress Notes (Signed)
PROGRESS NOTE  Andre Rowe FTD:[id number] DOB: 19-Nov-1940 DOA: 11/27/2019 PCP: Wandra Feinstein, MD  Brief History   Andre Rowe is a 79 y.o. male with a history of history of schizophrenia, dementia, type 2 diabetes, hypertension, hyperlipidemia.  Patient currently lives at [facility] [place] [facility].  As the patient has dementia and schizophrenia, the patient is unable to give history.  History is obtained by caregiver.  Patient has been having a gradual decline over the past week, but a more sudden decline over the past 12 hours.  Since today, the patient has needed a fair amount assistance in walking as he is unable to balance.  This is a new change.  The patient had 2 falls earlier today: The first in the shower, then on the porch while smoking.  Patient hit his head and left shoulder on the second fall.  He was brought to the hospital for evaluation.  No seeming palliating or provoking factors.  No fevers, chills, nausea, vomiting, decreased appetite, slurred speech, weakness.  Patient does appear to have fairly significant dementia and does not have insight or judgment.  Per the patient's caregiver, the patient has no meaningful relationship and does not have a power of attorney or guardian.  This morning was found to be lethargic and nearly unresponsive. A rapid response was called. ABG, CBC, and chemistry were ordered. The patient was found to be hypercarbic and he was saturating 98-100% on 2 L. He was taken off of the 2 liters, and SaO2 goals were adjusted to 88-92% in the computer. I believe that his decreased responsiveness was due to CO2 retention due to high FIO2. I have communicated this to nursing.  The patient has been evaluated by PT/OT. They have recommended SNF. The patient will need a guardian.  Consultants  . None  Procedures  . None  Antibiotics   Anti-infectives (From admission, onward)   None     Subjective  The patient is resting quietly. No new  complaints.  Objective   Vitals:  Vitals:   12/01/19 0750 12/01/19 1132  BP: 109/72 90/65  Pulse: 83 93  Resp: (!) 25 (!) 23  Temp: 98.4 F (36.9 C) 98.2 F (36.8 C)  SpO2: 100% 99%   Exam:  Constitutional:  . The patient is awake and resting quietly. His speech is difficulty to understand. No acute distress.  Respiratory:  . No increased work of breathing. . No wheezes, rales, or rhonchi . No tactile fremitus Cardiovascular:  . Regular rate and rhythm . No murmurs, ectopy, or gallups. . No lateral PMI. No thrills. Abdomen:  . Abdomen is soft, non-tender, non-distended . No hernias, masses, or organomegaly . Normoactive bowel sounds.  Musculoskeletal:  . No cyanosis, clubbing, or edema Skin:  . No rashes, lesions, ulcers . palpation of skin: no induration or nodules Neurologic:  . Unable to evaluate as patient is unable to cooperate with exam. Psychiatric:  Unable to evaluate as patient is unable to cooperate with exam.  I have personally reviewed the following:   Today's Data  . Vitals, Hemoglobin A1c is 8.4.   Imaging  . Abdominal film . CXR . Shoulder x-ray  Scheduled Meds: . carbamazepine  200 mg Oral TID  . clonazePAM  0.5 mg Oral BID  . donepezil  10 mg Oral QHS  . enoxaparin (LOVENOX) injection  40 mg Subcutaneous Daily  . insulin aspart  0-15 Units Subcutaneous TID WC  . insulin aspart  0-5 Units Subcutaneous QHS  . insulin  detemir  25 Units Subcutaneous q morning - 10a  . latanoprost  1 drop Both Eyes QHS  . lisinopril  20 mg Oral Daily  . memantine  10 mg Oral BID  . metFORMIN  1,000 mg Oral BID WC  . OLANZapine  10 mg Oral QHS  . oxybutynin  5 mg Oral BID  . simvastatin  40 mg Oral Daily  . tamsulosin  0.4 mg Oral Daily  . thiamine  100 mg Oral Daily   Continuous Infusions:  Principal Problem:   Ataxia Active Problems:   Controlled type 2 diabetes mellitus with complication, with long-term current use of insulin (HCC)    Schizophrenia (HCC)   Essential hypertension   History of subdural hematoma   Unspecified dementia with behavioral disturbance (HCC)   Hyperlipidemia associated with type 2 diabetes mellitus (HCC)   Altered mental status hypercarbia Decreased level of responsiveness.  LOS: 3 days   A & P  Decreased level of consciousness: I believe that this is due to mild hypercarbia. The patient is responsive to noxious stimuli and will answer questions before going back to sleep. I have adjusted goal O2 sats to 88-92%. Remainder of work up for decreased level of responsiveness was unremarkable. CT head yesterday did not demonstrate acute abnormality.  Ataxia: MRI, PT/OT are pending. He has been changed to inpatient status due to the complexity of his presentation, his multiple confounding comorbidities, and his decreased level of consciousness this morning. Vitamin B12 and TSH are within normal limits. HbA1c, Thiamine, and folate is pending. The TOC has been consult. This patient will need not be able to return to his previous group home setting as he is requiring too much care. PT/OT has recommended SNF.  Dementia: The patient at baseline is not decisional. Patient will need guardian of some sort as the patient has no judgment or insight and is not competent to make decisions for himself.  Schizophrenia: Continue tegretol, Klonopin, Zyprexa  Type 2 diabetes: Continue home insulin regimen. Follow glucoses with FSBS and SSI. Metformin was held.   History of subdural hematoma: Stable appearance on CT head. No acute intracranial abnormality on MRI.  Hypertension: Blood pressure is well controlled on lisinopril.  I have seen and examined this patient myself. I have spent 32 minutes in his evaluation and care.  DVT prophylaxis: Lovenox Code Status: Full code presumed Family Communication: None Disposition Plan: Pt is from a group home which stated that they will not be able to take the patient back.  PT/OT has recommended SNF. He is not competent to make decisions for himself, so will need a guardian. He will require long term placement.  Andre Huebert, DO [facility] Direct contact: see [email or web address]  7PM-7AM contact night coverage as above 12/01/2019, 3:15 PM  LOS: 0 days

## 2019-12-02 DIAGNOSIS — R270 Ataxia, unspecified: Secondary | ICD-10-CM | POA: Diagnosis not present

## 2019-12-02 LAB — GLUCOSE, CAPILLARY
Glucose-Capillary: 157 mg/dL — ABNORMAL HIGH (ref 70–99)
Glucose-Capillary: 112 mg/dL — ABNORMAL HIGH (ref 70–99)
Glucose-Capillary: 198 mg/dL — ABNORMAL HIGH (ref 70–99)
Glucose-Capillary: 127 mg/dL — ABNORMAL HIGH (ref 70–99)
Glucose-Capillary: 109 mg/dL — ABNORMAL HIGH (ref 70–99)

## 2019-12-02 MED ORDER — SODIUM CHLORIDE 0.9 % IV BOLUS
500.0000 mL | Freq: Once | INTRAVENOUS | Status: AC
  Administered 2019-12-02: 500 mL via INTRAVENOUS

## 2019-12-02 NOTE — Progress Notes (Signed)
Physical Therapy Treatment Patient Details Name: Andre Rowe MRN: [id number] DOB: 1941-02-25 Today's Date: 12/02/2019    History of Present Illness Eran Mistry is a 79 y.o. male admitted 11/27/19 s/p x2 falls hitting head and shoulder with generalized weakness. CT C spine- multilvel degenerative changes, no fx. CT head: negative. CT shoulder negative. MRI brain no acute changes.  PMHx: schizophrenia, dementia, T2DM, HTN, HLD. Patient currently lives at [facility]    PT Comments    Patient initially difficult to engage, not opening eyes, but responding to therapists. Once sitting EOB pt much more participatory and able to transfer OOB with mod A +2. PT will continue to follow acutely and progress as tolerated.    Follow Up Recommendations  SNF;Supervision/Assistance - 24 hour(if group home cannot provide min to moderate assist)     Equipment Recommendations  Other (comment)(TBa)    Recommendations for Other Services       Precautions / Restrictions Precautions Precautions: Fall Restrictions Weight Bearing Restrictions: No    Mobility  Bed Mobility Overal bed mobility: Needs Assistance Bed Mobility: Rolling;Supine to Sit Rolling: Max assist;+2 for physical assistance   Supine to sit: Max assist;+2 for physical assistance;HOB elevated     General bed mobility comments: Raised HOB in order to arouse and bring to sitting, mod/Max A of 2 due to drowsiness and not opening eyes till at EOB for approximately five minutes  Transfers Overall transfer level: Needs assistance Equipment used: 2 person hand held assist Transfers: Sit to/from [facility] Sit to Stand: Mod assist;+2 physical assistance;+2 safety/equipment Stand pivot transfers: +2 physical assistance;Mod assist;+2 safety/equipment       General transfer comment: Attempted stand pivot x2, first attempt fell immediately back to bed, positioned chair flush with bed, and utilized  increased tactile cues to propel and pivot hips toward chair; second trial pt participating much more and following cues well for sequencing   Ambulation/Gait                 Stairs             Wheelchair Mobility    Modified Rankin (Stroke Patients Only)       Balance Overall balance assessment: History of Falls;Needs assistance Sitting-balance support: No upper extremity supported;Feet supported Sitting balance-Leahy Scale: Fair Sitting balance - Comments: close supervision for safety due to decr cognition Postural control: Left lateral lean Standing balance support: Bilateral upper extremity supported Standing balance-Leahy Scale: Poor Standing balance comment: left lean in standing                            Cognition Arousal/Alertness: Awake/alert Behavior During Therapy: WFL for tasks assessed/performed Overall Cognitive Status: No family/caregiver present to determine baseline cognitive functioning(group home resident)                                 General Comments: requires verbal and tactile cues to follow instructions; was clearer to understand in session (very motivated to watch TV when transferred to chair)      Exercises      General Comments        Pertinent Vitals/Pain Pain Assessment: No/denies pain Faces Pain Scale: No hurt    Home Living                      Prior Function  PT Goals (current goals can now be found in the care plan section) Acute Rehab PT Goals Patient Stated Goal: pt unable Progress towards PT goals: Progressing toward goals    Frequency    Min 2X/week      PT Plan Current plan remains appropriate    Co-evaluation PT/OT/SLP Co-Evaluation/Treatment: Yes Reason for Co-Treatment: Complexity of the patient's impairments (multi-system involvement);Necessary to address cognition/behavior during functional activity;For patient/therapist safety;To address  functional/ADL transfers PT goals addressed during session: Mobility/safety with mobility OT goals addressed during session: ADL's and self-care;Strengthening/ROM      AM-PAC PT "6 Clicks" Mobility   Outcome Measure  Help needed turning from your back to your side while in a flat bed without using bedrails?: A Lot Help needed moving from lying on your back to sitting on the side of a flat bed without using bedrails?: A Lot Help needed moving to and from a bed to a chair (including a wheelchair)?: A Lot Help needed standing up from a chair using your arms (e.g., wheelchair or bedside chair)?: A Lot Help needed to walk in hospital room?: A Lot Help needed climbing 3-5 steps with a railing? : Total 6 Click Score: 11    End of Session Equipment Utilized During Treatment: Gait belt Activity Tolerance: Patient tolerated treatment well Patient left: in chair;with call bell/phone within reach;with chair alarm set Nurse Communication: Mobility status PT Visit Diagnosis: Unsteadiness on feet (R26.81);Other symptoms and signs involving the nervous system (R29.898);Difficulty in walking, not elsewhere classified (R26.2);Muscle weakness (generalized) (M62.81)     Time: 3734-2876 PT Time Calculation (min) (ACUTE ONLY): 26 min  Charges:  $Gait Training: 8-22 mins                     Erline Levine, PTA Acute Rehabilitation Services Pager: [phone] Office: [phone]     Carolynne Edouard 12/02/2019, 5:14 PM

## 2019-12-02 NOTE — Social Work (Cosign Needed)
Name: Andre Rowe DOB: 1941-01-20  Please be advised that the above-named patient has a primary diagnosis of dementia which supersedes any psychiatric diagnosis.

## 2019-12-02 NOTE — Progress Notes (Signed)
Andre Kitchen  PROGRESS NOTE    Ananth Rowe  XKP:[id number] DOB: [birth year] DOA: 11/27/2019 PCP: Wandra Feinstein, MD   Brief Narrative:  Andre Pritchettis a 79 y.o.malewith a history of history of schizophrenia, dementia, type 2 diabetes, hypertension, hyperlipidemia. Patient currently lives at [facility]. As the patient has dementia and schizophrenia, the patient is unable to give history. History is obtained by caregiver. Patient has been having a gradual decline over the past week, but a more sudden decline over the past 12 hours. Since today, the patient has needed a fair amount assistance in walking as he is unable to balance. This is a new change. The patient had 2 falls earlier today: The first in the shower, then on the porch while smoking. Patient hit his head and left shoulder on the second fall. He was brought to the hospital for evaluation. No seeming palliating or provoking factors. No fevers, chills, nausea, vomiting, decreased appetite, slurred speech, weakness.  Patient does appear to have fairly significant dementia and does not have insight or judgment. Per the patient's caregiver, the patient has no meaningful relationship and does not have a power of attorney or guardian.  This morning was found to be lethargic and nearly unresponsive. A rapid response was called. ABG, CBC, and chemistry were ordered. The patient was found to be hypercarbic and he was saturating 98-100% on 2 L. He was taken off of the 2 liters, and SaO2 goals were adjusted to 88-92% in the computer. I believe that his decreased responsiveness was due to CO2 retention due to high FIO2. I have communicated this to nursing.  The patient has been evaluated by PT/OT. They have recommended SNF. The patient will need a guardian.   Assessment & Plan:   Principal Problem:   Ataxia Active Problems:   Controlled type 2 diabetes mellitus with complication, with long-term current use of insulin (HCC)  Schizophrenia (HCC)   Essential hypertension   History of subdural hematoma   Unspecified dementia with behavioral disturbance (HCC)   Hyperlipidemia associated with type 2 diabetes mellitus (HCC)   Altered mental status  Decreased level of consciousness: I believe that this is due to mild hypercarbia. The patient is responsive to noxious stimuli and will answer questions before going back to sleep. I have adjusted goal O2 sats to 88-92%. Remainder of work up for decreased level of responsiveness was unremarkable. CT head yesterday did not demonstrate acute abnormality.  Ataxia: MRI, PT/OT are pending. He has been changed to inpatient status due to the complexity of his presentation, his multiple confounding comorbidities, and his decreased level of consciousness this morning. Vitamin B12 and TSH are within normal limits. HbA1c, Thiamine, and folate is pending. The TOC has been consult. This patient will need not be able to return to his previous group home setting as he is requiring too much care. PT/OT has recommended SNF.  Dementia: The patient at baseline is not decisional. Patient will need guardian of some sort as the patient has no judgment or insight and is not competent to make decisions for himself.  Schizophrenia: Continue tegretol, Klonopin, Zyprexa  Type 2 diabetes: Continue home insulin regimen. Follow glucoses with FSBS and SSI. Metformin was held.   History of subdural hematoma: Stable appearance on CT head. No acute intracranial abnormality on MRI.  Hypertension: Blood pressure is well controlled on lisinopril.   DVT prophylaxis:Lovenox Code Status:Full code presumed Family Communication:None Disposition Plan:Pt is from a group home which stated that they will not  be able to take the patient back. PT/OT has recommended SNF. He is not competent to make decisions for himself, so will need a guardian. He will require long term placement.   Consultants:    Neurology  Procedures:  Antimicrobials: Anti-infectives (From admission, onward)   None      Subjective: Patient was seen and examined at bedside, Patient is resting comfortably,  knows his name but does not know the place and time.  Objective: Vitals:   12/02/19 0416 12/02/19 0721 12/02/19 0821 12/02/19 1213  BP: (!) 80/53 114/75 101/66 103/67  Pulse: 84 82 87   Resp: 20 20 18 20   Temp: 98.7 F (37.1 C) 98.6 F (37 C) 97.9 F (36.6 C)   TempSrc: Oral Axillary Oral   SpO2: 94% 94% 99% 95%  Weight:      Height:        Intake/Output Summary (Last 24 hours) at 12/02/2019 1425 Last data filed at 12/02/2019 1338 Gross per 24 hour  Intake 1295.64 ml  Output 300 ml  Net 995.64 ml   Filed Weights   11/27/19 1443  Weight: 93.9 kg    Examination:  General exam: Appears calm and comfortable  Respiratory system: Clear to auscultation. Respiratory effort normal. Cardiovascular system: S1 & S2 heard, RRR. No JVD, murmurs, rubs, gallops or clicks. No pedal edema. Gastrointestinal system: Abdomen is nondistended, soft and nontender. No organomegaly or masses felt. Normal bowel sounds heard. Central nervous system: Alert and oriented. No focal neurological deficits. Extremities: Symmetric 5 x 5 power. Skin: No rashes, lesions or ulcers Psychiatry: Judgement and insight appear normal. Mood & affect appropriate.     Data Reviewed: I have personally reviewed following labs and imaging studies  CBC: Recent Labs  Lab 11/27/19 1618 11/28/19 0950 11/28/19 1059 12/01/19 0611  WBC 8.0 5.9  --  5.5  NEUTROABS 3.6  --   --  2.9  HGB 11.6* 11.5*  --  12.0*  HCT 37.2* 36.4* 35.1* 38.1*  MCV 111.0* 109.3*  --  108.9*  PLT 253 225  --  924   Basic Metabolic Panel: Recent Labs  Lab 11/27/19 1618 11/28/19 0950 12/01/19 0611  NA 139 140 139  K 4.6 4.6 4.8  CL 103 104 106  CO2 28 30 25   GLUCOSE 192* 126* 116*  BUN 20 14 17   CREATININE 0.88 0.78 0.89  CALCIUM 8.6* 8.9  8.4*   GFR: Estimated Creatinine Clearance: 75.7 mL/min (by C-G formula based on SCr of 0.89 mg/dL). Liver Function Tests: Recent Labs  Lab 11/27/19 1618  AST 13*  ALT 15  ALKPHOS 79  BILITOT 0.4  PROT 6.1*  ALBUMIN 3.4*   No results for input(s): LIPASE, AMYLASE in the last 168 hours. Recent Labs  Lab 11/28/19 1059  AMMONIA 26   Coagulation Profile: No results for input(s): INR, PROTIME in the last 168 hours. Cardiac Enzymes: No results for input(s): CKTOTAL, CKMB, CKMBINDEX, TROPONINI in the last 168 hours. BNP (last 3 results) No results for input(s): PROBNP in the last 8760 hours. HbA1C: No results for input(s): HGBA1C in the last 72 hours. CBG: Recent Labs  Lab 12/01/19 2121 12/01/19 2201 12/02/19 0452 12/02/19 0645 12/02/19 1039  GLUCAP 56* 132* 157* 112* 198*   Lipid Profile: No results for input(s): CHOL, HDL, LDLCALC, TRIG, CHOLHDL, LDLDIRECT in the last 72 hours. Thyroid Function Tests: No results for input(s): TSH, T4TOTAL, FREET4, T3FREE, THYROIDAB in the last 72 hours. Anemia Panel: No results for input(s): VITAMINB12, FOLATE,  FERRITIN, TIBC, IRON, RETICCTPCT in the last 72 hours. Sepsis Labs: No results for input(s): PROCALCITON, LATICACIDVEN in the last 168 hours.  Recent Results (from the past 240 hour(s))  SARS CORONAVIRUS 2 (TAT 6-24 HRS) Nasopharyngeal Nasopharyngeal Swab     Status: None   Collection Time: 11/27/19  5:18 PM   Specimen: Nasopharyngeal Swab  Result Value Ref Range Status   SARS Coronavirus 2 NEGATIVE NEGATIVE Final    Comment: (NOTE) SARS-CoV-2 target nucleic acids are NOT DETECTED. The SARS-CoV-2 RNA is generally detectable in upper and lower respiratory specimens during the acute phase of infection. Negative results do not preclude SARS-CoV-2 infection, do not rule out co-infections with other pathogens, and should not be used as the sole basis for treatment or other patient management decisions. Negative results must  be combined with clinical observations, patient history, and epidemiological information. The expected result is Negative. Fact Sheet for Patients: HairSlick.no Fact Sheet for Healthcare Providers: quierodirigir.com This test is not yet approved or cleared by the [place] FDA and  has been authorized for detection and/or diagnosis of SARS-CoV-2 by FDA under an Emergency Use Authorization (EUA). This EUA will remain  in effect (meaning this test can be used) for the duration of the COVID-19 declaration under Section 56 4(b)(1) of the Act, 21 U.S.C. section 360bbb-3(b)(1), unless the authorization is terminated or revoked sooner. Performed at [facility], [address] [place], [place] [address]   MRSA PCR Screening     Status: None   Collection Time: 11/28/19  7:48 PM   Specimen: Nasal Mucosa; Nasopharyngeal  Result Value Ref Range Status   MRSA by PCR NEGATIVE NEGATIVE Final    Comment:        The GeneXpert MRSA Assay (FDA approved for NASAL specimens only), is one component of a comprehensive MRSA colonization surveillance program. It is not intended to diagnose MRSA infection nor to guide or monitor treatment for MRSA infections. Performed at [facility], [address] [place], [place] [address]          Radiology Studies: No results found.      Scheduled Meds: . carbamazepine  200 mg Oral TID  . clonazePAM  0.5 mg Oral BID  . donepezil  10 mg Oral QHS  . enoxaparin (LOVENOX) injection  40 mg Subcutaneous Daily  . insulin aspart  0-6 Units Subcutaneous TID WC  . insulin detemir  25 Units Subcutaneous q morning - 10a  . latanoprost  1 drop Both Eyes QHS  . lisinopril  20 mg Oral Daily  . memantine  10 mg Oral BID  . metFORMIN  1,000 mg Oral BID WC  . OLANZapine  10 mg Oral QHS  . oxybutynin  5 mg Oral BID  . simvastatin  40 mg Oral Daily  . tamsulosin  0.4 mg Oral Daily  .  thiamine  100 mg Oral Daily   Continuous Infusions:   LOS: 4 days    Time spent: 25 mins.    Cipriano Bunker, MD [facility]   If 7PM-7AM, please contact night-coverage

## 2019-12-02 NOTE — Progress Notes (Signed)
Occupational Therapy Treatment Patient Details Name: Andre Rowe MRN: [id number] DOB: 04/28/1941 Today's Date: 12/02/2019    History of present illness Andre Rowe is a 79 y.o. male admitted 11/27/19 s/p x2 falls hitting head and shoulder with generalized weakness. CT C spine- multilvel degenerative changes, no fx. CT head: negative. CT shoulder negative. MRI brain no acute changes.  PMHx: schizophrenia, dementia, T2DM, HTN, HLD. Patient currently lives at [facility]   OT comments  Patient continues to make steady progress towards goals in skilled OT session. Patient's session encompassed cotreat with PT in order to safely address functional mobility. Pt demonstrated increased difficulty to arouse in session, having to elevate HOB and sit EOB with max x2 in order to open eyes (pt would nod in agreement during movement but required multi-modal cues). Despite sitting EOB, pt required an additional 5 minutes with max verbal and tactile cues in order have eyes remain open. Once engaged, pt able to participate in stand pivot transfers with mod/max hand held assist of two, set up for lunch at end of session. Will continue to follow acutely in order to progress and address deficits.    Follow Up Recommendations  SNF    Equipment Recommendations  Wheelchair (measurements OT);Wheelchair cushion (measurements OT);Hospital bed    Recommendations for Other Services      Precautions / Restrictions Precautions Precautions: Fall Restrictions Weight Bearing Restrictions: No       Mobility Bed Mobility Overal bed mobility: Needs Assistance       Supine to sit: Max assist;+2 for physical assistance;HOB elevated     General bed mobility comments: Raised HOB in order to arouse and bring to sitting, mod/Max A of 2 due to drowsiness and not opening eyes till at EOB for approximately five minutes  Transfers Overall transfer level: Needs assistance Equipment used: 2 person hand held  assist Transfers: Sit to/from [employer] Sit to Stand: Mod assist;+2 physical assistance;+2 safety/equipment Stand pivot transfers: +2 physical assistance;Mod assist;+2 safety/equipment       General transfer comment: Attempted stand pivot x2, first attempt fell immediately back to bed, positioned chair flush with bed, and utilized increased tactile cues to propel and pivot hips toward chair    Balance Overall balance assessment: History of Falls;Needs assistance Sitting-balance support: No upper extremity supported;Feet supported Sitting balance-Leahy Scale: Fair Sitting balance - Comments: close supervision for safety due to decr cognition Postural control: Left lateral lean Standing balance support: Bilateral upper extremity supported Standing balance-Leahy Scale: Poor Standing balance comment: left lean in standing                           ADL either performed or assessed with clinical judgement   ADL Overall ADL's : Needs assistance/impaired Eating/Feeding: Sitting;Set up                       Toilet Transfer: +2 for physical assistance;Maximal assistance;Stand-pivot;Moderate assistance Toilet Transfer Details (indicate cue type and reason): Simulated via recliner         Functional mobility during ADLs: +2 for physical assistance;Maximal assistance;Moderate assistance General ADL Comments: pt mod/max +2 to stand pivot, noted to be more lethargic in session, requiring increased number of minutes to arouse, pt standed with mod A +2, then immediately sat back to EOB, then able to successfully transfer upon second attempt     Vision       Perception     Praxis  Cognition Arousal/Alertness: Awake/alert Behavior During Therapy: WFL for tasks assessed/performed Overall Cognitive Status: No family/caregiver present to determine baseline cognitive functioning(group home resident)                                  General Comments: requires verbal and tactile cues to follow instructions; was clearer to understand in session (very motivated to watch TV when transferred to chair)        Exercises     Shoulder Instructions       General Comments      Pertinent Vitals/ Pain       Pain Assessment: No/denies pain Faces Pain Scale: No hurt  Home Living                                          Prior Functioning/Environment              Frequency  Min 2X/week        Progress Toward Goals  OT Goals(current goals can now be found in the care plan section)  Progress towards OT goals: Progressing toward goals  Acute Rehab OT Goals Patient Stated Goal: pt unable OT Goal Formulation: Patient unable to participate in goal setting Time For Goal Achievement: 12/13/19 Potential to Achieve Goals: Good  Plan Discharge plan remains appropriate    Co-evaluation    PT/OT/SLP Co-Evaluation/Treatment: Yes Reason for Co-Treatment: Necessary to address cognition/behavior during functional activity;For patient/therapist safety;Complexity of the patient's impairments (multi-system involvement);To address functional/ADL transfers   OT goals addressed during session: ADL's and self-care;Strengthening/ROM      AM-PAC OT "6 Clicks" Daily Activity     Outcome Measure   Help from another person eating meals?: A Little Help from another person taking care of personal grooming?: A Lot Help from another person toileting, which includes using toliet, bedpan, or urinal?: A Lot Help from another person bathing (including washing, rinsing, drying)?: A Lot Help from another person to put on and taking off regular upper body clothing?: A Lot Help from another person to put on and taking off regular lower body clothing?: A Lot 6 Click Score: 13    End of Session    OT Visit Diagnosis: Unsteadiness on feet (R26.81);Muscle weakness (generalized) (M62.81)   Activity Tolerance Patient  limited by lethargy   Patient Left in chair;with call bell/phone within reach;with chair alarm set   Nurse Communication Mobility status;Precautions        Time: 2683-4196 OT Time Calculation (min): 27 min  Charges: OT General Charges $OT Visit: 1 Visit OT Treatments $Self Care/Home Management : 8-22 mins  Corinne Ports E. Latitia Housewright, COTA/L Acute Rehabilitation Services [place] 12/02/2019, 2:58 PM

## 2019-12-02 NOTE — TOC Progression Note (Signed)
Transition of Care [facility]) - Progression Note    Patient Details  Name: Argil Mahl MRN: [id number] Date of Birth: [birth year]  [facility] CM/SW Contact  Baldemar Lenis, [place] Phone Number: 12/02/2019, 1:22 PM  Clinical Narrative:   CSW received contact information for patient's nephew, Satoru Milich. CSW called him and left voicemail, received a call back from him that he would be the next of kin for the patient. Everlean Alstrom is willing to assist in making decisions for the patient. CSW explained need for rehab, and Everlean Alstrom is in agreement. CSW emailed Everlean Alstrom CMS list to review, and faxed out patient's referral. CSW to follow.    Expected Discharge Plan: Skilled Nursing Facility Barriers to Discharge: Continued Medical Work up  Expected Discharge Plan and Services Expected Discharge Plan: Skilled Nursing Facility     Post Acute Care Choice: Skilled Nursing Facility Living arrangements for the past 2 months: Group Home                                       Social Determinants of Health (SDOH) Interventions    Readmission Risk Interventions No flowsheet data found.

## 2019-12-03 DIAGNOSIS — R270 Ataxia, unspecified: Secondary | ICD-10-CM | POA: Diagnosis not present

## 2019-12-03 LAB — CBC
HCT: 37.4 % — ABNORMAL LOW (ref 39.0–52.0)
Hemoglobin: 11.9 g/dL — ABNORMAL LOW (ref 13.0–17.0)
MCH: 34.7 pg — ABNORMAL HIGH (ref 26.0–34.0)
MCHC: 31.8 g/dL (ref 30.0–36.0)
MCV: 109 fL — ABNORMAL HIGH (ref 80.0–100.0)
Platelets: 207 10*3/uL (ref 150–400)
RBC: 3.43 MIL/uL — ABNORMAL LOW (ref 4.22–5.81)
RDW: 14.9 % (ref 11.5–15.5)
WBC: 5.5 10*3/uL (ref 4.0–10.5)
nRBC: 0 % (ref 0.0–0.2)

## 2019-12-03 LAB — BASIC METABOLIC PANEL
Anion gap: 8 (ref 5–15)
BUN: 23 mg/dL (ref 8–23)
CO2: 28 mmol/L (ref 22–32)
Calcium: 8.7 mg/dL — ABNORMAL LOW (ref 8.9–10.3)
Chloride: 105 mmol/L (ref 98–111)
Creatinine, Ser: 0.87 mg/dL (ref 0.61–1.24)
GFR calc Af Amer: >60 mL/min (ref >60)
GFR calc non Af Amer: >60 mL/min (ref >60)
Glucose, Bld: 92 mg/dL (ref 70–99)
Potassium: 4.4 mmol/L (ref 3.5–5.1)
Sodium: 141 mmol/L (ref 135–145)

## 2019-12-03 LAB — GLUCOSE, CAPILLARY
Glucose-Capillary: 106 mg/dL — ABNORMAL HIGH (ref 70–99)
Glucose-Capillary: 114 mg/dL — ABNORMAL HIGH (ref 70–99)
Glucose-Capillary: 162 mg/dL — ABNORMAL HIGH (ref 70–99)
Glucose-Capillary: 129 mg/dL — ABNORMAL HIGH (ref 70–99)
Glucose-Capillary: 114 mg/dL — ABNORMAL HIGH (ref 70–99)

## 2019-12-03 NOTE — Progress Notes (Signed)
Marland Kitchen  PROGRESS NOTE    Quintyn Dombek  TMA:[id number] DOB: [birth year] DOA: 11/27/2019 PCP: Wandra Feinstein, MD   Brief Narrative:  Andre Pritchettis a 79 y.o.malewith a history of history of schizophrenia, dementia, type 2 diabetes, hypertension, hyperlipidemia. Patient currently lives at [facility]. As the patient has dementia and schizophrenia, the patient is unable to give history. History is obtained by caregiver. Patient has been having a gradual decline over the past week, but a more sudden decline over the past 12 hours. Since today, the patient has needed a fair amount assistance in walking as he is unable to balance. This is a new change. The patient had 2 falls earlier today: The first in the shower, then on the porch while smoking. Patient hit his head and left shoulder on the second fall. He was brought to the hospital for evaluation. No seeming palliating or provoking factors. No fevers, chills, nausea, vomiting, decreased appetite, slurred speech, weakness.  Patient does appear to have fairly significant dementia and does not have insight or judgment. Per the patient's caregiver, the patient has no meaningful relationship and does not have a power of attorney or guardian.  This morning was found to be lethargic and nearly unresponsive. A rapid response was called. ABG, CBC, and chemistry were ordered. The patient was found to be hypercarbic and he was saturating 98-100% on 2 L. He was taken off of the 2 liters, and SaO2 goals were adjusted to 88-92% in the computer. I believe that his decreased responsiveness was due to CO2 retention due to high FIO2. I have communicated this to nursing.  The patient has been evaluated by PT/OT. They have recommended SNF. The patient will need a guardian.   Assessment & Plan:   Principal Problem:   Ataxia Active Problems:   Controlled type 2 diabetes mellitus with complication, with long-term current use of insulin (HCC)  Schizophrenia (HCC)   Essential hypertension   History of subdural hematoma   Unspecified dementia with behavioral disturbance (HCC)   Hyperlipidemia associated with type 2 diabetes mellitus (HCC)   Altered mental status  Decreased level of consciousness: I believe that this is due to mild hypercarbia. The patient is responsive to noxious stimuli and will answer questions before going back to sleep. I have adjusted goal O2 sats to 88-92%. Remainder of work up for decreased level of responsiveness was unremarkable. CT head yesterday did not demonstrate acute abnormality.  Ataxia: MRI, PT/OT are pending. He has been changed to inpatient status due to the complexity of his presentation, his multiple confounding comorbidities, and his decreased level of consciousness this morning. Vitamin B12 and TSH are within normal limits. HbA1c, Thiamine, and folate is pending. The TOC has been consult. This patient will need not be able to return to his previous group home setting as he is requiring too much care. PT/OT has recommended SNF.  Dementia: The patient at baseline is not decisional. Patient will need guardian of some sort as the patient has no judgment or insight and is not competent to make decisions for himself.  Schizophrenia: Continue tegretol, Klonopin, Zyprexa  Type 2 diabetes: Continue home insulin regimen. Follow glucoses with FSBS and SSI. Metformin was held.   History of subdural hematoma: Stable appearance on CT head. No acute intracranial abnormality on MRI.  Hypertension: Blood pressure is well controlled on lisinopril.   DVT prophylaxis:Lovenox Code Status:Full code presumed Family Communication:None Disposition Plan:Pt is from a group home which stated that they will not  be able to take the patient back. PT/OT has recommended SNF. He is not competent to make decisions for himself, so will need a guardian. He will require long term placement.   Consultants:    Neurology  Procedures:  Antimicrobials: Anti-infectives (From admission, onward)   None      Subjective: Patient was seen and examined at bedside, Patient is resting comfortably,  knows his name but does not know the place and time.  Objective: Vitals:   12/03/19 0027 12/03/19 0455 12/03/19 0809 12/03/19 1119  BP: 115/77 114/72 122/75 107/61  Pulse: 72 75 76 100  Resp: 19 20 15 18   Temp: 97.7 F (36.5 C) 97.9 F (36.6 C) 98 F (36.7 C) 98 F (36.7 C)  TempSrc: Oral Oral Oral Oral  SpO2: 100% 100% 100% 100%  Weight:      Height:        Intake/Output Summary (Last 24 hours) at 12/03/2019 1336 Last data filed at 12/03/2019 0419 Gross per 24 hour  Intake 680 ml  Output 1000 ml  Net -320 ml   Filed Weights   11/27/19 1443  Weight: 93.9 kg    Examination:  General exam: Appears calm and comfortable  Respiratory system: Clear to auscultation. Respiratory effort normal. Cardiovascular system: S1 & S2 heard, RRR. No JVD, murmurs, rubs, gallops or clicks. No pedal edema. Gastrointestinal system: Abdomen is nondistended, soft and nontender. No organomegaly or masses felt. Normal bowel sounds heard. Central nervous system: Alert and oriented. No focal neurological deficits. Extremities: Not assessed Skin: No rashes, lesions or ulcers Psychiatry: Judgement and insight appear normal. Mood & affect appropriate.     Data Reviewed: I have personally reviewed following labs and imaging studies  CBC: Recent Labs  Lab 11/27/19 1618 11/28/19 0950 11/28/19 1059 12/01/19 0611 12/03/19 0448  WBC 8.0 5.9  --  5.5 5.5  NEUTROABS 3.6  --   --  2.9  --   HGB 11.6* 11.5*  --  12.0* 11.9*  HCT 37.2* 36.4* 35.1* 38.1* 37.4*  MCV 111.0* 109.3*  --  108.9* 109.0*  PLT 253 225  --  201 161   Basic Metabolic Panel: Recent Labs  Lab 11/27/19 1618 11/28/19 0950 12/01/19 0611 12/03/19 0448  NA 139 140 139 141  K 4.6 4.6 4.8 4.4  CL 103 104 106 105  CO2 28 30 25 28    GLUCOSE 192* 126* 116* 92  BUN 20 14 17 23   CREATININE 0.88 0.78 0.89 0.87  CALCIUM 8.6* 8.9 8.4* 8.7*   GFR: Estimated Creatinine Clearance: 77.4 mL/min (by C-G formula based on SCr of 0.87 mg/dL). Liver Function Tests: Recent Labs  Lab 11/27/19 1618  AST 13*  ALT 15  ALKPHOS 79  BILITOT 0.4  PROT 6.1*  ALBUMIN 3.4*   No results for input(s): LIPASE, AMYLASE in the last 168 hours. Recent Labs  Lab 11/28/19 1059  AMMONIA 26   Coagulation Profile: No results for input(s): INR, PROTIME in the last 168 hours. Cardiac Enzymes: No results for input(s): CKTOTAL, CKMB, CKMBINDEX, TROPONINI in the last 168 hours. BNP (last 3 results) No results for input(s): PROBNP in the last 8760 hours. HbA1C: No results for input(s): HGBA1C in the last 72 hours. CBG: Recent Labs  Lab 12/02/19 1620 12/02/19 2232 12/03/19 0609 12/03/19 0900 12/03/19 1120  GLUCAP 127* 109* 106* 114* 162*   Lipid Profile: No results for input(s): CHOL, HDL, LDLCALC, TRIG, CHOLHDL, LDLDIRECT in the last 72 hours. Thyroid Function Tests: No results for  input(s): TSH, T4TOTAL, FREET4, T3FREE, THYROIDAB in the last 72 hours. Anemia Panel: No results for input(s): VITAMINB12, FOLATE, FERRITIN, TIBC, IRON, RETICCTPCT in the last 72 hours. Sepsis Labs: No results for input(s): PROCALCITON, LATICACIDVEN in the last 168 hours.  Recent Results (from the past 240 hour(s))  SARS CORONAVIRUS 2 (TAT 6-24 HRS) Nasopharyngeal Nasopharyngeal Swab     Status: None   Collection Time: 11/27/19  5:18 PM   Specimen: Nasopharyngeal Swab  Result Value Ref Range Status   SARS Coronavirus 2 NEGATIVE NEGATIVE Final    Comment: (NOTE) SARS-CoV-2 target nucleic acids are NOT DETECTED. The SARS-CoV-2 RNA is generally detectable in upper and lower respiratory specimens during the acute phase of infection. Negative results do not preclude SARS-CoV-2 infection, do not rule out co-infections with other pathogens, and should not  be used as the sole basis for treatment or other patient management decisions. Negative results must be combined with clinical observations, patient history, and epidemiological information. The expected result is Negative. Fact Sheet for Patients: HairSlick.no Fact Sheet for Healthcare Providers: quierodirigir.com This test is not yet approved or cleared by the [place] FDA and  has been authorized for detection and/or diagnosis of SARS-CoV-2 by FDA under an Emergency Use Authorization (EUA). This EUA will remain  in effect (meaning this test can be used) for the duration of the COVID-19 declaration under Section 56 4(b)(1) of the Act, 21 U.S.C. section 360bbb-3(b)(1), unless the authorization is terminated or revoked sooner. Performed at [facility], [address] [place], [place] [address]   MRSA PCR Screening     Status: None   Collection Time: 11/28/19  7:48 PM   Specimen: Nasal Mucosa; Nasopharyngeal  Result Value Ref Range Status   MRSA by PCR NEGATIVE NEGATIVE Final    Comment:        The GeneXpert MRSA Assay (FDA approved for NASAL specimens only), is one component of a comprehensive MRSA colonization surveillance program. It is not intended to diagnose MRSA infection nor to guide or monitor treatment for MRSA infections. Performed at [facility], [address] [place], [place] [address]      Radiology Studies: No results found.   Scheduled Meds: . carbamazepine  200 mg Oral TID  . clonazePAM  0.5 mg Oral BID  . donepezil  10 mg Oral QHS  . enoxaparin (LOVENOX) injection  40 mg Subcutaneous Daily  . insulin aspart  0-6 Units Subcutaneous TID WC  . insulin detemir  25 Units Subcutaneous q morning - 10a  . latanoprost  1 drop Both Eyes QHS  . lisinopril  20 mg Oral Daily  . memantine  10 mg Oral BID  . metFORMIN  1,000 mg Oral BID WC  . OLANZapine  10 mg Oral QHS  . oxybutynin  5  mg Oral BID  . simvastatin  40 mg Oral Daily  . tamsulosin  0.4 mg Oral Daily  . thiamine  100 mg Oral Daily   Continuous Infusions:   LOS: 5 days    Time spent: 25 mins.    Cipriano Bunker, MD [facility]   If 7PM-7AM, please contact night-coverage

## 2019-12-04 DIAGNOSIS — R270 Ataxia, unspecified: Secondary | ICD-10-CM | POA: Diagnosis not present

## 2019-12-04 LAB — GLUCOSE, CAPILLARY
Glucose-Capillary: 91 mg/dL (ref 70–99)
Glucose-Capillary: 135 mg/dL — ABNORMAL HIGH (ref 70–99)
Glucose-Capillary: 185 mg/dL — ABNORMAL HIGH (ref 70–99)
Glucose-Capillary: 173 mg/dL — ABNORMAL HIGH (ref 70–99)

## 2019-12-04 MED ORDER — INSULIN DETEMIR 100 UNIT/ML ~~LOC~~ SOLN
20.0000 [IU] | Freq: Every morning | SUBCUTANEOUS | Status: DC
  Administered 2019-12-04 – 2019-12-08 (×5): 20 [IU] via SUBCUTANEOUS
  Filled 2019-12-04 (×5): qty 0.2

## 2019-12-04 NOTE — Progress Notes (Signed)
Marland Kitchen  PROGRESS NOTE    Andrell Bergeson  MHD:[id number] DOB: June 14, 1941 DOA: 11/27/2019 PCP: Wandra Feinstein, MD   Brief Narrative:  Niccolo Pritchettis a 79 y.o.malewith a history of history of schizophrenia, dementia, type 2 diabetes, hypertension, hyperlipidemia. Patient currently lives at [facility]. As the patient has dementia and schizophrenia, the patient is unable to give history. History is obtained by caregiver. Patient has been having a gradual decline over the past week, but a more sudden decline over the past 12 hours. Since today, the patient has needed a fair amount assistance in walking as he is unable to balance. This is a new change. The patient had 2 falls earlier today: The first in the shower, then on the porch while smoking. Patient hit his head and left shoulder on the second fall. He was brought to the hospital for evaluation. No seeming palliating or provoking factors. No fevers, chills, nausea, vomiting, decreased appetite, slurred speech, weakness.  Patient does appear to have fairly significant dementia and does not have insight or judgment. Per the patient's caregiver, the patient has no meaningful relationship and does not have a power of attorney or guardian.  This morning was found to be lethargic and nearly unresponsive. A rapid response was called. ABG, CBC, and chemistry were ordered. The patient was found to be hypercarbic and he was saturating 98-100% on 2 L. He was taken off of the 2 liters, and SaO2 goals were adjusted to 88-92% in the computer. I believe that his decreased responsiveness was due to CO2 retention due to high FIO2. I have communicated this to nursing.  The patient has been evaluated by PT/OT. They have recommended SNF. The patient will need a guardian.   Assessment & Plan:   Principal Problem:   Ataxia Active Problems:   Controlled type 2 diabetes mellitus with complication, with long-term current use of insulin (HCC)  Schizophrenia (HCC)   Essential hypertension   History of subdural hematoma   Unspecified dementia with behavioral disturbance (HCC)   Hyperlipidemia associated with type 2 diabetes mellitus (HCC)   Altered mental status  Decreased level of consciousness: I believe that this is due to mild hypercarbia. The patient is responsive to noxious stimuli and will answer questions before going back to sleep. I have adjusted goal O2 sats to 88-92%. Remainder of work up for decreased level of responsiveness was unremarkable. CT head yesterday did not demonstrate acute abnormality.  Ataxia:He has been changed to inpatient status due to the complexity of his presentation, his multiple confounding comorbidities, and his decreased level of consciousness this morning. Vitamin B12 and TSH are within normal limits. HbA1c, Thiamine, and folate is pending. The TOC has been consult. This patient will need not be able to return to his previous group home setting as he is requiring too much care. PT/OT has recommended SNF.  Dementia: The patient at baseline is not decisional. Patient will need guardian of some sort as the patient has no judgment or insight and is not competent to make decisions for himself.  Schizophrenia: Continue tegretol, Klonopin, Zyprexa  Type 2 diabetes: Continue home insulin regimen. Follow glucoses with FSBS and SSI. Metformin was held.   History of subdural hematoma: Stable appearance on CT head. No acute intracranial abnormality on MRI.  Hypertension: Blood pressure is well controlled on lisinopril.   DVT prophylaxis:Lovenox Code Status:Full code presumed Family Communication:None Disposition Plan:Pt is from a group home which stated that they will not be able to take the  patient back. PT/OT has recommended SNF. He is not competent to make decisions for himself, so will need a guardian. He will require long term placement.   Consultants:   Neurology  Procedures:    Antimicrobials: Anti-infectives (From admission, onward)   None      Subjective: Patient was seen and examined at bedside, Patient is resting comfortably,  knows his name but does not know the place and time.  Objective: Vitals:   12/04/19 0836 12/04/19 0900 12/04/19 1037 12/04/19 1309  BP: 136/86  130/84   Pulse: 97  92   Resp: (!) 22  20   Temp:      TempSrc: Oral Oral  Axillary  SpO2: 95%  93%   Weight:      Height:        Intake/Output Summary (Last 24 hours) at 12/04/2019 1343 Last data filed at 12/04/2019 0634 Gross per 24 hour  Intake --  Output 1450 ml  Net -1450 ml   Filed Weights   11/27/19 1443  Weight: 93.9 kg    Examination:  General exam: Appears calm and comfortable  Respiratory system: Clear to auscultation. Respiratory effort normal. Cardiovascular system: S1 & S2 heard, RRR. No JVD, murmurs, rubs, gallops or clicks. No pedal edema. Gastrointestinal system: Abdomen is nondistended, soft and nontender. No organomegaly or masses felt. Normal bowel sounds heard. Central nervous system: Alert and oriented. No focal neurological deficits. Extremities: Not assessed Skin: No rashes, lesions or ulcers Psychiatry: Judgement and insight appear normal. Mood & affect appropriate.     Data Reviewed: I have personally reviewed following labs and imaging studies  CBC: Recent Labs  Lab 11/27/19 1618 11/28/19 0950 11/28/19 1059 12/01/19 0611 12/03/19 0448  WBC 8.0 5.9  --  5.5 5.5  NEUTROABS 3.6  --   --  2.9  --   HGB 11.6* 11.5*  --  12.0* 11.9*  HCT 37.2* 36.4* 35.1* 38.1* 37.4*  MCV 111.0* 109.3*  --  108.9* 109.0*  PLT 253 225  --  201 207   Basic Metabolic Panel: Recent Labs  Lab 11/27/19 1618 11/28/19 0950 12/01/19 0611 12/03/19 0448  NA 139 140 139 141  K 4.6 4.6 4.8 4.4  CL 103 104 106 105  CO2 28 30 25 28   GLUCOSE 192* 126* 116* 92  BUN 20 14 17 23   CREATININE 0.88 0.78 0.89 0.87  CALCIUM 8.6* 8.9 8.4* 8.7*   GFR: Estimated  Creatinine Clearance: 77.4 mL/min (by C-G formula based on SCr of 0.87 mg/dL). Liver Function Tests: Recent Labs  Lab 11/27/19 1618  AST 13*  ALT 15  ALKPHOS 79  BILITOT 0.4  PROT 6.1*  ALBUMIN 3.4*   No results for input(s): LIPASE, AMYLASE in the last 168 hours. Recent Labs  Lab 11/28/19 1059  AMMONIA 26   Coagulation Profile: No results for input(s): INR, PROTIME in the last 168 hours. Cardiac Enzymes: No results for input(s): CKTOTAL, CKMB, CKMBINDEX, TROPONINI in the last 168 hours. BNP (last 3 results) No results for input(s): PROBNP in the last 8760 hours. HbA1C: No results for input(s): HGBA1C in the last 72 hours. CBG: Recent Labs  Lab 12/03/19 1120 12/03/19 1634 12/03/19 2138 12/04/19 0629 12/04/19 1222  GLUCAP 162* 129* 114* 91 135*   Lipid Profile: No results for input(s): CHOL, HDL, LDLCALC, TRIG, CHOLHDL, LDLDIRECT in the last 72 hours. Thyroid Function Tests: No results for input(s): TSH, T4TOTAL, FREET4, T3FREE, THYROIDAB in the last 72 hours. Anemia Panel: No results for  input(s): VITAMINB12, FOLATE, FERRITIN, TIBC, IRON, RETICCTPCT in the last 72 hours. Sepsis Labs: No results for input(s): PROCALCITON, LATICACIDVEN in the last 168 hours.  Recent Results (from the past 240 hour(s))  SARS CORONAVIRUS 2 (TAT 6-24 HRS) Nasopharyngeal Nasopharyngeal Swab     Status: None   Collection Time: 11/27/19  5:18 PM   Specimen: Nasopharyngeal Swab  Result Value Ref Range Status   SARS Coronavirus 2 NEGATIVE NEGATIVE Final    Comment: (NOTE) SARS-CoV-2 target nucleic acids are NOT DETECTED. The SARS-CoV-2 RNA is generally detectable in upper and lower respiratory specimens during the acute phase of infection. Negative results do not preclude SARS-CoV-2 infection, do not rule out co-infections with other pathogens, and should not be used as the sole basis for treatment or other patient management decisions. Negative results must be combined with clinical  observations, patient history, and epidemiological information. The expected result is Negative. Fact Sheet for Patients: SugarRoll.be Fact Sheet for Healthcare Providers: https://www.woods-mathews.com/ This test is not yet approved or cleared by the [place] FDA and  has been authorized for detection and/or diagnosis of SARS-CoV-2 by FDA under an Emergency Use Authorization (EUA). This EUA will remain  in effect (meaning this test can be used) for the duration of the COVID-19 declaration under Section 56 4(b)(1) of the Act, 21 U.S.C. section 360bbb-3(b)(1), unless the authorization is terminated or revoked sooner. Performed at [facility], [place] [address], [place] , [place] [address]   MRSA PCR Screening     Status: None   Collection Time: 11/28/19  7:48 PM   Specimen: Nasal Mucosa; Nasopharyngeal  Result Value Ref Range Status   MRSA by PCR NEGATIVE NEGATIVE Final    Comment:        The GeneXpert MRSA Assay (FDA approved for NASAL specimens only), is one component of a comprehensive MRSA colonization surveillance program. It is not intended to diagnose MRSA infection nor to guide or monitor treatment for MRSA infections. Performed at [facility], [place] [address], [place], [place] [address]      Radiology Studies: No results found.   Scheduled Meds: . carbamazepine  200 mg Oral TID  . clonazePAM  0.5 mg Oral BID  . donepezil  10 mg Oral QHS  . enoxaparin (LOVENOX) injection  40 mg Subcutaneous Daily  . insulin aspart  0-6 Units Subcutaneous TID WC  . insulin detemir  20 Units Subcutaneous q morning - 10a  . latanoprost  1 drop Both Eyes QHS  . lisinopril  20 mg Oral Daily  . memantine  10 mg Oral BID  . OLANZapine  10 mg Oral QHS  . oxybutynin  5 mg Oral BID  . simvastatin  40 mg Oral Daily  . tamsulosin  0.4 mg Oral Daily  . thiamine  100 mg Oral Daily   Continuous Infusions:   LOS: 6 days    Time  spent: 25 mins.    Shawna Clamp, MD [facility]   If 7PM-7AM, please contact night-coverage

## 2019-12-04 NOTE — TOC Progression Note (Signed)
Transition of Care [facility]) - Progression Note    Patient Details  Name: Tyr Franca MRN: [id number] Date of Birth: 08/08/1941  [facility] CM/SW Contact  Nonda Lou, [place] Phone Number: 12/04/2019, 11:48 AM  Clinical Narrative:    CSW made telephone call to patient's nephew Everlean Alstrom to inquire on chosen SNF. CSW left a voicemail requesting a telephone callback.   Expected Discharge Plan: Skilled Nursing Facility Barriers to Discharge: Continued Medical Work up  Expected Discharge Plan and Services Expected Discharge Plan: Skilled Nursing Facility     Post Acute Care Choice: Skilled Nursing Facility Living arrangements for the past 2 months: Group Home                                       Social Determinants of Health (SDOH) Interventions    Readmission Risk Interventions No flowsheet data found.

## 2019-12-05 DIAGNOSIS — R270 Ataxia, unspecified: Secondary | ICD-10-CM | POA: Diagnosis not present

## 2019-12-05 LAB — VITAMIN B1: Vitamin B1 (Thiamine): 118.4 nmol/L (ref 66.5–200.0)

## 2019-12-05 LAB — GLUCOSE, CAPILLARY
Glucose-Capillary: 104 mg/dL — ABNORMAL HIGH (ref 70–99)
Glucose-Capillary: 122 mg/dL — ABNORMAL HIGH (ref 70–99)
Glucose-Capillary: 104 mg/dL — ABNORMAL HIGH (ref 70–99)
Glucose-Capillary: 135 mg/dL — ABNORMAL HIGH (ref 70–99)

## 2019-12-05 NOTE — Significant Event (Addendum)
Rapid Response Event Note  Overview: Called d/t RR-32  Initial Focused Assessment: Pt laying in bed on his side. Pt appears to be in no distress, however, he is taking quick shallow breaths.  Lungs diminished t/o. Skin warm and dry Pt repositioned on his back and RR decreased to 28 from 30s. Pt would not speak to RN but would follow my commands and shake head yes or no appropriately to questions. When asked if he was having trouble breathing, pt shook head no. HR-86, BP-109/62, RR-28-32, SpO2-88-92% on RA (SpO2 goal 88-92%). While in room observing pt, SpO2 would drop to 82-85% while sleeping. This was happening frequently and would go back up quickly most of the time. At one point, SpO2 dropped and maintained at 82-83% so pt was placed on 2L [place] with SpO2 increasing to 88%.  Interventions: CPAP Plan of Care (if not transferred): ???OSA??? CPAP ordered. Hopefully this will help with his tachypnea as well as his SpO2.  Try to maintain SpO2 between 88-92%. Please call RRT if further assistance needed.  Event Summary:  Bruna Potter, NP notified by RN and my myself at 0049  Called: [phone] Arrived: 0010 Ended: 0025  Terrilyn Saver

## 2019-12-05 NOTE — TOC Progression Note (Signed)
Transition of Care [facility]) - Progression Note    Patient Details  Name: Andre Rowe MRN: [id number] Date of Birth: Aug 29, 1940  Transition of Care [facility]) CM/SW Contact  Nonda Lou, [place] Phone Number: 12/05/2019, 3:54 PM  Clinical Narrative:    CSW followed up with patient's nephew Andre Rowe to inquire on SNF choice. CSW provided nephew with the current facilities offering bed placement. Nephew stated his preference is [facility]. He stated he was also considering [address] [place]. He expressed no further questions at this time, CSW will continue to follow and assist with discharge planning needs.     Expected Discharge Plan: Skilled Nursing Facility Barriers to Discharge: Continued Medical Work up  Expected Discharge Plan and Services Expected Discharge Plan: Skilled Nursing Facility     Post Acute Care Choice: Skilled Nursing Facility Living arrangements for the past 2 months: Group Home                                       Social Determinants of Health (SDOH) Interventions    Readmission Risk Interventions No flowsheet data found.

## 2019-12-05 NOTE — Progress Notes (Signed)
Andre Rowe Kitchen  PROGRESS NOTE    Andre Rowe  KYH:[id number] DOB: 1940/09/19 DOA: 11/27/2019 PCP: Wandra Feinstein, MD   Brief Narrative:  Andre Pritchettis a 79 y.o.malewith a history of history of schizophrenia, dementia, type 2 diabetes, hypertension, hyperlipidemia. Patient currently lives at [facility]. As the patient has dementia and schizophrenia, the patient is unable to give history. History is obtained by caregiver. Patient has been having a gradual decline over the past week, but a more sudden decline over the past 12 hours. Since today, the patient has needed a fair amount assistance in walking as he is unable to balance. This is a new change. The patient had 2 falls earlier today: The first in the shower, then on the porch while smoking. Patient hit his head and left shoulder on the second fall. He was brought to the hospital for evaluation. No seeming palliating or provoking factors. No fevers, chills, nausea, vomiting, decreased appetite, slurred speech, weakness.  Patient does appear to have fairly significant dementia and does not have insight or judgment. Per the patient's caregiver, the patient has no meaningful relationship and does not have a power of attorney or guardian.  This morning was found to be lethargic and nearly unresponsive. A rapid response was called. ABG, CBC, and chemistry were ordered. The patient was found to be hypercarbic and he was saturating 98-100% on 2 L. He was taken off of the 2 liters, and SaO2 goals were adjusted to 88-92% in the computer. I believe that his decreased responsiveness was due to CO2 retention due to high FIO2. I have communicated this to nursing.  The patient has been evaluated by PT/OT. They have recommended SNF. The patient will need a guardian.   Assessment & Plan:   Principal Problem:   Ataxia Active Problems:   Controlled type 2 diabetes mellitus with complication, with long-term current use of insulin (HCC)    Schizophrenia (HCC)   Essential hypertension   History of subdural hematoma   Unspecified dementia with behavioral disturbance (HCC)   Hyperlipidemia associated with type 2 diabetes mellitus (HCC)   Altered mental status  Decreased level of consciousness: I believe that this is due to mild hypercarbia. The patient is responsive to noxious stimuli and will answer questions before going back to sleep. I have adjusted goal O2 sats to 88-92%. Remainder of work up for decreased level of responsiveness was unremarkable. CT head yesterday did not demonstrate acute abnormality.  Ataxia:He has been changed to inpatient status due to the complexity of his presentation, his multiple confounding comorbidities, and his decreased level of consciousness this morning. Vitamin B12 and TSH are within normal limits. HbA1c, Thiamine, and folate is normal. The TOC has been consulted. This patient will need not be able to return to his previous group home setting as he is requiring too much care. PT/OT has recommended SNF.  Dementia: The patient at baseline is not decisional. Patient will need guardian of some sort as the patient has no judgment or insight and is not competent to make decisions for himself.  Schizophrenia: Continue tegretol, Klonopin and Zyprexa  Type 2 diabetes: Continue home insulin regimen. Follow glucoses with FSBS and SSI. Metformin was held.   History of subdural hematoma: Stable appearance on CT head. No acute intracranial abnormality on MRI.  Hypertension: Blood pressure is well controlled on lisinopril.   DVT prophylaxis:Lovenox Code Status:Full code presumed Family Communication:None Disposition Plan:Pt is from a group home which stated that they will not be able  to take the patient back. PT/OT has recommended SNF. He is not competent to make decisions for himself, so will need a guardian. He will require long term placement.   Consultants:   Neurology  Procedures:   Antimicrobials: Anti-infectives (From admission, onward)   None      Subjective: Patient was seen and examined at bedside, Patient is resting comfortably,  knows his name but does not know the place and time.  Patient sleeps late in the night very sleepy in the morning when examined.  He is more alert, awake during afternoon time.  Objective: Vitals:   12/05/19 0033 12/05/19 0310 12/05/19 0908 12/05/19 1305  BP:  (!) 124/59 130/67 121/69  Pulse:  71 81 83  Resp:  (!) 22 20 18   Temp:  98 F (36.7 C) 97.6 F (36.4 C) 97.9 F (36.6 C)  TempSrc:  Axillary Axillary Oral  SpO2: (!) 88% 95% 94% 92%  Weight:      Height:        Intake/Output Summary (Last 24 hours) at 12/05/2019 1357 Last data filed at 12/05/2019 1200 Gross per 24 hour  Intake 270 ml  Output 550 ml  Net -280 ml   Filed Weights   11/27/19 1443  Weight: 93.9 kg    Examination:  General exam: Appears calm and comfortable  Respiratory system: Clear to auscultation. Respiratory effort normal. Cardiovascular system: S1 & S2 heard, RRR. No JVD, murmurs, rubs, gallops or clicks. No pedal edema. Gastrointestinal system: Abdomen is nondistended, soft and nontender. No organomegaly or masses felt. Normal bowel sounds heard. Central nervous system: Alert and oriented. No focal neurological deficits. Extremities: Not assessed Skin: No rashes, lesions or ulcers Psychiatry: Judgement and insight appear normal. Mood & affect appropriate.     Data Reviewed: I have personally reviewed following labs and imaging studies  CBC: Recent Labs  Lab 12/01/19 0611 12/03/19 0448  WBC 5.5 5.5  NEUTROABS 2.9  --   HGB 12.0* 11.9*  HCT 38.1* 37.4*  MCV 108.9* 109.0*  PLT 201 161   Basic Metabolic Panel: Recent Labs  Lab 12/01/19 0611 12/03/19 0448  NA 139 141  K 4.8 4.4  CL 106 105  CO2 25 28  GLUCOSE 116* 92  BUN 17 23  CREATININE 0.89 0.87  CALCIUM 8.4* 8.7*   GFR: Estimated Creatinine Clearance: 77.4 mL/min  (by C-G formula based on SCr of 0.87 mg/dL). Liver Function Tests: No results for input(s): AST, ALT, ALKPHOS, BILITOT, PROT, ALBUMIN in the last 168 hours. No results for input(s): LIPASE, AMYLASE in the last 168 hours. No results for input(s): AMMONIA in the last 168 hours. Coagulation Profile: No results for input(s): INR, PROTIME in the last 168 hours. Cardiac Enzymes: No results for input(s): CKTOTAL, CKMB, CKMBINDEX, TROPONINI in the last 168 hours. BNP (last 3 results) No results for input(s): PROBNP in the last 8760 hours. HbA1C: No results for input(s): HGBA1C in the last 72 hours. CBG: Recent Labs  Lab 12/04/19 1222 12/04/19 1648 12/04/19 2105 12/05/19 0621 12/05/19 1306  GLUCAP 135* 185* 173* 104* 122*   Lipid Profile: No results for input(s): CHOL, HDL, LDLCALC, TRIG, CHOLHDL, LDLDIRECT in the last 72 hours. Thyroid Function Tests: No results for input(s): TSH, T4TOTAL, FREET4, T3FREE, THYROIDAB in the last 72 hours. Anemia Panel: No results for input(s): VITAMINB12, FOLATE, FERRITIN, TIBC, IRON, RETICCTPCT in the last 72 hours. Sepsis Labs: No results for input(s): PROCALCITON, LATICACIDVEN in the last 168 hours.  Recent Results (from the past  240 hour(s))  SARS CORONAVIRUS 2 (TAT 6-24 HRS) Nasopharyngeal Nasopharyngeal Swab     Status: None   Collection Time: 11/27/19  5:18 PM   Specimen: Nasopharyngeal Swab  Result Value Ref Range Status   SARS Coronavirus 2 NEGATIVE NEGATIVE Final    Comment: (NOTE) SARS-CoV-2 target nucleic acids are NOT DETECTED. The SARS-CoV-2 RNA is generally detectable in upper and lower respiratory specimens during the acute phase of infection. Negative results do not preclude SARS-CoV-2 infection, do not rule out co-infections with other pathogens, and should not be used as the sole basis for treatment or other patient management decisions. Negative results must be combined with clinical observations, patient history, and  epidemiological information. The expected result is Negative. Fact Sheet for Patients: HairSlick.no Fact Sheet for Healthcare Providers: quierodirigir.com This test is not yet approved or cleared by the [place] FDA and  has been authorized for detection and/or diagnosis of SARS-CoV-2 by FDA under an Emergency Use Authorization (EUA). This EUA will remain  in effect (meaning this test can be used) for the duration of the COVID-19 declaration under Section 56 4(b)(1) of the Act, 21 U.S.C. section 360bbb-3(b)(1), unless the authorization is terminated or revoked sooner. Performed at [facility], [address], [place], [place] [address]   MRSA PCR Screening     Status: None   Collection Time: 11/28/19  7:48 PM   Specimen: Nasal Mucosa; Nasopharyngeal  Result Value Ref Range Status   MRSA by PCR NEGATIVE NEGATIVE Final    Comment:        The GeneXpert MRSA Assay (FDA approved for NASAL specimens only), is one component of a comprehensive MRSA colonization surveillance program. It is not intended to diagnose MRSA infection nor to guide or monitor treatment for MRSA infections. Performed at [facility], [address] [place], [place] [address]      Radiology Studies: No results found.   Scheduled Meds: . carbamazepine  200 mg Oral TID  . clonazePAM  0.5 mg Oral BID  . donepezil  10 mg Oral QHS  . enoxaparin (LOVENOX) injection  40 mg Subcutaneous Daily  . insulin aspart  0-6 Units Subcutaneous TID WC  . insulin detemir  20 Units Subcutaneous q morning - 10a  . latanoprost  1 drop Both Eyes QHS  . lisinopril  20 mg Oral Daily  . memantine  10 mg Oral BID  . OLANZapine  10 mg Oral QHS  . oxybutynin  5 mg Oral BID  . simvastatin  40 mg Oral Daily  . tamsulosin  0.4 mg Oral Daily  . thiamine  100 mg Oral Daily   Continuous Infusions:   LOS: 7 days    Time spent: 25 mins.    Cipriano Bunker,  MD [facility]   If 7PM-7AM, please contact night-coverage

## 2019-12-06 DIAGNOSIS — R270 Ataxia, unspecified: Secondary | ICD-10-CM | POA: Diagnosis not present

## 2019-12-06 LAB — CBC
HCT: 36.2 % — ABNORMAL LOW (ref 39.0–52.0)
Hemoglobin: 11.5 g/dL — ABNORMAL LOW (ref 13.0–17.0)
MCH: 35.1 pg — ABNORMAL HIGH (ref 26.0–34.0)
MCHC: 31.8 g/dL (ref 30.0–36.0)
MCV: 110.4 fL — ABNORMAL HIGH (ref 80.0–100.0)
Platelets: 250 10*3/uL (ref 150–400)
RBC: 3.28 MIL/uL — ABNORMAL LOW (ref 4.22–5.81)
RDW: 14.9 % (ref 11.5–15.5)
WBC: 4.9 10*3/uL (ref 4.0–10.5)
nRBC: 0 % (ref 0.0–0.2)

## 2019-12-06 LAB — BASIC METABOLIC PANEL
Anion gap: 10 (ref 5–15)
BUN: 30 mg/dL — ABNORMAL HIGH (ref 8–23)
CO2: 28 mmol/L (ref 22–32)
Calcium: 8.9 mg/dL (ref 8.9–10.3)
Chloride: 104 mmol/L (ref 98–111)
Creatinine, Ser: 1.3 mg/dL — ABNORMAL HIGH (ref 0.61–1.24)
GFR calc Af Amer: >60 mL/min (ref >60)
GFR calc non Af Amer: 53 mL/min — ABNORMAL LOW (ref >60)
Glucose, Bld: 105 mg/dL — ABNORMAL HIGH (ref 70–99)
Potassium: 4.5 mmol/L (ref 3.5–5.1)
Sodium: 142 mmol/L (ref 135–145)

## 2019-12-06 LAB — GLUCOSE, CAPILLARY
Glucose-Capillary: 106 mg/dL — ABNORMAL HIGH (ref 70–99)
Glucose-Capillary: 185 mg/dL — ABNORMAL HIGH (ref 70–99)
Glucose-Capillary: 127 mg/dL — ABNORMAL HIGH (ref 70–99)
Glucose-Capillary: 80 mg/dL (ref 70–99)

## 2019-12-06 LAB — PHOSPHORUS: Phosphorus: 5.1 mg/dL — ABNORMAL HIGH (ref 2.5–4.6)

## 2019-12-06 LAB — MAGNESIUM: Magnesium: 2.2 mg/dL (ref 1.7–2.4)

## 2019-12-06 NOTE — Progress Notes (Signed)
Physical Therapy Treatment Patient Details Name: Andre Rowe MRN: [id number] DOB: [birth year] Today's Date: 12/06/2019    History of Present Illness Andre Rowe is a 79 y.o. male admitted 11/27/19 s/p x2 falls hitting head and shoulder with generalized weakness. CT C spine- multilvel degenerative changes, no fx. CT head: negative. CT shoulder negative. MRI brain no acute changes.  PMHx: schizophrenia, dementia, T2DM, HTN, HLD. Patient currently lives at [facility]    PT Comments    Patient initially refusing PT. Agreed to allow [place] to help him remove wet linens and provide dry linens (his condom catheter was off on arrival--NT notified). He could be directed to assist with rolling side to side (bending legs, turning head, reaching with UE as directed with some delay and tactile cues). He began to be less amenable to instruction as changing his wet gown with pt ultimately yelling and swinging LUE as if to Ship broker. Patient's NT arrived and able to assist with calming patient. Patient left in her care and cooperating with her.     Follow Up Recommendations  SNF;Supervision/Assistance - 24 hour(if group home cannot provide min to moderate assist)     Equipment Recommendations  Other (comment)(TBa)    Recommendations for Other Services       Precautions / Restrictions Precautions Precautions: Fall Restrictions Weight Bearing Restrictions: No    Mobility  Bed Mobility Overal bed mobility: Needs Assistance Bed Mobility: Rolling Rolling: Max assist;+2 for physical assistance         General bed mobility comments: Patient willing to assist with bed mobility to remove wet linens. Required max verbal and tactile cues for pt to participate. Refused to attempt sit EOB  Transfers                    Ambulation/Gait                 Stairs             Wheelchair Mobility    Modified Rankin (Stroke Patients Only)       Balance                                             Cognition Arousal/Alertness: Awake/alert Behavior During Therapy: Agitated Overall Cognitive Status: No family/caregiver present to determine baseline cognitive functioning(group home resident)                                 General Comments: patient immediately stated he was not going to get OOB and try to walk; able to gain some cooperation for bed mobility to change sheets/gown, but then became agitated and swinging LUE      Exercises      General Comments General comments (skin integrity, edema, etc.): towards end of session as pt escalating and swinging LUE as if to Ship broker, nurse tech arrived and able to calm pt down (clearly had developed rapport with her pt)      Pertinent Vitals/Pain Faces Pain Scale: No hurt    Home Living                      Prior Function            PT Goals (current goals can now be found in the care  plan section) Acute Rehab PT Goals Time For Goal Achievement: 12/13/19 Potential to Achieve Goals: Fair Progress towards PT goals: Not progressing toward goals - comment(agitated this date)    Frequency    Min 2X/week      PT Plan Current plan remains appropriate    Co-evaluation              AM-PAC PT "6 Clicks" Mobility   Outcome Measure  Help needed turning from your back to your side while in a flat bed without using bedrails?: A Lot Help needed moving from lying on your back to sitting on the side of a flat bed without using bedrails?: A Lot Help needed moving to and from a bed to a chair (including a wheelchair)?: A Lot Help needed standing up from a chair using your arms (e.g., wheelchair or bedside chair)?: A Lot Help needed to walk in hospital room?: Total Help needed climbing 3-5 steps with a railing? : Total 6 Click Score: 10    End of Session   Activity Tolerance: Treatment limited secondary to agitation Patient left: in  bed;with nursing/sitter in room(NT aware and to set bed alarm, give call bell) Nurse Communication: Other (comment)(incr agitation this date; would not attempt EOB) PT Visit Diagnosis: Unsteadiness on feet (R26.81);Other symptoms and signs involving the nervous system (R29.898);Difficulty in walking, not elsewhere classified (R26.2);Muscle weakness (generalized) (M62.81)     Time: 3875-6433 PT Time Calculation (min) (ACUTE ONLY): 14 min  Charges:  $Therapeutic Activity: 8-22 mins                      Arby Barrette, PT Pager [phone]    Rexanne Mano 12/06/2019, 5:20 PM

## 2019-12-06 NOTE — Progress Notes (Signed)
PROGRESS NOTE    Andre Rowe  EXH:[id number] DOB: 1941/06/02 DOA: 11/27/2019 PCP: Wandra Feinstein, MD   Brief Narrative:  79 y.o.malewith a history of history of schizophrenia, dementia, type 2 diabetes, hypertension, hyperlipidemia. Patient currently lives at [facility]. As the patient has dementia and schizophrenia, the patient is unable to give history. History is obtained by caregiver. Patient has been having a gradual decline over the past week, but a more sudden decline over the past 12 hours. Since today, the patient has needed a fair amount assistance in walking as he is unable to balance. This is a new change. The patient had 2 falls earlier today: The first in the shower, then on the porch while smoking. Patient hit his head and left shoulder on the second fall. He was brought to the hospital for evaluation. No seeming palliating or provoking factors. No fevers, chills, nausea, vomiting, decreased appetite, slurred speech, weakness.  Patient does appear to have fairly significant dementia and does not have insight or judgment. Per the patient's caregiver, the patient has no meaningful relationship and does not have a power of attorney or guardian.  Patient treated for ataxia/ambulatory dysfunction,dementia schizophrenia diabetes hypertension.  Group home refused to take him back, has no guardian and at this time looking into long-term placement  Subjective: Alert,awake,  Talks with lips smacking- difficult to comprehend.  Able to tell me his name. Follows commands, moving all extremities On RA, not in distress BMP creatinine 1.3, blood sugar 185.  Assessment & Plan:  Ataxia/ambulatory dysfunction: Mri, CT head, CT C-spine brain no acute finding.  B12 TSH normal A1c thiamine folate normal, continue PT OT.  Supportive care.  Looking into skilled nursing facility placement.  Episode of decreased level of consciousness 4/17 has since resolved.  CT head was  unremarkable.  He is alert awake oriented.  Controlled type 2 diabetes mellitus with complication, with long-term current use of insulin: hba1c is 7.6 Continue current insulin regimen and monitor blood sugar.  Hold oral meds. Recent Labs  Lab 12/05/19 1306 12/05/19 1650 12/05/19 2120 12/06/19 0614 12/06/19 1124  GLUCAP 122* 104* 135* 106* 185*   Lab Results  Component Value Date   HGBA1C 7.6 (H) 11/28/2019   Schizophrenia: Speech with lipsmacking continue home meds with Tegretol Klonopin and Zyprexa  Essential hypertension: Blood pressure is controlled.  Continue lisinopril.  History of subdural hematoma-stable CT head  Unspecified dementia with behavioral disturbance -continue supportive care  Hyperlipidemia associated with type 2 diabetes mellitus  DVT prophylaxis:lovenox Code Status: full  Family Communication: plan of care discussed with patient at bedside. Status is: Inpatient  Remains inpatient appropriate because:Unsafe d/c plan   Dispo: The patient is from: Group home              Anticipated d/c is to: SNF              Anticipated d/c date is: 1 day              Patient currently is medically stable to d/c. Nutrition: Diet Order            Diet Carb Modified Fluid consistency: Thin; Room service appropriate? Yes  Diet effective now             Body mass index is 33.41 kg/m.  Consultants: none Procedures:see note Microbiology:see note  Medications: Scheduled Meds: . carbamazepine  200 mg Oral TID  . clonazePAM  0.5 mg Oral BID  . donepezil  10  mg Oral QHS  . enoxaparin (LOVENOX) injection  40 mg Subcutaneous Daily  . insulin aspart  0-6 Units Subcutaneous TID WC  . insulin detemir  20 Units Subcutaneous q morning - 10a  . latanoprost  1 drop Both Eyes QHS  . lisinopril  20 mg Oral Daily  . memantine  10 mg Oral BID  . OLANZapine  10 mg Oral QHS  . oxybutynin  5 mg Oral BID  . simvastatin  40 mg Oral Daily  . tamsulosin  0.4 mg Oral Daily  .  thiamine  100 mg Oral Daily   Continuous Infusions:  Antimicrobials: Anti-infectives (From admission, onward)   None       Objective: Vitals: Today's Vitals   12/06/19 0334 12/06/19 0335 12/06/19 0735 12/06/19 0800  BP:  132/68 117/67   Pulse: 78 61 71   Resp: (!) 22 20 (!) 21   Temp:  98.4 F (36.9 C) 98.3 F (36.8 C)   TempSrc:  Axillary Oral   SpO2: 93% 96% 97%   Weight:      Height:      PainSc:    0-No pain    Intake/Output Summary (Last 24 hours) at 12/06/2019 0947 Last data filed at 12/05/2019 1828 Gross per 24 hour  Intake 180 ml  Output 500 ml  Net -320 ml   Filed Weights   11/27/19 1443  Weight: 93.9 kg   Weight change:    Intake/Output from previous day: 04/18 0701 - 04/19 0700 In: 270 [P.O.:270] Out: 500 [Urine:500] Intake/Output this shift: No intake/output data recorded.  Examination:  General exam: AAO ,NAD, weak appearing. HEENT:Oral mucosa moist, Ear/Nose WNL grossly,dentition normal. Respiratory system: bilaterally clear,no wheezing or crackles,no use of accessory muscle, non tender. Cardiovascular system: S1 & S2 +, regular, No JVD. Gastrointestinal system: Abdomen soft, NT,ND, BS+. Nervous System:Alert, awake, follows some commands, speaking with lipsmacking, moving extremities and grossly nonfocal Extremities: No edema, distal peripheral pulses palpable.  Skin: No rashes,no icterus. MSK: Normal muscle bulk,tone, power  Data Reviewed: I have personally reviewed following labs and imaging studies CBC: Recent Labs  Lab 12/01/19 0611 12/03/19 0448 12/06/19 0431  WBC 5.5 5.5 4.9  NEUTROABS 2.9  --   --   HGB 12.0* 11.9* 11.5*  HCT 38.1* 37.4* 36.2*  MCV 108.9* 109.0* 110.4*  PLT 201 207 250   Basic Metabolic Panel: Recent Labs  Lab 12/01/19 0611 12/03/19 0448 12/06/19 0431  NA 139 141 142  K 4.8 4.4 4.5  CL 106 105 104  CO2 25 28 28   GLUCOSE 116* 92 105*  BUN 17 23 30*  CREATININE 0.89 0.87 1.30*  CALCIUM 8.4* 8.7*  8.9  MG  --   --  2.2  PHOS  --   --  5.1*   GFR: Estimated Creatinine Clearance: 51.8 mL/min (A) (by C-G formula based on SCr of 1.3 mg/dL (H)). Liver Function Tests: No results for input(s): AST, ALT, ALKPHOS, BILITOT, PROT, ALBUMIN in the last 168 hours. No results for input(s): LIPASE, AMYLASE in the last 168 hours. No results for input(s): AMMONIA in the last 168 hours. Coagulation Profile: No results for input(s): INR, PROTIME in the last 168 hours. Cardiac Enzymes: No results for input(s): CKTOTAL, CKMB, CKMBINDEX, TROPONINI in the last 168 hours. BNP (last 3 results) No results for input(s): PROBNP in the last 8760 hours. HbA1C: No results for input(s): HGBA1C in the last 72 hours. CBG: Recent Labs  Lab 12/05/19 0621 12/05/19 1306 12/05/19 1650 12/05/19  2120 12/06/19 0614  GLUCAP 104* 122* 104* 135* 106*   Lipid Profile: No results for input(s): CHOL, HDL, LDLCALC, TRIG, CHOLHDL, LDLDIRECT in the last 72 hours. Thyroid Function Tests: No results for input(s): TSH, T4TOTAL, FREET4, T3FREE, THYROIDAB in the last 72 hours. Anemia Panel: No results for input(s): VITAMINB12, FOLATE, FERRITIN, TIBC, IRON, RETICCTPCT in the last 72 hours. Sepsis Labs: No results for input(s): PROCALCITON, LATICACIDVEN in the last 168 hours.  Recent Results (from the past 240 hour(s))  SARS CORONAVIRUS 2 (TAT 6-24 HRS) Nasopharyngeal Nasopharyngeal Swab     Status: None   Collection Time: 11/27/19  5:18 PM   Specimen: Nasopharyngeal Swab  Result Value Ref Range Status   SARS Coronavirus 2 NEGATIVE NEGATIVE Final    Comment: (NOTE) SARS-CoV-2 target nucleic acids are NOT DETECTED. The SARS-CoV-2 RNA is generally detectable in upper and lower respiratory specimens during the acute phase of infection. Negative results do not preclude SARS-CoV-2 infection, do not rule out co-infections with other pathogens, and should not be used as the sole basis for treatment or other patient management  decisions. Negative results must be combined with clinical observations, patient history, and epidemiological information. The expected result is Negative. Fact Sheet for Patients: SugarRoll.be Fact Sheet for Healthcare Providers: https://www.woods-mathews.com/ This test is not yet approved or cleared by the [place] FDA and  has been authorized for detection and/or diagnosis of SARS-CoV-2 by FDA under an Emergency Use Authorization (EUA). This EUA will remain  in effect (meaning this test can be used) for the duration of the COVID-19 declaration under Section 56 4(b)(1) of the Act, 21 U.S.C. section 360bbb-3(b)(1), unless the authorization is terminated or revoked sooner. Performed at [facility], [place] [address], [place], [place] [address]   MRSA PCR Screening     Status: None   Collection Time: 11/28/19  7:48 PM   Specimen: Nasal Mucosa; Nasopharyngeal  Result Value Ref Range Status   MRSA by PCR NEGATIVE NEGATIVE Final    Comment:        The GeneXpert MRSA Assay (FDA approved for NASAL specimens only), is one component of a comprehensive MRSA colonization surveillance program. It is not intended to diagnose MRSA infection nor to guide or monitor treatment for MRSA infections. Performed at [facility], [place] [address], [place], [place] [address]       Radiology Studies: No results found.   LOS: 8 days   Time spent: More than 50% of that time was spent in counseling and/or coordination of care.  Antonieta Pert, MD [facility]  12/06/2019, 9:47 AM

## 2019-12-07 DIAGNOSIS — R270 Ataxia, unspecified: Secondary | ICD-10-CM | POA: Diagnosis not present

## 2019-12-07 LAB — GLUCOSE, CAPILLARY
Glucose-Capillary: 106 mg/dL — ABNORMAL HIGH (ref 70–99)
Glucose-Capillary: 210 mg/dL — ABNORMAL HIGH (ref 70–99)
Glucose-Capillary: 130 mg/dL — ABNORMAL HIGH (ref 70–99)
Glucose-Capillary: 68 mg/dL — ABNORMAL LOW (ref 70–99)
Glucose-Capillary: 265 mg/dL — ABNORMAL HIGH (ref 70–99)

## 2019-12-07 LAB — SARS CORONAVIRUS 2 (TAT 6-24 HRS): SARS Coronavirus 2: NEGATIVE

## 2019-12-07 NOTE — TOC Progression Note (Signed)
Transition of Care [facility]) - Progression Note    Patient Details  Name: Andre Rowe MRN: [id number] Date of Birth: Jan 04, 1941  Transition of Care [facility]) CM/SW Contact  Terrilee Croak, Student-Social Work Phone Number: 12/07/2019, 11:45 AM  Clinical Narrative:    MSW Intern spoke with pt's nephew and he stated he would like [facility] agreed and said they can take him tomorrow. Covid test ordered. SW will follow for discharge.    Expected Discharge Plan: Skilled Nursing Facility Barriers to Discharge: Continued Medical Work up  Expected Discharge Plan and Services Expected Discharge Plan: Skilled Nursing Facility     Post Acute Care Choice: Skilled Nursing Facility Living arrangements for the past 2 months: Group Home                                       Social Determinants of Health (SDOH) Interventions    Readmission Risk Interventions No flowsheet data found.

## 2019-12-07 NOTE — Progress Notes (Signed)
Pt refused cpap

## 2019-12-07 NOTE — Progress Notes (Signed)
PROGRESS NOTE    Andre Rowe  BPZ:[id number] DOB: 02-24-1941 DOA: 11/27/2019 PCP: Wandra Feinstein, MD   Brief Narrative:  79 y.o.malewith a history of history of schizophrenia, dementia, type 2 diabetes, hypertension, hyperlipidemia. Patient currently lives at [facility]. As the patient has dementia and schizophrenia, the patient is unable to give history. History is obtained by caregiver. Patient has been having a gradual decline over the past week, but a more sudden decline over the past 12 hours. Since today, the patient has needed a fair amount assistance in walking as he is unable to balance. This is a new change. The patient had 2 falls earlier today: The first in the shower, then on the porch while smoking. Patient hit his head and left shoulder on the second fall. He was brought to the hospital for evaluation. No seeming palliating or provoking factors. No fevers, chills, nausea, vomiting, decreased appetite, slurred speech, weakness.  Patient does appear to have fairly significant dementia and does not have insight or judgment. Per the patient's caregiver, the patient has no meaningful relationship and does not have a power of attorney or guardian.  Patient treated for ataxia/ambulatory dysfunction,dementia schizophrenia diabetes hypertension.  Group home refused to take him back, has no guardian and at this time looking into long-term placement  Subjective:  No acute events overnight.  Afebrile T-max 99. Alert,awake,  Talks with lips smacking- difficult to comprehend.  Able to tell me his name. Follows commands, moving all extremities What can I do for you he replies " Send me home" On RA, not in distress BMP creatinine 1.3  Assessment & Plan:  Ataxia/ambulatory dysfunction: Mri, CT head, CT C-spine brain no acute finding, showed generalized age-related cerebral atrophy with chronic small vessel ischemic disease.B12 TSH normal A1c thiamine folate normal,  continue PT OT.  Supportive care.  Awaiting SNF  Episode of decreased level of consciousness 4/17 has since resolved.  CT head was unremarkable.  He is alert awake oriented.  Controlled type 2 diabetes mellitus with complication, with long-term current use of insulin: hba1c is 7.6 Continue current insulin regimen and monitor blood sugar.  Hold oral meds. Recent Labs  Lab 12/06/19 0614 12/06/19 1124 12/06/19 1750 12/06/19 2113 12/07/19 0738  GLUCAP 106* 185* 127* 80 106*   Lab Results  Component Value Date   HGBA1C 7.6 (H) 11/28/2019   Schizophrenia: Speech with lipsmacking continue home meds with Tegretol Klonopin and Zyprexa  Essential hypertension: Blood pressure is controlled.  Continue lisinopril.  History of subdural hematoma-stable CT head  Unspecified dementia with behavioral disturbance -continue supportive care  DVT prophylaxis:lovenox Code Status: full  Family Communication: plan of care discussed with patient at bedside.  Status is: Inpatient Remains inpatient appropriate because:Unsafe d/c plan  Dispo: The patient is from: Group home              Anticipated d/c is to: SNF              Anticipated d/c date is: 1 day              Patient currently is medically stable to d/c. COVID ordered for SNF. Nutrition: Diet Order            Diet Carb Modified Fluid consistency: Thin; Room service appropriate? Yes  Diet effective now             Body mass index is 33.41 kg/m.  Consultants: none Procedures:see note Microbiology:see note  Medications: Scheduled Meds:  carbamazepine  200 mg Oral TID   clonazePAM  0.5 mg Oral BID   donepezil  10 mg Oral QHS   enoxaparin (LOVENOX) injection  40 mg Subcutaneous Daily   insulin aspart  0-6 Units Subcutaneous TID WC   insulin detemir  20 Units Subcutaneous q morning - 10a   latanoprost  1 drop Both Eyes QHS   lisinopril  20 mg Oral Daily   memantine  10 mg Oral BID   OLANZapine  10 mg Oral QHS    oxybutynin  5 mg Oral BID   simvastatin  40 mg Oral Daily   tamsulosin  0.4 mg Oral Daily   thiamine  100 mg Oral Daily   Continuous Infusions:  Antimicrobials: Anti-infectives (From admission, onward)   None       Objective: Vitals: Today's Vitals   12/06/19 2038 12/07/19 0030 12/07/19 0341 12/07/19 0800  BP: 138/87 120/83 (!) 157/99 (!) 148/70  Pulse: 95 75 77   Resp: 20 20 18    Temp: 98.5 F (36.9 C) 98.2 F (36.8 C) 98.5 F (36.9 C) 98.3 F (36.8 C)  TempSrc: Oral Oral Oral Oral  SpO2: 98% 95% 99%   Weight:      Height:      PainSc: 0-No pain  0-No pain 0-No pain    Intake/Output Summary (Last 24 hours) at 12/07/2019 1017 Last data filed at 12/06/2019 1852 Gross per 24 hour  Intake --  Output 1225 ml  Net -1225 ml   Filed Weights   11/27/19 1443  Weight: 93.9 kg   Weight change:    Intake/Output from previous day: 04/19 0701 - 04/20 0700 In: -  Out: 1225 [Urine:1225] Intake/Output this shift: No intake/output data recorded.  Examination:  General exam: AAO, conversant, difficulty with complaints and with baseline speech/psych issues, NAD, weak appearing. HEENT:Oral mucosa moist, Ear/Nose WNL grossly,dentition normal. Respiratory system: bilaterally clear,no wheezing or crackles,no use of accessory muscle, non tender. Cardiovascular system: S1 & S2 +, regular, No JVD. Gastrointestinal system: Abdomen soft, NT,ND, BS+. Nervous System:Alert, awake, follows some commands, speaking with lipsmacking, moving extremities and grossly nonfocal Extremities: No edema, distal peripheral pulses palpable.  Skin: No rashes,no icterus. MSK: Normal muscle bulk,tone, power  Data Reviewed: I have personally reviewed following labs and imaging studies CBC: Recent Labs  Lab 12/01/19 0611 12/03/19 0448 12/06/19 0431  WBC 5.5 5.5 4.9  NEUTROABS 2.9  --   --   HGB 12.0* 11.9* 11.5*  HCT 38.1* 37.4* 36.2*  MCV 108.9* 109.0* 110.4*  PLT 201 207 250   Basic  Metabolic Panel: Recent Labs  Lab 12/01/19 0611 12/03/19 0448 12/06/19 0431  NA 139 141 142  K 4.8 4.4 4.5  CL 106 105 104  CO2 25 28 28   GLUCOSE 116* 92 105*  BUN 17 23 30*  CREATININE 0.89 0.87 1.30*  CALCIUM 8.4* 8.7* 8.9  MG  --   --  2.2  PHOS  --   --  5.1*   GFR: Estimated Creatinine Clearance: 51.8 mL/min (A) (by C-G formula based on SCr of 1.3 mg/dL (H)). Liver Function Tests: No results for input(s): AST, ALT, ALKPHOS, BILITOT, PROT, ALBUMIN in the last 168 hours. No results for input(s): LIPASE, AMYLASE in the last 168 hours. No results for input(s): AMMONIA in the last 168 hours. Coagulation Profile: No results for input(s): INR, PROTIME in the last 168 hours. Cardiac Enzymes: No results for input(s): CKTOTAL, CKMB, CKMBINDEX, TROPONINI in the last 168 hours. BNP (last 3 results)  No results for input(s): PROBNP in the last 8760 hours. HbA1C: No results for input(s): HGBA1C in the last 72 hours. CBG: Recent Labs  Lab 12/06/19 0614 12/06/19 1124 12/06/19 1750 12/06/19 2113 12/07/19 0738  GLUCAP 106* 185* 127* 80 106*   Lipid Profile: No results for input(s): CHOL, HDL, LDLCALC, TRIG, CHOLHDL, LDLDIRECT in the last 72 hours. Thyroid Function Tests: No results for input(s): TSH, T4TOTAL, FREET4, T3FREE, THYROIDAB in the last 72 hours. Anemia Panel: No results for input(s): VITAMINB12, FOLATE, FERRITIN, TIBC, IRON, RETICCTPCT in the last 72 hours. Sepsis Labs: No results for input(s): PROCALCITON, LATICACIDVEN in the last 168 hours.  Recent Results (from the past 240 hour(s))  SARS CORONAVIRUS 2 (TAT 6-24 HRS) Nasopharyngeal Nasopharyngeal Swab     Status: None   Collection Time: 11/27/19  5:18 PM   Specimen: Nasopharyngeal Swab  Result Value Ref Range Status   SARS Coronavirus 2 NEGATIVE NEGATIVE Final    Comment: (NOTE) SARS-CoV-2 target nucleic acids are NOT DETECTED. The SARS-CoV-2 RNA is generally detectable in upper and lower respiratory  specimens during the acute phase of infection. Negative results do not preclude SARS-CoV-2 infection, do not rule out co-infections with other pathogens, and should not be used as the sole basis for treatment or other patient management decisions. Negative results must be combined with clinical observations, patient history, and epidemiological information. The expected result is Negative. Fact Sheet for Patients: SugarRoll.be Fact Sheet for Healthcare Providers: https://www.woods-mathews.com/ This test is not yet approved or cleared by the [place] FDA and  has been authorized for detection and/or diagnosis of SARS-CoV-2 by FDA under an Emergency Use Authorization (EUA). This EUA will remain  in effect (meaning this test can be used) for the duration of the COVID-19 declaration under Section 56 4(b)(1) of the Act, 21 U.S.C. section 360bbb-3(b)(1), unless the authorization is terminated or revoked sooner. Performed at [facility], [place] [address] [place], [place] [address]   MRSA PCR Screening     Status: None   Collection Time: 11/28/19  7:48 PM   Specimen: Nasal Mucosa; Nasopharyngeal  Result Value Ref Range Status   MRSA by PCR NEGATIVE NEGATIVE Final    Comment:        The GeneXpert MRSA Assay (FDA approved for NASAL specimens only), is one component of a comprehensive MRSA colonization surveillance program. It is not intended to diagnose MRSA infection nor to guide or monitor treatment for MRSA infections. Performed at [facility], [place] [address], [place], [place] [address]       Radiology Studies: No results found.   LOS: 9 days   Time spent: More than 50% of that time was spent in counseling and/or coordination of care.  Antonieta Pert, MD [facility]  12/07/2019, 10:17 AM

## 2019-12-08 DIAGNOSIS — R270 Ataxia, unspecified: Secondary | ICD-10-CM | POA: Diagnosis not present

## 2019-12-08 LAB — GLUCOSE, CAPILLARY
Glucose-Capillary: 109 mg/dL — ABNORMAL HIGH (ref 70–99)
Glucose-Capillary: 117 mg/dL — ABNORMAL HIGH (ref 70–99)
Glucose-Capillary: 164 mg/dL — ABNORMAL HIGH (ref 70–99)
Glucose-Capillary: 249 mg/dL — ABNORMAL HIGH (ref 70–99)

## 2019-12-08 MED ORDER — CLONAZEPAM 0.5 MG PO TABS
0.5000 mg | ORAL_TABLET | Freq: Three times a day (TID) | ORAL | Status: DC
  Administered 2019-12-08 – 2019-12-21 (×36): 0.5 mg via ORAL
  Filled 2019-12-08 (×38): qty 1

## 2019-12-08 MED ORDER — INSULIN DETEMIR 100 UNIT/ML ~~LOC~~ SOLN
16.0000 [IU] | Freq: Every morning | SUBCUTANEOUS | Status: DC
  Administered 2019-12-09 – 2019-12-10 (×2): 16 [IU] via SUBCUTANEOUS
  Filled 2019-12-08 (×2): qty 0.16

## 2019-12-08 NOTE — Progress Notes (Signed)
Occupational Therapy Treatment Patient Details Name: Andre Rowe MRN: [id number] DOB: 1941/01/15 Today's Date: 12/08/2019    History of present illness Andre Rowe is a 79 y.o. male admitted 11/27/19 s/p x2 falls hitting head and shoulder with generalized weakness. CT C spine- multilvel degenerative changes, no fx. CT head: negative. CT shoulder negative. MRI brain no acute changes.  PMHx: schizophrenia, dementia, T2DM, HTN, HLD. Patient currently lives at [facility]   OT comments  Patient making minimal progress towards goals in skilled OT session. Patient's session encompassed progression of ADLs and functional mobility. Pt asleep upon arrival, able to orient pt and willing to participate in therapy, as therapist was taking pt through steps, pt began getting progressively more combative, threatening to harm therapist as session continued. Pt agreeable to washing off arms (covered in spit) however then refused and required total A to complete. RN made aware of status, will continue to attempt to follow acutely.    Follow Up Recommendations  SNF    Equipment Recommendations  Wheelchair (measurements OT);Wheelchair cushion (measurements OT);Hospital bed    Recommendations for Other Services      Precautions / Restrictions Precautions Precautions: Fall Precaution Comments: Pt now combative Restrictions Weight Bearing Restrictions: No       Mobility Bed Mobility Overal bed mobility: Needs Assistance Bed Mobility: Rolling Rolling: Max assist;+2 for physical assistance         General bed mobility comments: Refused to attempt to sit EOB  Transfers                      Balance Overall balance assessment: History of Falls;Needs assistance                                         ADL either performed or assessed with clinical judgement   ADL Overall ADL's : Needs assistance/impaired     Grooming: Wash/dry face;Wash/dry hands;Total  assistance Grooming Details (indicate cue type and reason): refused to assist Upper Body Bathing: Total assistance Upper Body Bathing Details (indicate cue type and reason): refused to assist, spitting on arm upon arrival         Lower Body Dressing: Total assistance Lower Body Dressing Details (indicate cue type and reason): refused to assist donning socks             Functional mobility during ADLs: +2 for physical assistance;Maximal assistance;Moderate assistance General ADL Comments: Pt increasingly combative today, was able to reorient on first attempt, however upon second attempt to sit EOB pt stated "You f with me one more time Ill blow your head off"     Vision       Perception     Praxis      Cognition Arousal/Alertness: Awake/alert Behavior During Therapy: Agitated Overall Cognitive Status: No family/caregiver present to determine baseline cognitive functioning                                 General Comments: Pt unwilling to participate in therapy at first, however with coaxing was able to reorient for a few moments, pt requires cues to recall he is in the hospital, and despite motivation to return to group home adamently refused assistance        Exercises     Shoulder Instructions       General  Comments      Pertinent Vitals/ Pain       Pain Assessment: No/denies pain  Home Living                                          Prior Functioning/Environment              Frequency  Min 2X/week        Progress Toward Goals  OT Goals(current goals can now be found in the care plan section)  Progress towards OT goals: Not progressing toward goals - comment(Combative and agitated, refusing care)  Acute Rehab OT Goals Patient Stated Goal: pt unable OT Goal Formulation: Patient unable to participate in goal setting Time For Goal Achievement: 12/13/19 Potential to Achieve Goals: Fair  Plan Discharge plan remains  appropriate    Co-evaluation                 AM-PAC OT "6 Clicks" Daily Activity     Outcome Measure   Help from another person eating meals?: A Little Help from another person taking care of personal grooming?: A Lot Help from another person toileting, which includes using toliet, bedpan, or urinal?: Total Help from another person bathing (including washing, rinsing, drying)?: Total Help from another person to put on and taking off regular upper body clothing?: Total Help from another person to put on and taking off regular lower body clothing?: Total 6 Click Score: 9    End of Session    OT Visit Diagnosis: Unsteadiness on feet (R26.81);Muscle weakness (generalized) (M62.81)   Activity Tolerance Treatment limited secondary to agitation;Patient limited by fatigue   Patient Left in bed;with bed alarm set;with call bell/phone within reach   Nurse Communication Mobility status;Precautions(Pt now combative, RN made aware)        Time: 1118-1130 OT Time Calculation (min): 12 min  Charges: OT General Charges $OT Visit: 1 Visit OT Treatments $Self Care/Home Management : 8-22 mins  Pollyann Glen E. Zeric Baranowski, COTA/L Acute Rehabilitation Services [phone]   Cherlyn Cushing 12/08/2019, 12:02 PM

## 2019-12-08 NOTE — Progress Notes (Signed)
PROGRESS NOTE    Andre Rowe  XTG:[id number] DOB: [birth year] DOA: 11/27/2019 PCP: Maryruth Hancock, MD   Brief Narrative:  79 y.o.malewith a history of history of schizophrenia, dementia, type 2 diabetes, hypertension, hyperlipidemia. Patient currently lives at [facility]. As the patient has dementia and schizophrenia, the patient is unable to give history. History is obtained by caregiver. Patient has been having a gradual decline over the past week, but a more sudden decline over the past 12 hours. Since today, the patient has needed a fair amount assistance in walking as he is unable to balance. This is a new change. The patient had 2 falls earlier today: The first in the shower, then on the porch while smoking. Patient hit his head and left shoulder on the second fall. He was brought to the hospital for evaluation. No seeming palliating or provoking factors. No fevers, chills, nausea, vomiting, decreased appetite, slurred speech, weakness.  Patient does appear to have fairly significant dementia and does not have insight or judgment. Per the patient's caregiver, the patient has no meaningful relationship and does not have a power of attorney or guardian.  Patient treated for ataxia/ambulatory dysfunction,dementia schizophrenia diabetes hypertension.  Group home refused to take him back, has no guardian and at this time looking into long-term placement  Subjective:  Patient not participating in care.  Overnight hypotensive.  No nausea no vomiting.  Denies any acute complaint.  Assessment & Plan:  Ataxia/ambulatory dysfunction: Mri, CT head, CT C-spine brain no acute finding, showed generalized age-related cerebral atrophy with chronic small vessel ischemic disease.B12 TSH normal A1c thiamine folate normal, continue PT OT.  Supportive care.  Awaiting SNF  Episode of decreased level of consciousness 4/17 has since resolved.  CT head was unremarkable.  He is alert awake  oriented.  Controlled type 2 diabetes mellitus with complication, with long-term current use of insulin: hba1c is 7.6 Continue current insulin regimen and monitor blood sugar.  Hold oral meds. Recent Labs  Lab 12/07/19 2119 12/07/19 2254 12/08/19 0611 12/08/19 1150 12/08/19 1652  GLUCAP 68* 265* 109* 117* 164*   Lab Results  Component Value Date   HGBA1C 7.6 (H) 11/28/2019   Schizophrenia: Speech with lipsmacking continue home meds with Tegretol Klonopin and Zyprexa increase Klonopin dose to 3 times daily.  Essential hypertension: Blood pressure is controlled.  Continue lisinopril.  History of subdural hematoma-stable CT head  Unspecified dementia with behavioral disturbance -continue supportive care  DVT prophylaxis:lovenox Code Status: full  Family Communication: plan of care discussed with patient at bedside.  Status is: Inpatient Remains inpatient appropriate because:Unsafe d/c plan  Dispo: The patient is from: Group home              Anticipated d/c is to: SNF              Anticipated d/c date is: 1 day              Patient currently is medically stable to d/c. COVID ordered for SNF. Nutrition: Diet Order            Diet Carb Modified Fluid consistency: Thin; Room service appropriate? Yes  Diet effective now             Body mass index is 33.41 kg/m.  Consultants: none Procedures:see note Microbiology:see note  Medications: Scheduled Meds: . carbamazepine  200 mg Oral TID  . clonazePAM  0.5 mg Oral TID  . donepezil  10 mg Oral QHS  . enoxaparin (  LOVENOX) injection  40 mg Subcutaneous Daily  . insulin aspart  0-6 Units Subcutaneous TID WC  . [START ON 12/09/2019] insulin detemir  16 Units Subcutaneous q morning - 10a  . latanoprost  1 drop Both Eyes QHS  . memantine  10 mg Oral BID  . OLANZapine  10 mg Oral QHS  . oxybutynin  5 mg Oral BID  . simvastatin  40 mg Oral Daily  . tamsulosin  0.4 mg Oral Daily  . thiamine  100 mg Oral Daily   Continuous  Infusions:  Antimicrobials: Anti-infectives (From admission, onward)   None       Objective: Vitals: Today's Vitals   12/08/19 0958 12/08/19 0959 12/08/19 1156 12/08/19 1607  BP: 102/60 (!) 100/56 102/63 (!) 125/92  Pulse:   76 98  Resp:   16 20  Temp:    98.6 F (37 C)  TempSrc:      SpO2:    93%  Weight:      Height:      PainSc:        Intake/Output Summary (Last 24 hours) at 12/08/2019 2045 Last data filed at 12/08/2019 1000 Gross per 24 hour  Intake --  Output 1700 ml  Net -1700 ml   Filed Weights   11/27/19 1443  Weight: 93.9 kg   Weight change:    Intake/Output from previous day: No intake/output data recorded. Intake/Output this shift: No intake/output data recorded.  Examination:  General exam: AAO, conversant, difficulty with complaints and with baseline speech/psych issues, NAD, weak appearing. HEENT:Oral mucosa moist, Ear/Nose WNL grossly,dentition normal. Respiratory system: bilaterally clear,no wheezing or crackles,no use of accessory muscle, non tender. Cardiovascular system: S1 & S2 +, regular, No JVD. Gastrointestinal system: Abdomen soft, NT,ND, BS+. Nervous System:Alert, awake, follows some commands, speaking with lipsmacking, moving extremities and grossly nonfocal Extremities: No edema, distal peripheral pulses palpable.  Skin: No rashes,no icterus. MSK: Normal muscle bulk,tone, power  Data Reviewed: I have personally reviewed following labs and imaging studies CBC: Recent Labs  Lab 12/03/19 0448 12/06/19 0431  WBC 5.5 4.9  HGB 11.9* 11.5*  HCT 37.4* 36.2*  MCV 109.0* 110.4*  PLT 207 250   Basic Metabolic Panel: Recent Labs  Lab 12/03/19 0448 12/06/19 0431  NA 141 142  K 4.4 4.5  CL 105 104  CO2 28 28  GLUCOSE 92 105*  BUN 23 30*  CREATININE 0.87 1.30*  CALCIUM 8.7* 8.9  MG  --  2.2  PHOS  --  5.1*   GFR: Estimated Creatinine Clearance: 51.8 mL/min (A) (by C-G formula based on SCr of 1.3 mg/dL (H)). Liver Function  Tests: No results for input(s): AST, ALT, ALKPHOS, BILITOT, PROT, ALBUMIN in the last 168 hours. No results for input(s): LIPASE, AMYLASE in the last 168 hours. No results for input(s): AMMONIA in the last 168 hours. Coagulation Profile: No results for input(s): INR, PROTIME in the last 168 hours. Cardiac Enzymes: No results for input(s): CKTOTAL, CKMB, CKMBINDEX, TROPONINI in the last 168 hours. BNP (last 3 results) No results for input(s): PROBNP in the last 8760 hours. HbA1C: No results for input(s): HGBA1C in the last 72 hours. CBG: Recent Labs  Lab 12/07/19 2119 12/07/19 2254 12/08/19 0611 12/08/19 1150 12/08/19 1652  GLUCAP 68* 265* 109* 117* 164*   Lipid Profile: No results for input(s): CHOL, HDL, LDLCALC, TRIG, CHOLHDL, LDLDIRECT in the last 72 hours. Thyroid Function Tests: No results for input(s): TSH, T4TOTAL, FREET4, T3FREE, THYROIDAB in the last 72 hours.  Anemia Panel: No results for input(s): VITAMINB12, FOLATE, FERRITIN, TIBC, IRON, RETICCTPCT in the last 72 hours. Sepsis Labs: No results for input(s): PROCALCITON, LATICACIDVEN in the last 168 hours.  Recent Results (from the past 240 hour(s))  SARS CORONAVIRUS 2 (TAT 6-24 HRS) Nasopharyngeal Nasopharyngeal Swab     Status: None   Collection Time: 12/07/19  2:45 PM   Specimen: Nasopharyngeal Swab  Result Value Ref Range Status   SARS Coronavirus 2 NEGATIVE NEGATIVE Final    Comment: (NOTE) SARS-CoV-2 target nucleic acids are NOT DETECTED. The SARS-CoV-2 RNA is generally detectable in upper and lower respiratory specimens during the acute phase of infection. Negative results do not preclude SARS-CoV-2 infection, do not rule out co-infections with other pathogens, and should not be used as the sole basis for treatment or other patient management decisions. Negative results must be combined with clinical observations, patient history, and epidemiological information. The expected result is Negative. Fact  Sheet for Patients: HairSlick.no Fact Sheet for Healthcare Providers: quierodirigir.com This test is not yet approved or cleared by the [place] FDA and  has been authorized for detection and/or diagnosis of SARS-CoV-2 by FDA under an Emergency Use Authorization (EUA). This EUA will remain  in effect (meaning this test can be used) for the duration of the COVID-19 declaration under Section 56 4(b)(1) of the Act, 21 U.S.C. section 360bbb-3(b)(1), unless the authorization is terminated or revoked sooner. Performed at [facility], [address], [place], [place] [address]       Radiology Studies: No results found.   LOS: 10 days   Time spent: More than 50% of that time was spent in counseling and/or coordination of care.  Lynden [place], MD [facility]  12/08/2019, 8:45 PM

## 2019-12-08 NOTE — Progress Notes (Signed)
Patient refused CPAP for the night  

## 2019-12-09 LAB — CBC WITH DIFFERENTIAL/PLATELET
Abs Immature Granulocytes: 0.03 10*3/uL (ref 0.00–0.07)
Basophils Absolute: 0 10*3/uL (ref 0.0–0.1)
Basophils Relative: 0 %
Eosinophils Absolute: 0.1 10*3/uL (ref 0.0–0.5)
Eosinophils Relative: 1 %
HCT: 38.6 % — ABNORMAL LOW (ref 39.0–52.0)
Hemoglobin: 12.4 g/dL — ABNORMAL LOW (ref 13.0–17.0)
Immature Granulocytes: 1 %
Lymphocytes Relative: 53 %
Lymphs Abs: 2.6 10*3/uL (ref 0.7–4.0)
MCH: 34.5 pg — ABNORMAL HIGH (ref 26.0–34.0)
MCHC: 32.1 g/dL (ref 30.0–36.0)
MCV: 107.5 fL — ABNORMAL HIGH (ref 80.0–100.0)
Monocytes Absolute: 0.6 10*3/uL (ref 0.1–1.0)
Monocytes Relative: 13 %
Neutro Abs: 1.6 10*3/uL — ABNORMAL LOW (ref 1.7–7.7)
Neutrophils Relative %: 32 %
Platelets: 273 10*3/uL (ref 150–400)
RBC: 3.59 MIL/uL — ABNORMAL LOW (ref 4.22–5.81)
RDW: 14.1 % (ref 11.5–15.5)
WBC: 4.9 10*3/uL (ref 4.0–10.5)
nRBC: 0 % (ref 0.0–0.2)

## 2019-12-09 LAB — GLUCOSE, CAPILLARY
Glucose-Capillary: 118 mg/dL — ABNORMAL HIGH (ref 70–99)
Glucose-Capillary: 175 mg/dL — ABNORMAL HIGH (ref 70–99)
Glucose-Capillary: 283 mg/dL — ABNORMAL HIGH (ref 70–99)
Glucose-Capillary: 146 mg/dL — ABNORMAL HIGH (ref 70–99)

## 2019-12-09 LAB — COMPREHENSIVE METABOLIC PANEL
ALT: 13 U/L (ref 0–44)
AST: 13 U/L — ABNORMAL LOW (ref 15–41)
Albumin: 3 g/dL — ABNORMAL LOW (ref 3.5–5.0)
Alkaline Phosphatase: 99 U/L (ref 38–126)
Anion gap: 8 (ref 5–15)
BUN: 23 mg/dL (ref 8–23)
CO2: 27 mmol/L (ref 22–32)
Calcium: 8.9 mg/dL (ref 8.9–10.3)
Chloride: 106 mmol/L (ref 98–111)
Creatinine, Ser: 1.12 mg/dL (ref 0.61–1.24)
GFR calc Af Amer: >60 mL/min (ref >60)
GFR calc non Af Amer: >60 mL/min (ref >60)
Glucose, Bld: 127 mg/dL — ABNORMAL HIGH (ref 70–99)
Potassium: 4.4 mmol/L (ref 3.5–5.1)
Sodium: 141 mmol/L (ref 135–145)
Total Bilirubin: 0.5 mg/dL (ref 0.3–1.2)
Total Protein: 6.3 g/dL — ABNORMAL LOW (ref 6.5–8.1)

## 2019-12-09 LAB — LACTIC ACID, PLASMA: Lactic Acid, Venous: 0.9 mmol/L (ref 0.5–1.9)

## 2019-12-09 LAB — MAGNESIUM: Magnesium: 2 mg/dL (ref 1.7–2.4)

## 2019-12-09 MED ORDER — ENSURE ENLIVE PO LIQD
237.0000 mL | Freq: Three times a day (TID) | ORAL | Status: DC
  Administered 2019-12-09 – 2019-12-21 (×34): 237 mL via ORAL

## 2019-12-09 MED ORDER — CLONAZEPAM 0.5 MG PO TABS: 0.5000 mg | ORAL_TABLET | Freq: Two times a day (BID) | ORAL | 0 refills | Status: AC

## 2019-12-09 MED ORDER — INSULIN DETEMIR 100 UNIT/ML ~~LOC~~ SOLN: 16.0000 [IU] | Freq: Every morning | SUBCUTANEOUS | 11 refills | Status: AC

## 2019-12-09 MED ORDER — BIMATOPROST 0.01 % OP SOLN: 1.0000 [drp] | Freq: Every day | OPHTHALMIC | 0 refills | Status: DC

## 2019-12-09 MED ORDER — THIAMINE HCL 100 MG PO TABS: 100.0000 mg | ORAL_TABLET | Freq: Every day | ORAL | 0 refills | Status: AC

## 2019-12-09 MED ORDER — ADULT MULTIVITAMIN W/MINERALS CH
1.0000 | ORAL_TABLET | Freq: Every day | ORAL | Status: DC
  Administered 2019-12-09 – 2019-12-21 (×12): 1 via ORAL
  Filled 2019-12-09 (×13): qty 1

## 2019-12-09 NOTE — Progress Notes (Signed)
Physical Therapy Treatment Patient Details Name: Andre Rowe MRN: [id number] DOB: Mar 18, 1941 Today's Date: 12/09/2019    History of Present Illness Willam Munford is a 79 y.o. male admitted 11/27/19 s/p x2 falls hitting head and shoulder with generalized weakness. CT C spine- multilvel degenerative changes, no fx. CT head: negative. CT shoulder negative. MRI brain no acute changes.  PMHx: schizophrenia, dementia, T2DM, HTN, HLD. Patient currently lives at [facility] family care    PT Comments    Patient seen for mobility progression. Pt requires +2 assist for OOB mobility. Continue to progress as tolerated with anticipated d/c to SNF for further skilled PT services.     Follow Up Recommendations  SNF;Supervision/Assistance - 24 hour     Equipment Recommendations  Other (comment)(TBD next venue)    Recommendations for Other Services       Precautions / Restrictions Precautions Precautions: Fall Restrictions Weight Bearing Restrictions: No    Mobility  Bed Mobility Overal bed mobility: Needs Assistance Bed Mobility: Supine to Sit     Supine to sit: HOB elevated;Mod assist;+2 for physical assistance     General bed mobility comments: cues and assistance to bring bilat LE to EOB and to elevate trunk into sitting   Transfers Overall transfer level: Needs assistance Equipment used: 2 person hand held assist;Rolling walker (2 wheeled) Transfers: Sit to/from [employer] Sit to Stand: Mod assist;+2 physical assistance;+2 safety/equipment Stand pivot transfers: +2 physical assistance;Mod assist;+2 safety/equipment       General transfer comment: assist to power up into stanidng and cues for upright posture upon standing; cues for safe hand placement; pt stood from EOB, stand pivot to recliner, and then stood from recliner for hygiene  Ambulation/Gait                 Stairs             Wheelchair Mobility    Modified Rankin (Stroke  Patients Only)       Balance Overall balance assessment: History of Falls;Needs assistance Sitting-balance support: Feet supported Sitting balance-Leahy Scale: Fair     Standing balance support: Bilateral upper extremity supported Standing balance-Leahy Scale: Poor                              Cognition Arousal/Alertness: Awake/alert Behavior During Therapy: Flat affect Overall Cognitive Status: No family/caregiver present to determine baseline cognitive functioning                                 General Comments: pt slow to process and speaking to therapist but keeping eyes closed until sitting EOB; unintelligible speech at times      Exercises      General Comments        Pertinent Vitals/Pain Pain Assessment: Faces Faces Pain Scale: No hurt    Home Living                      Prior Function            PT Goals (current goals can now be found in the care plan section) Progress towards PT goals: Progressing toward goals    Frequency    Min 2X/week      PT Plan Current plan remains appropriate    Co-evaluation              AM-PAC PT "6  Clicks" Mobility   Outcome Measure  Help needed turning from your back to your side while in a flat bed without using bedrails?: A Lot Help needed moving from lying on your back to sitting on the side of a flat bed without using bedrails?: A Lot Help needed moving to and from a bed to a chair (including a wheelchair)?: A Lot Help needed standing up from a chair using your arms (e.g., wheelchair or bedside chair)?: A Lot Help needed to walk in hospital room?: A Lot Help needed climbing 3-5 steps with a railing? : Total 6 Click Score: 11    End of Session Equipment Utilized During Treatment: Gait belt Activity Tolerance: Patient tolerated treatment well Patient left: in chair;with chair alarm set;with call bell/phone within reach Nurse Communication: Mobility status PT Visit  Diagnosis: Unsteadiness on feet (R26.81);Other symptoms and signs involving the nervous system (R29.898);Difficulty in walking, not elsewhere classified (R26.2);Muscle weakness (generalized) (M62.81)     Time: 4270-6237 PT Time Calculation (min) (ACUTE ONLY): 24 min  Charges:  $Gait Training: 23-37 mins                     Earney Navy, PTA Acute Rehabilitation Services Pager: [phone] Office: [phone]     Darliss Cheney 12/09/2019, 5:04 PM

## 2019-12-09 NOTE — Progress Notes (Signed)
Initial Nutrition Assessment  DOCUMENTATION CODES:   Not applicable  INTERVENTION:  Ensure Enlive po TID, each supplement provides 350 kcal and 20 grams of protein  MVI daily  NUTRITION DIAGNOSIS:   Inadequate oral intake related to poor appetite as evidenced by meal completion < 50%.    GOAL:   Patient will meet greater than or equal to 90% of their needs    MONITOR:   PO intake, Supplement acceptance, Labs, Weight trends, I & O's  REASON FOR ASSESSMENT:   LOS    ASSESSMENT:   Pt with a PMH significant for schizophrenia, dementia, T2DM, HTN, and HLD who presented from group home s/p fall.   Pt noted to have been declining in the week prior to admission with a sudden decline over the 12-hour period before admission.   Per MD, pt not participating in care.   Pt sleeping at time of RD visit.   PO Intake: 5-100% x last 8 recorded meals (50% average intake)  UOP: 1,761ml x24 hours I/O: -7,894.69ml since admit  Labs reviewed: CBGs [phone] Medications reviewed and include: Novolog, Levemir, Thiamine   NUTRITION - FOCUSED PHYSICAL EXAM:  Deferred; RD will attempt at follow-up.  Diet Order:   Diet Order            Diet - low sodium heart healthy        Diet Carb Modified Fluid consistency: Thin; Room service appropriate? Yes  Diet effective now              EDUCATION NEEDS:   Not appropriate for education at this time  Skin:  Skin Assessment: Reviewed RN Assessment  Last BM:  4/21 type 5  Height:   Ht Readings from Last 1 Encounters:  11/27/19 5\' 6"  (1.676 m)    Weight:   Wt Readings from Last 1 Encounters:  11/27/19 93.9 kg    BMI:  Body mass index is 33.41 kg/m.  Estimated Nutritional Needs:   Kcal:  1800-2000  Protein:  110-125 grams  Fluid:  >/= 1.8L   01/27/20, MS, RD, LDN RD pager number and weekend/on-call pager number located in Amion.

## 2019-12-09 NOTE — Progress Notes (Signed)
Patient refuses to wear CPAP, states he does not like the mask. RT offered to make adjustments and pt refused.

## 2019-12-09 NOTE — Discharge Summary (Signed)
[facility] Discharge Summary   Patient: Andre Rowe QIH:[id number]  PCP: Wandra Feinstein, MD  Date of admission: 11/27/2019   Date of discharge:  12/09/2019     Discharge Diagnoses:  Principal Problem:   Ataxia Active Problems:   Controlled type 2 diabetes mellitus with complication, with long-term current use of insulin (HCC)   Schizophrenia (HCC)   Essential hypertension   History of subdural hematoma   Unspecified dementia with behavioral disturbance (HCC)   Hyperlipidemia associated with type 2 diabetes mellitus (HCC)   Altered mental status   Admitted From: group home Disposition:  SNF   Recommendations for Outpatient Follow-up:  1. PCP: Follow-up with PCP in 1 week 2. Follow up LABS/TEST: None   Contact information for follow-up providers    Corum, Minerva Fester, MD. Schedule an appointment as soon as possible for a visit in 1 week(s).   Specialty: Family Medicine Contact information: [address] [place] [address] [phone]            Contact information for after-discharge care    Destination    [facility] .   Service: Skilled Nursing Contact information: [address] [place] [address] [phone]                 Diet recommendation: Cardiac diet  Activity: The patient is advised to gradually reintroduce usual activities, as tolerated  Discharge Condition: stable  Code Status: Full code   History of present illness: As per the H and P dictated on admission, "Andre Rowe is a 79 y.o. male with a history of history of schizophrenia, dementia, type 2 diabetes, hypertension, hyperlipidemia.  Patient currently lives at [facility].  As the patient has dementia and schizophrenia, the patient is unable to give history.  History is obtained by caregiver.  Patient has been having a gradual decline over the past week, but a more sudden decline over the past 12 hours.  Since today, the patient has needed  a fair amount assistance in walking as he is unable to balance.  This is a new change.  The patient had 2 falls earlier today: The first in the shower, then on the porch while smoking.  Patient hit his head and left shoulder on the second fall.  He was brought to the hospital for evaluation.  No seeming palliating or provoking factors.  No fevers, chills, nausea, vomiting, decreased appetite, slurred speech, weakness.  Patient does appear to have fairly significant dementia and does not have insight or judgment.  Per the patient's caregiver, the patient has no meaningful relationship and does not have a power of attorney or guardian."  Hospital Course:  Summary of his active problems in the hospital is as following. Ataxia. Ambulatory dysfunction. Recurrent fall. Patient is from a group home was brought to the hospital due to acute decline on top of patient's progressive decline. Patient had 2 falls during the day on day of admission. MRI brain was performed which was negative for any acute stroke or abnormality. PT OT was consulted. Patient after initial refusal is agreeable to work with therapy currently requiring SNF level of care to improve mobility enough that he can go back to group home.  Schizophrenia. On Tegretol Klonopin and Zyprexa at home. Continue home regimen for now.  Essential hypertension. Blood pressure stable. Continue current regimen.  History of subdural hematoma. CT scan unremarkable.  Unspecified dementia with behavioral disturbance continue supportive care  Type 2 diabetes mellitus  without complication. Controlled. Hemoglobin A1c 7.6. Continue current insulin regimen and Metformin.  Body mass index is 33.41 kg/m.   Patient was seen by physical therapy, who recommended SNF, which was arranged. On the day of the discharge the patient's vitals were stable, and no other acute medical condition were reported by patient. the patient was felt safe to be  discharge at SNF with therapy.  Consultants: none Procedures: none  Discharge Exam: General: Appear in no distress, no Rash; Oral Mucosa Clear, moist. Cardiovascular: S1 and S2 Present, no Murmur, Respiratory: normal respiratory effort, Bilateral Air entry present and no Crackles, no wheezes Abdomen: Bowel Sound present, Soft and no tenderness, no hernia Extremities: no Pedal edema, no calf tenderness Neurology: alert and oriented to time, place, and person affect appropriate.  Filed Weights   11/27/19 1443  Weight: 93.9 kg   Vitals:   12/09/19 0348 12/09/19 0824  BP: 106/72 131/63  Pulse: 83 87  Resp: 18 19  Temp: 97.6 F (36.4 C) 97.7 F (36.5 C)  SpO2: 100% 98%    DISCHARGE MEDICATION: Allergies as of 12/09/2019   No Known Allergies     Medication List    TAKE these medications   acetaminophen 325 MG tablet Commonly known as: TYLENOL Take 650 mg by mouth every 6 (six) hours as needed for mild pain or moderate pain.   bimatoprost 0.01 % Soln Commonly known as: LUMIGAN Place 1 drop into both eyes at bedtime.   carbamazepine 200 MG tablet Commonly known as: TEGRETOL Take 200 mg by mouth 3 (three) times daily.   clonazePAM 0.5 MG tablet Commonly known as: KLONOPIN Take 1 tablet (0.5 mg total) by mouth 2 (two) times daily.   donepezil 10 MG tablet Commonly known as: ARICEPT Take 10 mg by mouth at bedtime.   folic acid 1 MG tablet Commonly known as: FOLVITE Take 1 mg by mouth daily.   insulin detemir 100 UNIT/ML injection Commonly known as: Levemir Inject 0.16 mLs (16 Units total) into the skin every morning. What changed: how much to take   ketoconazole 2 % cream Commonly known as: NIZORAL Apply 1 application topically 2 (two) times daily as needed for irritation.   latanoprost 0.005 % ophthalmic solution Commonly known as: XALATAN Place 1 drop into both eyes at bedtime.   memantine 10 MG tablet Commonly known as: NAMENDA Take 10 mg by mouth 2  (two) times daily.   metFORMIN 1000 MG tablet Commonly known as: GLUCOPHAGE TAKE 1 TABLET BY MOUTH TWICE DAILY. What changed: when to take this   OLANZapine 10 MG tablet Commonly known as: ZYPREXA Take 10 mg by mouth at bedtime.   omeprazole 20 MG capsule Commonly known as: PRILOSEC Take 20 mg by mouth daily.   oxybutynin 5 MG tablet Commonly known as: DITROPAN Take 5 mg by mouth 2 (two) times daily.   polyethylene glycol powder 17 GM/SCOOP powder Commonly known as: GLYCOLAX/MIRALAX Take 17 g by mouth daily. Mixed with 8 ounces of water or juice and drink once daily   senna-docusate 8.6-50 MG tablet Commonly known as: Senokot-S Take 1 tablet by mouth at bedtime.   simvastatin 40 MG tablet Commonly known as: ZOCOR Take 40 mg by mouth daily.   tamsulosin 0.4 MG Caps capsule Commonly known as: FLOMAX Take 0.4 mg by mouth daily.   thiamine 100 MG tablet Take 1 tablet (100 mg total) by mouth daily. Start taking on: December 10, 2019   triamcinolone cream 0.5 % Commonly known as:  KENALOG Apply 1 application topically 2 (two) times daily as needed (applied between toes).   Vitamin D (Ergocalciferol) 1.25 MG (50000 UNIT) Caps capsule Commonly known as: DRISDOL TAKE 1 TABLET BY MOUTH ONCE WEEKLY. What changed: when to take this      No Known Allergies Discharge Instructions    Diet - low sodium heart healthy   Complete by: As directed    Increase activity slowly   Complete by: As directed       The results of significant diagnostics from this hospitalization (including imaging, microbiology, ancillary and laboratory) are listed below for reference.    Significant Diagnostic Studies: DG Chest 2 View  Result Date: 11/27/2019 CLINICAL DATA:  Fall, dementia EXAM: CHEST - 2 VIEW COMPARISON:  04/22/2017 FINDINGS: The heart size and mediastinal contours are within normal limits. Low lung volumes with streaky bibasilar opacities. No pleural effusion or pneumothorax. No  acute osseous finding. IMPRESSION: Low lung volumes with streaky bibasilar opacities, likely atelectasis. Electronically Signed   By: Duanne Guess D.O.   On: 11/27/2019 15:45   DG Abd 1 View  Result Date: 11/27/2019 CLINICAL DATA:  79 year old male with abdominal pain. EXAM: ABDOMEN - 1 VIEW COMPARISON:  CT abdomen pelvis dated 12/25/2013. FINDINGS: Evaluation is limited due to body habitus and soft tissue attenuation. There is eventration of the left hemidiaphragm. There is moderate stool throughout the colon. There is no bowel dilatation or evidence of obstruction. No free air or obvious radiopaque calculi. The osseous structures and soft tissues are grossly unremarkable. IMPRESSION: Constipation. No bowel obstruction. Electronically Signed   By: Elgie Collard M.D.   On: 11/27/2019 23:59   CT Head Wo Contrast  Result Date: 11/27/2019 CLINICAL DATA:  Head trauma EXAM: CT HEAD WITHOUT CONTRAST CT CERVICAL SPINE WITHOUT CONTRAST TECHNIQUE: Multidetector CT imaging of the head and cervical spine was performed following the standard protocol without intravenous contrast. Multiplanar CT image reconstructions of the cervical spine were also generated. COMPARISON:  None. 06/09/2017 FINDINGS: CT HEAD FINDINGS Brain: No evidence of acute infarction, hemorrhage, hydrocephalus, extra-axial collection or mass lesion/mass effect. Signs of atrophy and chronic microvascular ischemic change are similar to the prior study. Vascular: No hyperdense vessel or unexpected calcification. Skull: Normal. Negative for fracture or focal lesion. Sinuses/Orbits: Visualized paranasal sinuses and orbits are unremarkable. Other: Lenticular areas of low density about the left orbit, 1 likely within the left eyelid measuring 18 x 11 mm. This was present on the prior exam measuring approximately 14 by 8 mm. This is well-circumscribed and likely amenable to direct clinical inspection. CT CERVICAL SPINE FINDINGS Alignment: Reversal of  normal cervical lordosis in the upper cervical spine in the setting of degenerative change. Skull base and vertebrae: No acute fracture. No primary bone lesion or focal pathologic process. Soft tissues and spinal canal: No prevertebral fluid or swelling. No visible canal hematoma. Disc levels: Multilevel spinal degenerative change is similar to the prior study most pronounced at C3-4, C4-5 and C5-6. Also at C6-7. Uncovertebral spurring is noted at multiple levels along with facet hypertrophy. Facet hypertrophy is greatest on the right at C2-3. Upper chest: Emphysematous changes at the lung apices. Other: None IMPRESSION: 1. No acute intracranial abnormality. 2. Signs of atrophy and chronic microvascular ischemic change. 3. Lenticular areas of low density about the left orbit, 1 likely within the left eyelid measuring 18 x 11 mm. This was present on the prior exam measuring approximately 14 by 8 mm. This is well-circumscribed and likely amenable to  direct clinical inspection. These may represent sebaceous cysts. 4. Multilevel spinal degenerative changes without acute fracture. Electronically Signed   By: Zetta Bills M.D.   On: 11/27/2019 16:33   CT Cervical Spine Wo Contrast  Result Date: 11/27/2019 CLINICAL DATA:  Head trauma EXAM: CT HEAD WITHOUT CONTRAST CT CERVICAL SPINE WITHOUT CONTRAST TECHNIQUE: Multidetector CT imaging of the head and cervical spine was performed following the standard protocol without intravenous contrast. Multiplanar CT image reconstructions of the cervical spine were also generated. COMPARISON:  None. 06/09/2017 FINDINGS: CT HEAD FINDINGS Brain: No evidence of acute infarction, hemorrhage, hydrocephalus, extra-axial collection or mass lesion/mass effect. Signs of atrophy and chronic microvascular ischemic change are similar to the prior study. Vascular: No hyperdense vessel or unexpected calcification. Skull: Normal. Negative for fracture or focal lesion. Sinuses/Orbits: Visualized  paranasal sinuses and orbits are unremarkable. Other: Lenticular areas of low density about the left orbit, 1 likely within the left eyelid measuring 18 x 11 mm. This was present on the prior exam measuring approximately 14 by 8 mm. This is well-circumscribed and likely amenable to direct clinical inspection. CT CERVICAL SPINE FINDINGS Alignment: Reversal of normal cervical lordosis in the upper cervical spine in the setting of degenerative change. Skull base and vertebrae: No acute fracture. No primary bone lesion or focal pathologic process. Soft tissues and spinal canal: No prevertebral fluid or swelling. No visible canal hematoma. Disc levels: Multilevel spinal degenerative change is similar to the prior study most pronounced at C3-4, C4-5 and C5-6. Also at C6-7. Uncovertebral spurring is noted at multiple levels along with facet hypertrophy. Facet hypertrophy is greatest on the right at C2-3. Upper chest: Emphysematous changes at the lung apices. Other: None IMPRESSION: 1. No acute intracranial abnormality. 2. Signs of atrophy and chronic microvascular ischemic change. 3. Lenticular areas of low density about the left orbit, 1 likely within the left eyelid measuring 18 x 11 mm. This was present on the prior exam measuring approximately 14 by 8 mm. This is well-circumscribed and likely amenable to direct clinical inspection. These may represent sebaceous cysts. 4. Multilevel spinal degenerative changes without acute fracture. Electronically Signed   By: Zetta Bills M.D.   On: 11/27/2019 16:33   MR BRAIN WO CONTRAST  Result Date: 11/28/2019 CLINICAL DATA:  Initial evaluation for acute ataxia, stroke suspected. EXAM: MRI HEAD WITHOUT CONTRAST TECHNIQUE: Multiplanar, multiecho pulse sequences of the brain and surrounding structures were obtained without intravenous contrast. COMPARISON:  Prior head CT from 11/27/2019. FINDINGS: Brain: Examination moderately degraded by motion artifact. Generalized  age-related cerebral atrophy. Patchy T2/FLAIR hyperintensity within the periventricular white matter and pons, most consistent with chronic small vessel ischemic disease, mild in nature. No abnormal foci of restricted diffusion to suggest acute or subacute ischemia. Gray-white matter differentiation maintained. No encephalomalacia to suggest chronic cortical infarction. No evidence for acute or chronic intracranial hemorrhage. No mass lesion, midline shift or mass effect. Diffuse ventricular prominence related to global parenchymal volume loss of hydrocephalus. No extra-axial fluid collection. Pituitary gland grossly normal. Midline structures intact. Vascular: Major intracranial vascular flow voids are maintained. Skull and upper cervical spine: Craniocervical junction within normal limits. Bone marrow signal intensity normal. No scalp soft tissue abnormality. Sinuses/Orbits: Globes orbital soft tissues demonstrate no acute finding. Remote posttraumatic defect noted at the right lamina papyracea. Mild chronic mucoperiosteal thickening noted throughout the paranasal sinuses. Small right mastoid effusion, of doubtful significance. Other: Few probable sebaceous cysts noted involving the left periorbital soft tissues, largest of which measures 18  mm. 9 mm cystic lesion noted at the central aspect of the alveolar ridge, nonspecific, but of doubtful clinical significance (series 13, image 1). IMPRESSION: 1. No acute intracranial abnormality. 2. Generalized age-related cerebral atrophy with mild chronic small vessel ischemic disease. Electronically Signed   By: Rise Mu M.D.   On: 11/28/2019 02:59   DG Shoulder Left  Result Date: 11/27/2019 CLINICAL DATA:  Left shoulder pain after fall EXAM: LEFT SHOULDER - 2+ VIEW COMPARISON:  Chest x-ray 04/22/2017 FINDINGS: There is no evidence of fracture or dislocation. Mild degenerative changes. Small mineralized density inferior to the glenoid is unchanged from  prior x-ray 04/22/2017, and may reflect a small intra-articular loose body. Soft tissues are unremarkable. Streaky left basilar opacity. IMPRESSION: 1. No acute osseous abnormality. 2. Mild degenerative changes of the left shoulder. 3. Streaky left basilar opacity. Electronically Signed   By: Duanne Guess D.O.   On: 11/27/2019 15:47    Microbiology: Recent Results (from the past 240 hour(s))  SARS CORONAVIRUS 2 (TAT 6-24 HRS) Nasopharyngeal Nasopharyngeal Swab     Status: None   Collection Time: 12/07/19  2:45 PM   Specimen: Nasopharyngeal Swab  Result Value Ref Range Status   SARS Coronavirus 2 NEGATIVE NEGATIVE Final    Comment: (NOTE) SARS-CoV-2 target nucleic acids are NOT DETECTED. The SARS-CoV-2 RNA is generally detectable in upper and lower respiratory specimens during the acute phase of infection. Negative results do not preclude SARS-CoV-2 infection, do not rule out co-infections with other pathogens, and should not be used as the sole basis for treatment or other patient management decisions. Negative results must be combined with clinical observations, patient history, and epidemiological information. The expected result is Negative. Fact Sheet for Patients: HairSlick.no Fact Sheet for Healthcare Providers: quierodirigir.com This test is not yet approved or cleared by the [place] FDA and  has been authorized for detection and/or diagnosis of SARS-CoV-2 by FDA under an Emergency Use Authorization (EUA). This EUA will remain  in effect (meaning this test can be used) for the duration of the COVID-19 declaration under Section 56 4(b)(1) of the Act, 21 U.S.C. section 360bbb-3(b)(1), unless the authorization is terminated or revoked sooner. Performed at [facility], [address], [place], [place] [address]      Labs: CBC: Recent Labs  Lab 12/03/19 0448 12/06/19 0431 12/09/19 0559  WBC 5.5 4.9 4.9    NEUTROABS  --   --  1.6*  HGB 11.9* 11.5* 12.4*  HCT 37.4* 36.2* 38.6*  MCV 109.0* 110.4* 107.5*  PLT 207 250 273   Basic Metabolic Panel: Recent Labs  Lab 12/03/19 0448 12/06/19 0431 12/09/19 0559  NA 141 142 141  K 4.4 4.5 4.4  CL 105 104 106  CO2 28 28 27   GLUCOSE 92 105* 127*  BUN 23 30* 23  CREATININE 0.87 1.30* 1.12  CALCIUM 8.7* 8.9 8.9  MG  --  2.2 2.0  PHOS  --  5.1*  --    Liver Function Tests: Recent Labs  Lab 12/09/19 0559  AST 13*  ALT 13  ALKPHOS 99  BILITOT 0.5  PROT 6.3*  ALBUMIN 3.0*   No results for input(s): LIPASE, AMYLASE in the last 168 hours. No results for input(s): AMMONIA in the last 168 hours. Cardiac Enzymes: No results for input(s): CKTOTAL, CKMB, CKMBINDEX, TROPONINI in the last 168 hours. BNP (last 3 results) No results for input(s): BNP in the last 8760 hours. CBG: Recent Labs  Lab 12/08/19 [id number] 12/08/19  1150 12/08/19 1652 12/08/19 2049 12/09/19 0615  GLUCAP 109* 117* 164* 249* 118*    Time spent: 35 minutes  Signed:  Lynden Oxford  [facility]  12/09/2019 11:07 AM

## 2019-12-10 LAB — GLUCOSE, CAPILLARY
Glucose-Capillary: 139 mg/dL — ABNORMAL HIGH (ref 70–99)
Glucose-Capillary: 124 mg/dL — ABNORMAL HIGH (ref 70–99)
Glucose-Capillary: 335 mg/dL — ABNORMAL HIGH (ref 70–99)
Glucose-Capillary: 391 mg/dL — ABNORMAL HIGH (ref 70–99)
Glucose-Capillary: 308 mg/dL — ABNORMAL HIGH (ref 70–99)
Glucose-Capillary: 102 mg/dL — ABNORMAL HIGH (ref 70–99)

## 2019-12-10 MED ORDER — INSULIN DETEMIR 100 UNIT/ML ~~LOC~~ SOLN
18.0000 [IU] | Freq: Every morning | SUBCUTANEOUS | Status: DC
  Administered 2019-12-11 – 2019-12-18 (×8): 18 [IU] via SUBCUTANEOUS
  Filled 2019-12-10 (×9): qty 0.18

## 2019-12-10 NOTE — Progress Notes (Signed)
Went in to speak to Andre Rowe about wearing his CPAP tonight.  Patient seemed a little confused.  I asked him if he wanted to wear his CPAP tonight and he stated no.  No distress was noted, will continue to monitor.

## 2019-12-10 NOTE — Progress Notes (Signed)
[facility] PROGRESS NOTE  Patient: Andre Rowe LKJ:[id number]   PCP: Wandra Feinstein, MD DOB: [birth year]   DOA: 11/27/2019   DOS: 12/10/2019    Subjective: Denies any acute complaint.  In better spirits.  No nausea no vomiting.  Objective:  Vitals:   12/10/19 1208 12/10/19 1648  BP: 108/72 133/86  Pulse: 96 89  Resp: 19 20  Temp: (!) 97.4 F (36.3 C) 98.4 F (36.9 C)  SpO2: 93% 100%    S1-S2 present. Clear to auscultation pretension alert and awake oriented to self and place. No abdominal tenderness. No edema.  Assessment and plan: Ataxia. Continue PT OT.  Schizophrenia. Continue current regimen. Type 2 diabetes mellitus uncontrolled with hyperglycemia. Adjusting insulin regimen.  Author: Lynden Oxford, MD [facility] 12/10/2019 7:24 PM   If 7PM-7AM, please contact night-coverage at [email or web address]

## 2019-12-10 NOTE — TOC Progression Note (Signed)
Transition of Care [facility]) - Progression Note    Patient Details  Name: Deaunte Dente MRN: [id number] Date of Birth: September 22, 1940  Transition of Care [facility]) CM/SW Contact  Baldemar [place], [place] Phone Number: 12/10/2019, 12:38 PM  Clinical Narrative:   CSW had confirmed with patient's nephew, Everlean Alstrom, that he was wanting patient to move to [facility] for SNF and asked about completing admission paperwork. Everlean Alstrom provided number for his wife, Lissa Hoard, and CSW passed that on to Admissions with [employer]. [place] alerted CSW after discussion with patient's family that they are not willing to sign his admission paperwork, and that the patient will need a state-appointed guardian after all. CSW called and spoke with Lissa Hoard, who confirmed that they are willing to be involved in the patient's care but they are not able to take full responsibility for his situation and decision-making. Lissa Hoard said they were not aware that would be required of them when they agreed to assist the patient. Lissa Hoard confirmed that she had spoken with Everlean Alstrom and that they are requesting that the patient be provided with a state-appointed legal guardian.   CSW contacted Adult Protective Services to file a report for patient to have a guardian appointed. [facility] is unable to admit the patient without someone to sign him in. CSW updated MD about barrier to discharge. CSW to follow.    Expected Discharge Plan: Skilled Nursing Facility Barriers to Discharge: Family Issues, Other (comment)(Needs guardian)  Expected Discharge Plan and Services Expected Discharge Plan: Skilled Nursing Facility     Post Acute Care Choice: Skilled Nursing Facility Living arrangements for the past 2 months: Group Home Expected Discharge Date: 12/09/19                                     Social Determinants of Health (SDOH) Interventions    Readmission Risk Interventions No flowsheet data found.

## 2019-12-10 NOTE — TOC Progression Note (Signed)
Transition of Care [facility]) - Progression Note    Patient Details  Name: Andre Rowe MRN: [id number] Date of Birth: Apr 18, 1941  Transition of Care [facility]) CM/SW [place], [place] Phone Number: 12/10/2019, 12:48 PM  Clinical Narrative:   CSW received call this morning from APS that patient has been assigned an investigative Education officer, museum and that she will be following up today. CSW then met with APS social worker at the hospital after she visited with patient. CSW answered questions and asked if APS could sign the patient into SNF as he is medically stable, and they said they are not able to do that yet. APS will update CSW when the patient's case has reached a point where someone can sign him into SNF. CSW updated [facility] and they will hold his bed for him for whenever he is able to admit. CSW to follow.    Expected Discharge Plan: Skilled Nursing Facility Barriers to Discharge: Family Issues, Other (comment)(Needs guardian)  Expected Discharge Plan and Services Expected Discharge Plan: [place] Choice: [place] arrangements for the past 2 months: Group Home Expected Discharge Date: 12/09/19                                     Social Determinants of Health (SDOH) Interventions    Readmission Risk Interventions No flowsheet data found.

## 2019-12-10 NOTE — Progress Notes (Signed)
Inpatient Diabetes Program Recommendations  AACE/ADA: New Consensus Statement on Inpatient Glycemic Control (2015)  Target Ranges:  Prepandial:   less than 140 mg/dL      Peak postprandial:   less than 180 mg/dL (1-2 hours)      Critically ill patients:  140 - 180 mg/dL   Lab Results  Component Value Date   GLUCAP 391 (H) 12/10/2019   HGBA1C 7.6 (H) 11/28/2019    Review of Glycemic Control Results for JEWETT, MCGANN (MRN [id number]) as of 12/10/2019 13:34  Ref. Range 12/09/2019 21:16 12/10/2019 06:33 12/10/2019 08:42 12/10/2019 12:13 12/10/2019 12:58  Glucose-Capillary Latest Ref Range: 70 - 99 mg/dL 332 (H) 951 (H) 884 (H) 335 (H) 391 (H)     Inpatient Diabetes  Program Recommendations:     Post prandials elevated.  Please consider Novolog 3 units tid with meals or supplements if eats or drinks at least 50%   Thank you, Dulce Sellar, RN, BSN Diabetes Coordinator Inpatient Diabetes Program [phone] (team pager from 8a-5p)

## 2019-12-11 DIAGNOSIS — R270 Ataxia, unspecified: Secondary | ICD-10-CM | POA: Diagnosis not present

## 2019-12-11 LAB — GLUCOSE, CAPILLARY
Glucose-Capillary: 125 mg/dL — ABNORMAL HIGH (ref 70–99)
Glucose-Capillary: 187 mg/dL — ABNORMAL HIGH (ref 70–99)
Glucose-Capillary: 205 mg/dL — ABNORMAL HIGH (ref 70–99)
Glucose-Capillary: 199 mg/dL — ABNORMAL HIGH (ref 70–99)

## 2019-12-11 NOTE — TOC Progression Note (Signed)
Transition of Care [facility]) - Progression Note    Patient Details  Name: Andre Rowe MRN: [id number] Date of Birth: 01/02/1941  Transition of Care [facility]) CM/SW Contact  Nonda Lou, [place] Phone Number: 12/11/2019, 2:26 PM  Clinical Narrative:    CSW received telephone call from RN stating Vonshell from [facility] was inquiring on the hold-up for patient discharging.   CSW contacted Vonshell and informed her patient can dishcarge once he is able to be signed into the facility. Vonshell went on to express she has verified patient's DOB is [birth year] after getting his birth certificate and she is unsure if the dob mixup will cause insurance issues. She stated Medicaid has patient's birth date as [birth year] and when anything is done with Medicare, the DOB is [birth year]. She went on to state the APS worker contacted her on Friday morning, and she was not able to reach APS worker to verify the reason. Vonshell stated she can be contacted for any questions or concerns.   Expected Discharge Plan: Skilled Nursing Facility Barriers to Discharge: Family Issues, Other (comment)(Needs guardian)  Expected Discharge Plan and Services Expected Discharge Plan: Skilled Nursing Facility     Post Acute Care Choice: Skilled Nursing Facility Living arrangements for the past 2 months: Group Home Expected Discharge Date: 12/09/19                                     Social Determinants of Health (SDOH) Interventions    Readmission Risk Interventions No flowsheet data found.

## 2019-12-11 NOTE — Progress Notes (Signed)
[facility] Progress Note  Patient: Andre Rowe    FYB:[id number]  DOA: 11/27/2019     Date of Service: the patient was seen and examined on 12/11/2019  Chief Complaint  Patient presents with  . Fall   Brief hospital course: As per the H and P dictated on admission, "Brien Pritchettis a 79 y.o.malewith a history of history of schizophrenia, dementia, type 2 diabetes, hypertension, hyperlipidemia. Patient currently lives at [facility]. As the patient has dementia and schizophrenia, the patient is unable to give history. History is obtained by caregiver. Patient has been having a gradual decline over the past week, but a more sudden decline over the past 12 hours. Since today, the patient has needed a fair amount assistance in walking as he is unable to balance. This is a new change. The patient had 2 falls earlier today: The first in the shower, then on the porch while smoking. Patient hit his head and left shoulder on the second fall. He was brought to the hospital for evaluation. No seeming palliating or provoking factors. No fevers, chills, nausea, vomiting, decreased appetite, slurred speech, weakness.  Patient does appear to have fairly significant dementia and does not have insight or judgment. Per the patient's caregiver, the patient has no meaningful relationship and does not have a power of attorney or guardian."  Currently further plan is patient awaiting state representative for discharge paperwork.  Assessment and Plan: Ataxia. Ambulatory dysfunction. Recurrent fall. Patient is from a group home was brought to the hospital due to acute decline on top of patient's progressive decline. Patient had 2 falls during the day on day of admission. MRI brain was performed which was negative for any acute stroke or abnormality. PT OT was consulted. Patient after initial refusal is agreeable to work with therapy currently requiring SNF level of care to  improve mobility enough that he can go back to group home.  Schizophrenia. On Tegretol Klonopin and Zyprexa at home. Continue home regimen for now.  Essential hypertension. Blood pressure stable. Continue current regimen.  History of subdural hematoma. CT scan unremarkable.  Unspecified dementia with behavioral disturbance continue supportive care  Type 2 diabetes mellitus without complication. Controlled. Hemoglobin A1c 7.6. Continue current insulin regimen .  Body mass index is 33.41 kg/m.  Nutrition Problem: Inadequate oral intake Etiology: poor appetite Interventions: Interventions: Ensure Enlive (each supplement provides 350kcal and 20 grams of protein), MVI  Diet: Carb modified diet DVT Prophylaxis: Subcutaneous Heparin    Advance goals of care discussion: Full code  Family Communication: no family was present at bedside, at the time of interview.   Disposition:  Status is: Inpatient  Remains inpatient appropriate because:Unsafe d/c plan   Dispo: The patient is from: Group home              Anticipated d/c is to: SNF              Anticipated d/c date is: 2 days              Patient currently is medically stable to d/c.  Subjective: No nausea no vomiting.  No fever no chills.  No acute complaints.  No constipation.  Physical Exam: General:  alert oriented to time, place, and person.  Appear in mild distress, affect appropriate Eyes: PERRL ENT: Oral Mucosa Clear, moist  Neck: no JVD,  Cardiovascular: S1 and S2 Present, no Murmur,  Respiratory: good respiratory effort, Bilateral Air entry equal and Decreased, no Crackles, no wheezes Abdomen:  Bowel Sound present, Soft and no tenderness,  Skin: no rash Extremities: no Pedal edema, no calf tenderness Neurologic: without any new focal findings  Gait not checked due to patient safety concerns  Vitals:   12/11/19 0413 12/11/19 0804 12/11/19 1226 12/11/19 1339  BP: 105/63 111/75 (!) 189/159 114/84    Pulse: 89 (!) 102 93   Resp: 18 20 17    Temp: (!) 97.5 F (36.4 C) 98.1 F (36.7 C) 97.9 F (36.6 C)   TempSrc: Oral Oral Oral   SpO2: 100% 91% 98%   Weight:      Height:        Intake/Output Summary (Last 24 hours) at 12/11/2019 1645 Last data filed at 12/11/2019 1428 Gross per 24 hour  Intake 797 ml  Output 1400 ml  Net -603 ml   Filed Weights   11/27/19 1443  Weight: 93.9 kg    Data Reviewed: I have personally reviewed and interpreted daily labs, tele strips, imagings as discussed above. I reviewed all nursing notes, pharmacy notes, vitals, pertinent old records I have discussed plan of care as described above with RN and patient/family.  CBC: Recent Labs  Lab 12/06/19 0431 12/09/19 0559  WBC 4.9 4.9  NEUTROABS  --  1.6*  HGB 11.5* 12.4*  HCT 36.2* 38.6*  MCV 110.4* 107.5*  PLT 250 273   Basic Metabolic Panel: Recent Labs  Lab 12/06/19 0431 12/09/19 0559  NA 142 141  K 4.5 4.4  CL 104 106  CO2 28 27  GLUCOSE 105* 127*  BUN 30* 23  CREATININE 1.30* 1.12  CALCIUM 8.9 8.9  MG 2.2 2.0  PHOS 5.1*  --     Studies: No results found.  Scheduled Meds: . carbamazepine  200 mg Oral TID  . clonazePAM  0.5 mg Oral TID  . donepezil  10 mg Oral QHS  . enoxaparin (LOVENOX) injection  40 mg Subcutaneous Daily  . feeding supplement (ENSURE ENLIVE)  237 mL Oral TID BM  . insulin aspart  0-6 Units Subcutaneous TID WC  . insulin detemir  18 Units Subcutaneous q morning - 10a  . latanoprost  1 drop Both Eyes QHS  . memantine  10 mg Oral BID  . multivitamin with minerals  1 tablet Oral Daily  . OLANZapine  10 mg Oral QHS  . oxybutynin  5 mg Oral BID  . simvastatin  40 mg Oral Daily  . tamsulosin  0.4 mg Oral Daily  . thiamine  100 mg Oral Daily   Continuous Infusions: PRN Meds: acetaminophen, bisacodyl, ondansetron **OR** ondansetron (ZOFRAN) IV, polyethylene glycol  Time spent: 35 minutes  Author: 12/11/19, MD [facility] 12/11/2019 4:45  PM  To reach On-call, see care teams to locate the attending and reach out to them via [email or web address]/13/2019. If 7PM-7AM, please contact night-coverage If you still have difficulty reaching the attending provider, please page the [facility] (Director on Call) for [facility] on amion for assistance.

## 2019-12-12 DIAGNOSIS — R270 Ataxia, unspecified: Secondary | ICD-10-CM | POA: Diagnosis not present

## 2019-12-12 LAB — GLUCOSE, CAPILLARY
Glucose-Capillary: 117 mg/dL — ABNORMAL HIGH (ref 70–99)
Glucose-Capillary: 222 mg/dL — ABNORMAL HIGH (ref 70–99)
Glucose-Capillary: 131 mg/dL — ABNORMAL HIGH (ref 70–99)
Glucose-Capillary: 144 mg/dL — ABNORMAL HIGH (ref 70–99)

## 2019-12-12 NOTE — Progress Notes (Signed)
[facility] Progress Note  Patient: Andre Rowe    TUU:[id number]  DOA: 11/27/2019     Date of Service: the patient was seen and examined on 12/12/2019  Chief Complaint  Patient presents with  . Fall   Brief hospital course: As per the H and P dictated on admission, "Andre Pritchettis a 79 y.o.malewith a history of history of schizophrenia, dementia, type 2 diabetes, hypertension, hyperlipidemia. Patient currently lives at [facility]. As the patient has dementia and schizophrenia, the patient is unable to give history. History is obtained by caregiver. Patient has been having a gradual decline over the past week, but a more sudden decline over the past 12 hours. Since today, the patient has needed a fair amount assistance in walking as he is unable to balance. This is a new change. The patient had 2 falls earlier today: The first in the shower, then on the porch while smoking. Patient hit his head and left shoulder on the second fall. He was brought to the hospital for evaluation. No seeming palliating or provoking factors. No fevers, chills, nausea, vomiting, decreased appetite, slurred speech, weakness.  Patient does appear to have fairly significant dementia and does not have insight or judgment. Per the patient's caregiver, the patient has no meaningful relationship and does not have a power of attorney or guardian."  Currently further plan is patient awaiting state representative for discharge paperwork.  Assessment and Plan: Ataxia. Ambulatory dysfunction. Recurrent fall. Patient is from a group home was brought to the hospital due to acute decline on top of patient's progressive decline. Patient had 2 falls during the day on day of admission. MRI brain was performed which was negative for any acute stroke or abnormality. PT OT was consulted. Patient after initial refusal is agreeable to work with therapy currently requiring SNF level of care to  improve mobility enough that he can go back to group home.  Schizophrenia. On Tegretol Klonopin and Zyprexa at home. Continue home regimen for now.  Essential hypertension. Blood pressure stable. Continue current regimen.  History of subdural hematoma. CT scan unremarkable.  Unspecified dementia with behavioral disturbance continue supportive care  Type 2 diabetes mellitus without complication. Controlled. Hemoglobin A1c 7.6. Continue current insulin regimen .   Foul-smelling urine. Recommending maintaining hygiene. Maintain hydration. No fever no chills no other symptoms.  Currently no indication for further work-up.  Body mass index is 33.41 kg/m.  Nutrition Problem: Inadequate oral intake Etiology: poor appetite Interventions: Interventions: Ensure Enlive (each supplement provides 350kcal and 20 grams of protein), MVI  Diet: Carb modified diet DVT Prophylaxis: Subcutaneous Heparin    Advance goals of care discussion: Full code  Family Communication: no family was present at bedside, at the time of interview.   Disposition:  Status is: Inpatient  Remains inpatient appropriate because:Unsafe d/c plan   Dispo: The patient is from: Group home              Anticipated d/c is to: SNF              Anticipated d/c date is: 2 days              Patient currently is medically stable to d/c.  Subjective: No nausea no vomiting.  No fever no chills.  No chest pain abdomen.  No acute events overnight.  Physical Exam: General:  alert oriented to time, place, and person.  Appear in mild distress, affect appropriate Eyes: PERRL ENT: Oral Mucosa Clear, moist  Neck:  no JVD,  Cardiovascular: S1 and S2 Present, no Murmur,  Respiratory: good respiratory effort, Bilateral Air entry equal and Decreased, no Crackles, no wheezes Abdomen: Bowel Sound present, Soft and no tenderness,  Skin: no rash Extremities: no Pedal edema, no calf tenderness Neurologic: without any new  focal findings  Gait not checked due to patient safety concerns  Vitals:   12/12/19 0452 12/12/19 0756 12/12/19 1208 12/12/19 1548  BP: 130/87 121/80 111/78 (!) 128/92  Pulse: 91 82 82 84  Resp: 18 17 16 20   Temp: 98 F (36.7 C) 98.2 F (36.8 C) 98.2 F (36.8 C) 98 F (36.7 C)  TempSrc: Oral Oral Oral Axillary  SpO2: 96% 100% 94% 98%  Weight:      Height:        Intake/Output Summary (Last 24 hours) at 12/12/2019 1741 Last data filed at 12/12/2019 0800 Gross per 24 hour  Intake 240 ml  Output 700 ml  Net -460 ml   Filed Weights   11/27/19 1443  Weight: 93.9 kg    Data Reviewed: I have personally reviewed and interpreted daily labs, tele strips, imagings as discussed above. I reviewed all nursing notes, pharmacy notes, vitals, pertinent old records I have discussed plan of care as described above with RN and patient/family.  CBC: Recent Labs  Lab 12/06/19 0431 12/09/19 0559  WBC 4.9 4.9  NEUTROABS  --  1.6*  HGB 11.5* 12.4*  HCT 36.2* 38.6*  MCV 110.4* 107.5*  PLT 250 273   Basic Metabolic Panel: Recent Labs  Lab 12/06/19 0431 12/09/19 0559  NA 142 141  K 4.5 4.4  CL 104 106  CO2 28 27  GLUCOSE 105* 127*  BUN 30* 23  CREATININE 1.30* 1.12  CALCIUM 8.9 8.9  MG 2.2 2.0  PHOS 5.1*  --     Studies: No results found.  Scheduled Meds: . carbamazepine  200 mg Oral TID  . clonazePAM  0.5 mg Oral TID  . donepezil  10 mg Oral QHS  . enoxaparin (LOVENOX) injection  40 mg Subcutaneous Daily  . feeding supplement (ENSURE ENLIVE)  237 mL Oral TID BM  . insulin aspart  0-6 Units Subcutaneous TID WC  . insulin detemir  18 Units Subcutaneous q morning - 10a  . latanoprost  1 drop Both Eyes QHS  . memantine  10 mg Oral BID  . multivitamin with minerals  1 tablet Oral Daily  . OLANZapine  10 mg Oral QHS  . oxybutynin  5 mg Oral BID  . simvastatin  40 mg Oral Daily  . tamsulosin  0.4 mg Oral Daily  . thiamine  100 mg Oral Daily   Continuous Infusions: PRN  Meds: acetaminophen, bisacodyl, ondansetron **OR** ondansetron (ZOFRAN) IV, polyethylene glycol  Time spent: 35 minutes  Author: 12/11/19, MD [facility] 12/12/2019 5:41 PM  To reach On-call, see care teams to locate the attending and reach out to them via [email or web address]/14/2019. If 7PM-7AM, please contact night-coverage If you still have difficulty reaching the attending provider, please page the [facility] (Director on Call) for [facility] on amion for assistance.

## 2019-12-13 DIAGNOSIS — R270 Ataxia, unspecified: Secondary | ICD-10-CM | POA: Diagnosis not present

## 2019-12-13 LAB — GLUCOSE, CAPILLARY
Glucose-Capillary: 128 mg/dL — ABNORMAL HIGH (ref 70–99)
Glucose-Capillary: 245 mg/dL — ABNORMAL HIGH (ref 70–99)
Glucose-Capillary: 201 mg/dL — ABNORMAL HIGH (ref 70–99)
Glucose-Capillary: 135 mg/dL — ABNORMAL HIGH (ref 70–99)

## 2019-12-13 NOTE — Progress Notes (Signed)
Physical Therapy Treatment Patient Details Name: Andre Rowe MRN: [id number] DOB: 04-26-1941 Today's Date: 12/13/2019    History of Present Illness Andre Rowe is a 79 y.o. male admitted 11/27/19 s/p x2 falls hitting head and shoulder with generalized weakness. CT C spine- multilvel degenerative changes, no fx. CT head: negative. CT shoulder negative. MRI brain no acute changes.  PMHx: schizophrenia, dementia, T2DM, HTN, HLD. Patient currently lives at [facility]    PT Comments    Patient is making gradual progress toward PT goals and tolerated short distance gait this session. Pt continues to present with generalized weakness and L lateral bias in sitting/standing. Continue to progress as tolerated with anticipated d/c to SNF for further skilled PT services.      Follow Up Recommendations  SNF;Supervision/Assistance - 24 hour     Equipment Recommendations  Other (comment)(TBD next venue)    Recommendations for Other Services       Precautions / Restrictions Precautions Precautions: Fall Restrictions Weight Bearing Restrictions: No    Mobility  Bed Mobility Overal bed mobility: Needs Assistance Bed Mobility: Supine to Sit     Supine to sit: HOB elevated;Mod assist;+2 for physical assistance     General bed mobility comments: multimodal cues and assistance required more due to cognition versus strength  Transfers Overall transfer level: Needs assistance Equipment used: Rolling walker (2 wheeled) Transfers: Sit to/from Stand Sit to Stand: Mod assist;+2 physical assistance;+2 safety/equipment         General transfer comment: assist to power up into stanidng and cues for upright posture upon standing; cues for safe hand placement from EOB  Ambulation/Gait Ambulation/Gait assistance: Mod assist;+2 safety/equipment;Min assist Gait Distance (Feet): 10 Feet Assistive device: Rolling walker (2 wheeled) Gait Pattern/deviations: Step-to  pattern;Step-through pattern;Decreased step length - right;Decreased step length - left;Trunk flexed     General Gait Details: left lateral bias; assist for balance and managing RW; pt began sitting prematurely so recliner brought up behind pt   Stairs             Wheelchair Mobility    Modified Rankin (Stroke Patients Only)       Balance Overall balance assessment: History of Falls;Needs assistance Sitting-balance support: Feet supported Sitting balance-Leahy Scale: Fair   Postural control: Left lateral lean Standing balance support: Bilateral upper extremity supported Standing balance-Leahy Scale: Poor                              Cognition Arousal/Alertness: Awake/alert Behavior During Therapy: Flat affect Overall Cognitive Status: No family/caregiver present to determine baseline cognitive functioning                                 General Comments: pt speaking nearly entire time during session however mostly unintelligible and tangential; pt often making noises and sticking tongue out; pt responding "no" when asked to do something however if task initiated pt did participate       Exercises      General Comments        Pertinent Vitals/Pain Pain Assessment: Faces Faces Pain Scale: No hurt    Home Living                      Prior Function            PT Goals (current goals can now be found in the  care plan section) Progress towards PT goals: Progressing toward goals    Frequency    Min 2X/week      PT Plan Current plan remains appropriate    Co-evaluation              AM-PAC PT "6 Clicks" Mobility   Outcome Measure  Help needed turning from your back to your side while in a flat bed without using bedrails?: A Lot Help needed moving from lying on your back to sitting on the side of a flat bed without using bedrails?: A Lot Help needed moving to and from a bed to a chair (including a  wheelchair)?: A Lot Help needed standing up from a chair using your arms (e.g., wheelchair or bedside chair)?: A Lot Help needed to walk in hospital room?: A Lot Help needed climbing 3-5 steps with a railing? : Total 6 Click Score: 11    End of Session Equipment Utilized During Treatment: Gait belt Activity Tolerance: Patient tolerated treatment well Patient left: in chair;with chair alarm set;with call bell/phone within reach Nurse Communication: Mobility status PT Visit Diagnosis: Unsteadiness on feet (R26.81);Other symptoms and signs involving the nervous system (R29.898);Difficulty in walking, not elsewhere classified (R26.2);Muscle weakness (generalized) (M62.81)     Time: 1331-1350 PT Time Calculation (min) (ACUTE ONLY): 19 min  Charges:  $Gait Training: 8-22 mins                     Earney Navy, PTA Acute Rehabilitation Services Pager: [phone] Office: [phone]     Darliss Cheney 12/13/2019, 4:22 PM

## 2019-12-13 NOTE — Care Management Important Message (Signed)
Important Message  Patient Details  Name: Andre Rowe MRN: [id number] Date of Birth: 1941/07/13   Medicare Important Message Given:  Yes - Important Message mailed due to current National Emergency   Verbal consent obtained due to current National Emergency  Relationship to patient: Child Contact Name: Carolynn Sayers Call Date: 12/13/19  Time: 1614 Phone: [phone] Outcome: Spoke with contact Important Message mailed to: Other (must enter [address] [place] [address])     Dorena Bodo 12/13/2019, 4:16 PM

## 2019-12-13 NOTE — Progress Notes (Signed)
[facility] Progress Note  Patient: Andre Rowe    EXN:[id number]  DOA: 11/27/2019     Date of Service: the patient was seen and examined on 12/13/2019  Chief Complaint  Patient presents with  . Fall   Brief hospital course: As per the H and P dictated on admission, "Andre Pritchettis a 79 y.o.malewith a history of history of schizophrenia, dementia, type 2 diabetes, hypertension, hyperlipidemia. Patient currently lives at [facility]. As the patient has dementia and schizophrenia, the patient is unable to give history. History is obtained by caregiver. Patient has been having a gradual decline over the past week, but a more sudden decline over the past 12 hours. Since today, the patient has needed a fair amount assistance in walking as he is unable to balance. This is a new change. The patient had 2 falls earlier today: The first in the shower, then on the porch while smoking. Patient hit his head and left shoulder on the second fall. He was brought to the hospital for evaluation. No seeming palliating or provoking factors. No fevers, chills, nausea, vomiting, decreased appetite, slurred speech, weakness.  Patient does appear to have fairly significant dementia and does not have insight or judgment. Per the patient's caregiver, the patient has no meaningful relationship and does not have a power of attorney or guardian."  Currently further plan is patient awaiting state representative for discharge paperwork.  Assessment and Plan: Ataxia. Ambulatory dysfunction. Recurrent fall. Patient is from a group home was brought to the hospital due to acute decline on top of patient's progressive decline. Patient had 2 falls during the day on day of admission. MRI brain was performed which was negative for any acute stroke or abnormality. PT OT was consulted. Patient after initial refusal is agreeable to work with therapy currently requiring SNF level of care to  improve mobility enough that he can go back to group home.  Schizophrenia. On Tegretol Klonopin and Zyprexa at home. Continue home regimen for now.  Essential hypertension. Blood pressure stable. Continue current regimen.  History of subdural hematoma. CT scan unremarkable.  Unspecified dementia with behavioral disturbance continue supportive care  Type 2 diabetes mellitus without complication. Controlled. Hemoglobin A1c 7.6. Continue current insulin regimen .   Foul-smelling urine. Recommending maintaining hygiene. Maintain hydration. No fever no chills no other symptoms.  Currently no indication for further work-up.  Body mass index is 33.41 kg/m.  Nutrition Problem: Inadequate oral intake Etiology: poor appetite Interventions: Interventions: Ensure Enlive (each supplement provides 350kcal and 20 grams of protein), MVI  Diet: Carb modified diet DVT Prophylaxis: Subcutaneous Heparin    Advance goals of care discussion: Full code  Family Communication: no family was present at bedside, at the time of interview.   Disposition:  Status is: Inpatient  Remains inpatient appropriate because:Unsafe d/c plan   Dispo: The patient is from: Group home              Anticipated d/c is to: SNF              Anticipated d/c date is: 2 days              Patient currently is medically stable to d/c.  Subjective: No acute complaint no nausea no vomiting.  No fever no chills.  No chest pain.  Sleeping okay.Marland Kitchen  Physical Exam: General:  alert oriented to time, place, and person.  Appear in mild distress, affect appropriate Eyes: PERRL ENT: Oral Mucosa Clear, moist  Neck:  no JVD,  Cardiovascular: S1 and S2 Present, no Murmur,  Respiratory: good respiratory effort, Bilateral Air entry equal and Decreased, no Crackles, no wheezes Abdomen: Bowel Sound present, Soft and no tenderness,  Skin: no rash Extremities: no Pedal edema, no calf tenderness Neurologic: without any new  focal findings  Gait not checked due to patient safety concerns  Vitals:   12/13/19 0429 12/13/19 0821 12/13/19 1220 12/13/19 1540  BP: (!) 125/95 (!) 137/99 (!) 121/108 108/76  Pulse: 61 70 68 94  Resp: 18 16 17 18   Temp: (!) 97.4 F (36.3 C) 98.1 F (36.7 C) (!) 97.4 F (36.3 C)   TempSrc: Oral Oral Oral   SpO2: 98% 97% 100% 97%  Weight:      Height:        Intake/Output Summary (Last 24 hours) at 12/13/2019 1858 Last data filed at 12/13/2019 1620 Gross per 24 hour  Intake -  Output 1250 ml  Net -1250 ml   Filed Weights   11/27/19 1443  Weight: 93.9 kg    Data Reviewed: I have personally reviewed and interpreted daily labs, tele strips, imagings as discussed above. I reviewed all nursing notes, pharmacy notes, vitals, pertinent old records I have discussed plan of care as described above with RN and patient/family.  CBC: Recent Labs  Lab 12/09/19 0559  WBC 4.9  NEUTROABS 1.6*  HGB 12.4*  HCT 38.6*  MCV 107.5*  PLT 885   Basic Metabolic Panel: Recent Labs  Lab 12/09/19 0559  NA 141  K 4.4  CL 106  CO2 27  GLUCOSE 127*  BUN 23  CREATININE 1.12  CALCIUM 8.9  MG 2.0    Studies: No results found.  Scheduled Meds: . carbamazepine  200 mg Oral TID  . clonazePAM  0.5 mg Oral TID  . donepezil  10 mg Oral QHS  . enoxaparin (LOVENOX) injection  40 mg Subcutaneous Daily  . feeding supplement (ENSURE ENLIVE)  237 mL Oral TID BM  . insulin aspart  0-6 Units Subcutaneous TID WC  . insulin detemir  18 Units Subcutaneous q morning - 10a  . latanoprost  1 drop Both Eyes QHS  . memantine  10 mg Oral BID  . multivitamin with minerals  1 tablet Oral Daily  . OLANZapine  10 mg Oral QHS  . oxybutynin  5 mg Oral BID  . simvastatin  40 mg Oral Daily  . tamsulosin  0.4 mg Oral Daily  . thiamine  100 mg Oral Daily   Continuous Infusions: PRN Meds: acetaminophen, bisacodyl, ondansetron **OR** ondansetron (ZOFRAN) IV, polyethylene glycol  Time spent: 35 minutes   Author: Berle Mull, MD [facility] 12/13/2019 6:58 PM  To reach On-call, see care teams to locate the attending and reach out to them via [email or web address]. If 7PM-7AM, please contact night-coverage If you still have difficulty reaching the attending provider, please page the [facility] (Director on Call) for [facility] on amion for assistance.

## 2019-12-14 DIAGNOSIS — R270 Ataxia, unspecified: Secondary | ICD-10-CM | POA: Diagnosis not present

## 2019-12-14 LAB — GLUCOSE, CAPILLARY
Glucose-Capillary: 130 mg/dL — ABNORMAL HIGH (ref 70–99)
Glucose-Capillary: 153 mg/dL — ABNORMAL HIGH (ref 70–99)
Glucose-Capillary: 129 mg/dL — ABNORMAL HIGH (ref 70–99)
Glucose-Capillary: 84 mg/dL (ref 70–99)

## 2019-12-14 NOTE — Care Management Important Message (Signed)
Important Message  Patient Details  Name: Stepfon Rawles MRN: [id number] Date of Birth: November 07, 1940   Medicare Important Message Given:  Yes     Eswin Worrell Stefan Church 12/14/2019, 3:14 PM

## 2019-12-14 NOTE — Progress Notes (Signed)
[facility] Progress Note  Patient: Andre Rowe    IWP:[id number]  DOA: 11/27/2019     Date of Service: the patient was seen and examined on 12/14/2019  Chief Complaint  Patient presents with  . Fall   Brief hospital course: As per the H and P dictated on admission, "Andre Pritchettis a 79 y.o.malewith a history of schizophrenia, dementia, type 2 diabetes, hypertension, hyperlipidemia. Patient currently lives at [facility]. As the patient has dementia and schizophrenia, the patient is unable to give history. History is obtained from caregiver. Patient has been having a gradual decline over the past week, but a more sudden decline over the past 12 hours. Since today, the patient has needed a fair amount assistance in walking as he is unable to balance. This is a new change. The patient had 2 falls earlier today: The first in the shower, then on the porch while smoking. Patient hit his head and left shoulder on the second fall. He was brought to the hospital for evaluation.   Patient does appear to have fairly significant dementia and does not have insight or judgment. Per the patient's caregiver, the patient has no meaningful relationship and does not have a power of attorney or guardian."  Currently further plan is patient awaiting state representative for discharge paperwork.  Assessment and Plan: Ataxia. Ambulatory dysfunction. Recurrent fall. Patient is from a group home was brought to the hospital due to acute decline on top of patient's progressive decline. Patient had 2 falls during the day on day of admission. MRI brain was performed which was negative for any acute stroke or abnormality. PT OT was consulted. Patient after initial refusal is agreeable to work with therapy currently requiring SNF level of care to improve mobility enough that he can go back to group home.  Schizophrenia. On Tegretol, Klonopin and Zyprexa at home. Continue home regimen  for now.  Essential hypertension. Blood pressure stable. Continue current regimen.  History of subdural hematoma. CT scan unremarkable.  Unspecified dementia with behavioral disturbance continue supportive care  Type 2 diabetes mellitus without complication. Controlled. Hemoglobin A1c 7.6. Continue current insulin regimen .   Foul-smelling urine. Recommending maintaining hygiene. Maintain hydration. No fever no chills no other symptoms.  Currently no indication for further work-up.  Body mass index is 33.41 kg/m.  Nutrition Problem: Inadequate oral intake Etiology: poor appetite Interventions: Interventions: Ensure Enlive (each supplement provides 350kcal and 20 grams of protein), MVI  Diet: Carb modified diet DVT Prophylaxis: Subcutaneous Heparin    Advance goals of care discussion: Full code  Family Communication: no family was present at bedside.  Disposition:  Status is: Inpatient  Remains inpatient appropriate because:Unsafe d/c plan   Dispo: The patient is from: Group home              Anticipated d/c is to: SNF              Anticipated d/c date is: when bed available.              Patient currently is medically stable to d/c.  Subjective: No overnight events.  Patient not very interested to talk.   Physical Exam: Physical Exam  Constitutional: He is well-developed, well-nourished, and in no distress.  HENT:  Head: Atraumatic.  Cardiovascular: Normal rate.  Psychiatric:  Sleepy.  Affect.  Does follow simple, not able to understand his speech.    Vitals:   12/13/19 2000 12/14/19 0350 12/14/19 0817 12/14/19 1225  BP: 127/85 100/70  94/67 118/80  Pulse: 70 89 92 78  Resp: 18 16 20 20   Temp: 97.9 F (36.6 C) 98.9 F (37.2 C) 98.5 F (36.9 C) 98.4 F (36.9 C)  TempSrc: Oral Axillary Oral Oral  SpO2: 97% 93% 97% 98%  Weight:      Height:        Intake/Output Summary (Last 24 hours) at 12/14/2019 1621 Last data filed at 12/14/2019  1319 Gross per 24 hour  Intake 714 ml  Output 1000 ml  Net -286 ml   Filed Weights   11/27/19 1443  Weight: 93.9 kg    Data Reviewed: I have personally reviewed and interpreted daily labs, tele strips, imagings as discussed above. I reviewed all nursing notes, pharmacy notes, vitals, pertinent old records I have discussed plan of care as described above with RN and patient/family.  CBC: Recent Labs  Lab 12/09/19 0559  WBC 4.9  NEUTROABS 1.6*  HGB 12.4*  HCT 38.6*  MCV 107.5*  PLT 273   Basic Metabolic Panel: Recent Labs  Lab 12/09/19 0559  NA 141  K 4.4  CL 106  CO2 27  GLUCOSE 127*  BUN 23  CREATININE 1.12  CALCIUM 8.9  MG 2.0    Studies: No results found.  Scheduled Meds: . carbamazepine  200 mg Oral TID  . clonazePAM  0.5 mg Oral TID  . donepezil  10 mg Oral QHS  . enoxaparin (LOVENOX) injection  40 mg Subcutaneous Daily  . feeding supplement (ENSURE ENLIVE)  237 mL Oral TID BM  . insulin aspart  0-6 Units Subcutaneous TID WC  . insulin detemir  18 Units Subcutaneous q morning - 10a  . latanoprost  1 drop Both Eyes QHS  . memantine  10 mg Oral BID  . multivitamin with minerals  1 tablet Oral Daily  . OLANZapine  10 mg Oral QHS  . oxybutynin  5 mg Oral BID  . simvastatin  40 mg Oral Daily  . tamsulosin  0.4 mg Oral Daily  . thiamine  100 mg Oral Daily   Continuous Infusions: PRN Meds: acetaminophen, bisacodyl, ondansetron **OR** ondansetron (ZOFRAN) IV, polyethylene glycol  Time spent: 25 minutes

## 2019-12-15 DIAGNOSIS — R270 Ataxia, unspecified: Secondary | ICD-10-CM | POA: Diagnosis not present

## 2019-12-15 LAB — GLUCOSE, CAPILLARY
Glucose-Capillary: 125 mg/dL — ABNORMAL HIGH (ref 70–99)
Glucose-Capillary: 248 mg/dL — ABNORMAL HIGH (ref 70–99)
Glucose-Capillary: 163 mg/dL — ABNORMAL HIGH (ref 70–99)
Glucose-Capillary: 286 mg/dL — ABNORMAL HIGH (ref 70–99)

## 2019-12-15 MED ORDER — INSULIN ASPART 100 UNIT/ML ~~LOC~~ SOLN
3.0000 [IU] | Freq: Once | SUBCUTANEOUS | Status: AC
  Administered 2019-12-15: 3 [IU] via SUBCUTANEOUS

## 2019-12-15 NOTE — Progress Notes (Signed)
CBG 286, Opyd informed

## 2019-12-15 NOTE — Progress Notes (Signed)
Occupational Therapy Treatment Patient Details Name: Andre Rowe MRN: [id number] DOB: [birth year] Today's Date: 12/15/2019    History of present illness Andre Rowe is a 79 y.o. male admitted 11/27/19 s/p x2 falls hitting head and shoulder with generalized weakness. CT C spine- multilvel degenerative changes, no fx. CT head: negative. CT shoulder negative. MRI brain no acute changes.  PMHx: schizophrenia, dementia, T2DM, HTN, HLD. Patient currently lives at [facility]   OT comments  Pt found incontinent of bowel and verbalizing need to void. Pt fixated on need to void bowels this session. Pt completed bed mobility with cues to engage in trunk rotation and cervical rotation. Pt prefers L side lying and pushign back into L side. Pt rolled to R side during care to encourage R attention to task.   Follow Up Recommendations  SNF    Equipment Recommendations  Wheelchair (measurements OT);Wheelchair cushion (measurements OT);Hospital bed    Recommendations for Other Services      Precautions / Restrictions Precautions Precautions: Fall       Mobility Bed Mobility Overal bed mobility: Needs Assistance Bed Mobility: Rolling Rolling: Max assist         General bed mobility comments: pt rolling max (A) R and L. pt does bend R knee adn pull with R arm on hob to slide up in the bed on command  Transfers                 General transfer comment: deferred this session due to extensive peri care required     Balance                                           ADL either performed or assessed with clinical judgement   ADL Overall ADL's : Needs assistance/impaired             Lower Body Bathing: Total assistance Lower Body Bathing Details (indicate cue type and reason): Supine peri care Upper Body Dressing : Maximal assistance Upper Body Dressing Details (indicate cue type and reason): pt uable to sequence don of gown. pt requires hand over  hand                   General ADL Comments: bed level care as patient voiding stool and required (A) to complete peri care     Vision       Perception     Praxis      Cognition Arousal/Alertness: Awake/alert Behavior During Therapy: Flat affect Overall Cognitive Status: No family/caregiver present to determine baseline cognitive functioning                                 General Comments: verbalized need to void bowels. upon (A) discovered pt had already voided and was unaware.         Exercises     Shoulder Instructions       General Comments increased risk for skin break down due to incontinence    Pertinent Vitals/ Pain          Home Living                                          Prior Functioning/Environment  Frequency  Min 2X/week        Progress Toward Goals  OT Goals(current goals can now be found in the care plan section)  Progress towards OT goals: Not progressing toward goals - comment  Acute Rehab OT Goals Patient Stated Goal: pt unable OT Goal Formulation: Patient unable to participate in goal setting Time For Goal Achievement: 12/29/19(remains appropriate at this time) Potential to Achieve Goals: Fair ADL Goals Pt Will Perform Grooming: with set-up;sitting Pt Will Perform Upper Body Bathing: with set-up;sitting Pt Will Transfer to Toilet: with min assist;bedside commode;ambulating Additional ADL Goal #1: pt will complete bed mobility min (A) as precursor to adls. Additional ADL Goal #2: Pt will follow 2 step commands  Plan Discharge plan remains appropriate    Co-evaluation                 AM-PAC OT "6 Clicks" Daily Activity     Outcome Measure   Help from another person eating meals?: A Little Help from another person taking care of personal grooming?: A Lot Help from another person toileting, which includes using toliet, bedpan, or urinal?: Total Help from another  person bathing (including washing, rinsing, drying)?: Total Help from another person to put on and taking off regular upper body clothing?: Total Help from another person to put on and taking off regular lower body clothing?: Total 6 Click Score: 9    End of Session    OT Visit Diagnosis: Unsteadiness on feet (R26.81);Muscle weakness (generalized) (M62.81)   Activity Tolerance Patient tolerated treatment well   Patient Left in bed;with call bell/phone within reach;with bed alarm set   Nurse Communication Mobility status;Precautions        Time: 1287-8676 OT Time Calculation (min): 16 min  Charges: OT General Charges $OT Visit: 1 Visit OT Treatments $Self Care/Home Management : 8-22 mins   Brynn, OTR/L  Acute Rehabilitation Services Pager: [phone] Office: [phone] .    Jeri Modena 12/15/2019, 3:44 PM

## 2019-12-15 NOTE — Progress Notes (Signed)
[facility] Progress Note  Patient: Andre Rowe    ZOX:[id number]  DOA: 11/27/2019     Date of Service: the patient was seen and examined on 12/15/2019  Chief Complaint  Patient presents with  . Fall   Brief hospital course: As per the H and P dictated on admission, "Ashtin Pritchettis a 79 y.o.malewith a history of schizophrenia, dementia, type 2 diabetes, hypertension, hyperlipidemia. Patient currently lives at [facility]. As the patient has dementia and schizophrenia, the patient is unable to give history. History is obtained from caregiver. Patient has been having a gradual decline over the past week, but a more sudden decline over the past 12 hours. Since today, the patient has needed a fair amount assistance in walking as he is unable to balance. This is a new change. The patient had 2 falls earlier today: The first in the shower, then on the porch while smoking. Patient hit his head and left shoulder on the second fall. He was brought to the hospital for evaluation.   Patient does appear to have fairly significant dementia and does not have insight or judgment. Per the patient's caregiver, the patient has no meaningful relationship and does not have a power of attorney or guardian."  Currently further plan is patient awaiting state representative for discharge paperwork.  Assessment and Plan: Ataxia. Ambulatory dysfunction. Recurrent fall. Patient is from a group home was brought to the hospital due to acute decline on top of patient's progressive decline. Patient had 2 falls during the day on day of admission. MRI brain was performed which was negative for any acute stroke or abnormality. PT OT was consulted. Patient after initial refusal is agreeable to work with therapy currently requiring SNF level of care to improve mobility enough that he can go back to group home.  Schizophrenia. On Tegretol, Klonopin and Zyprexa at home. Continue home regimen  for now.  Essential hypertension. Blood pressure stable. Continue current regimen.  History of subdural hematoma. CT scan unremarkable.  Unspecified dementia with behavioral disturbance continue supportive care  Type 2 diabetes mellitus without complication. Controlled. Hemoglobin A1c 7.6. Continue current insulin regimen .   Body mass index is 33.41 kg/m.  Nutrition Problem: Inadequate oral intake Etiology: poor appetite Interventions: Interventions: Ensure Enlive (each supplement provides 350kcal and 20 grams of protein), MVI  Diet: Carb modified diet DVT Prophylaxis: Subcutaneous Heparin    Advance goals of care discussion: Full code  Family Communication: no family   Disposition:  Status is: Inpatient  Remains inpatient appropriate because:Unsafe d/c plan   Dispo: The patient is from: Group home              Anticipated d/c is to: SNF              Anticipated d/c date is: when bed available.              Patient currently is medically stable to d/c.  Subjective: No overnight events.    Physical Exam: Physical Exam  Constitutional: He is well-developed, well-nourished, and in no distress.  HENT:  Head: Atraumatic.  Cardiovascular: Normal rate.  Psychiatric:  Prefers to lay in the bed.  Not in any distress.  He is alert but oriented x0.    Vitals:   12/14/19 2013 12/15/19 0000 12/15/19 0400 12/15/19 0750  BP: 125/73 127/88 (!) 104/59 (P) 105/71  Pulse: 81 82 81 (P) 94  Resp: 17 16 16  (P) 14  Temp:  (!) 97.5 F (36.4 C)  98.3 F (36.8 C) (P) 98.3 F (36.8 C)  TempSrc:  Axillary Axillary (P) Oral  SpO2: 96% 94% 95% (P) 100%  Weight:      Height:        Intake/Output Summary (Last 24 hours) at 12/15/2019 1109 Last data filed at 12/15/2019 0900 Gross per 24 hour  Intake 1040 ml  Output 1500 ml  Net -460 ml   Filed Weights   11/27/19 1443  Weight: 93.9 kg    Data Reviewed: I have personally reviewed and interpreted daily labs, tele  strips, imagings as discussed above. I reviewed all nursing notes, pharmacy notes, vitals, pertinent old records I have discussed plan of care as described above with RN and patient/family.  CBC: Recent Labs  Lab 12/09/19 0559  WBC 4.9  NEUTROABS 1.6*  HGB 12.4*  HCT 38.6*  MCV 107.5*  PLT 202   Basic Metabolic Panel: Recent Labs  Lab 12/09/19 0559  NA 141  K 4.4  CL 106  CO2 27  GLUCOSE 127*  BUN 23  CREATININE 1.12  CALCIUM 8.9  MG 2.0    Studies: No results found.  Scheduled Meds: . carbamazepine  200 mg Oral TID  . clonazePAM  0.5 mg Oral TID  . donepezil  10 mg Oral QHS  . enoxaparin (LOVENOX) injection  40 mg Subcutaneous Daily  . feeding supplement (ENSURE ENLIVE)  237 mL Oral TID BM  . insulin aspart  0-6 Units Subcutaneous TID WC  . insulin detemir  18 Units Subcutaneous q morning - 10a  . latanoprost  1 drop Both Eyes QHS  . memantine  10 mg Oral BID  . multivitamin with minerals  1 tablet Oral Daily  . OLANZapine  10 mg Oral QHS  . oxybutynin  5 mg Oral BID  . simvastatin  40 mg Oral Daily  . tamsulosin  0.4 mg Oral Daily  . thiamine  100 mg Oral Daily   Continuous Infusions: PRN Meds: acetaminophen, bisacodyl, ondansetron **OR** ondansetron (ZOFRAN) IV, polyethylene glycol  Time spent: 25 minutes

## 2019-12-16 DIAGNOSIS — R270 Ataxia, unspecified: Secondary | ICD-10-CM | POA: Diagnosis not present

## 2019-12-16 LAB — GLUCOSE, CAPILLARY
Glucose-Capillary: 110 mg/dL — ABNORMAL HIGH (ref 70–99)
Glucose-Capillary: 237 mg/dL — ABNORMAL HIGH (ref 70–99)
Glucose-Capillary: 266 mg/dL — ABNORMAL HIGH (ref 70–99)
Glucose-Capillary: 183 mg/dL — ABNORMAL HIGH (ref 70–99)

## 2019-12-16 NOTE — Plan of Care (Signed)

## 2019-12-16 NOTE — Progress Notes (Signed)
[facility] Progress Note  Patient: Andre Rowe    ALP:[id number]  DOA: 11/27/2019     Date of Service: the patient was seen and examined on 12/16/2019  Chief Complaint  Patient presents with  . Fall   Brief hospital course: As per the H and P dictated on admission, "Andre Pritchettis a 79 y.o.malewith a history of schizophrenia, dementia, type 2 diabetes, hypertension, hyperlipidemia. Patient currently lives at [facility] [place] [facility]. As the patient has dementia and schizophrenia, the patient is unable to give history. History is obtained from caregiver. Patient has been having a gradual decline over the past week, but a more sudden decline over the past 12 hours. Since today, the patient has needed a fair amount assistance in walking as he is unable to balance. This is a new change. The patient had 2 falls earlier today: The first in the shower, then on the porch while smoking. Patient hit his head and left shoulder on the second fall. He was brought to the hospital for evaluation.   Patient does appear to have fairly significant dementia and does not have insight or judgment. Per the patient's caregiver, the patient has no meaningful relationship and does not have a power of attorney or guardian."  Currently further plan is patient awaiting state representative for discharge paperwork.  Assessment and Plan: Ataxia. Ambulatory dysfunction. Recurrent fall. Patient is from a group home was brought to the hospital due to acute decline on top of patient's progressive decline. Patient had 2 falls during the day on day of admission. MRI brain was performed which was negative for any acute stroke or abnormality. PT OT was consulted. Patient after initial refusal is agreeable to work with therapy currently requiring SNF level of care to improve mobility enough that he can go back to group home.  Schizophrenia. On Tegretol, Klonopin and Zyprexa at home. Continue home regimen  for now.  Essential hypertension. Blood pressure stable. Continue current regimen.  History of subdural hematoma. CT scan unremarkable.  Unspecified dementia with behavioral disturbance continue supportive care  Type 2 diabetes mellitus without complication. Controlled. Hemoglobin A1c 7.6. Continue current insulin regimen .   Body mass index is 33.41 kg/m.  Nutrition Problem: Inadequate oral intake Etiology: poor appetite Interventions: Interventions: Ensure Enlive (each supplement provides 350kcal and 20 grams of protein), MVI  Diet: Carb modified diet DVT Prophylaxis: Subcutaneous Heparin    Advance goals of care discussion: Full code  Family Communication: no family   Disposition:  Status is: Inpatient  Remains inpatient appropriate because:Unsafe d/c plan   Dispo: The patient is from: Group home              Anticipated d/c is to: SNF              Anticipated d/c date is: when bed available.              Patient currently is medically stable to d/c.  Subjective: No overnight events.  Sitting in couch and eating breakfast.   Physical Exam: Physical Exam  Constitutional: He is well-developed, well-nourished, and in no distress.  HENT:  Head: Atraumatic.  Cardiovascular: Normal rate.  Psychiatric:  Comfortable.  Alert but not oriented.  Pleasant without any behavior disturbances.    Vitals:   12/15/19 2010 12/15/19 2357 12/16/19 0401 12/16/19 0821  BP: 108/66 112/69 118/70 (!) 141/87  Pulse: 77 75 81 77  Resp: 18 18 18 18   Temp: 98 F (36.7 C) 98.2 F (36.8 C) 98  F (36.7 C) 98.2 F (36.8 C)  TempSrc: Oral Oral Oral Oral  SpO2:  100% 100% 99%  Weight:      Height:        Intake/Output Summary (Last 24 hours) at 12/16/2019 1057 Last data filed at 12/16/2019 0402 Gross per 24 hour  Intake 660 ml  Output 2000 ml  Net -1340 ml   Filed Weights   11/27/19 1443  Weight: 93.9 kg    Data Reviewed: I have personally reviewed and interpreted  daily labs, tele strips, imagings as discussed above. I reviewed all nursing notes, pharmacy notes, vitals, pertinent old records I have discussed plan of care as described above with RN and patient/family.  CBC: No results for input(s): WBC, NEUTROABS, HGB, HCT, MCV, PLT in the last 168 hours. Basic Metabolic Panel: No results for input(s): NA, K, CL, CO2, GLUCOSE, BUN, CREATININE, CALCIUM, MG, PHOS in the last 168 hours.  Studies: No results found.  Scheduled Meds: . carbamazepine  200 mg Oral TID  . clonazePAM  0.5 mg Oral TID  . donepezil  10 mg Oral QHS  . enoxaparin (LOVENOX) injection  40 mg Subcutaneous Daily  . feeding supplement (ENSURE ENLIVE)  237 mL Oral TID BM  . insulin aspart  0-6 Units Subcutaneous TID WC  . insulin detemir  18 Units Subcutaneous q morning - 10a  . latanoprost  1 drop Both Eyes QHS  . memantine  10 mg Oral BID  . multivitamin with minerals  1 tablet Oral Daily  . OLANZapine  10 mg Oral QHS  . oxybutynin  5 mg Oral BID  . simvastatin  40 mg Oral Daily  . tamsulosin  0.4 mg Oral Daily  . thiamine  100 mg Oral Daily   Continuous Infusions: PRN Meds: acetaminophen, bisacodyl, ondansetron **OR** ondansetron (ZOFRAN) IV, polyethylene glycol  Time spent: 25 minutes

## 2019-12-16 NOTE — Progress Notes (Signed)
Physical Therapy Treatment Patient Details Name: Andre Rowe MRN: [id number] DOB: 04-05-1941 Today's Date: 12/16/2019    History of Present Illness Andre Rowe is a 79 y.o. male admitted 11/27/19 s/p x2 falls hitting head and shoulder with generalized weakness. CT C spine- multilvel degenerative changes, no fx. CT head: negative. CT shoulder negative. MRI brain no acute changes.  PMHx: schizophrenia, dementia, T2DM, HTN, HLD. Patient currently lives at [facility]    PT Comments    Patient in bed upon arrival and agreeable to participate in therapy. Pt able to ambulate 25 ft with RW and mod A (+2 for safety/chair follow). Continue to progress as tolerated with anticipated d/c to SNF for further skilled PT services.     Follow Up Recommendations  SNF;Supervision/Assistance - 24 hour     Equipment Recommendations  Other (comment)(TBD next venue)    Recommendations for Other Services       Precautions / Restrictions Precautions Precautions: Fall Restrictions Weight Bearing Restrictions: No    Mobility  Bed Mobility Overal bed mobility: Needs Assistance Bed Mobility: Sidelying to Sit   Sidelying to sit: Mod assist       General bed mobility comments: assist to bring bilat LE to EOB and to elevate trunk into sitting  Transfers Overall transfer level: Needs assistance Equipment used: Rolling walker (2 wheeled) Transfers: Sit to/from Stand Sit to Stand: Mod assist;+2 physical assistance;+2 safety/equipment         General transfer comment: cues for hand placement; assistance for L hand placement on RW   Ambulation/Gait Ambulation/Gait assistance: Mod assist;+2 safety/equipment(chair follow ) Gait Distance (Feet): 25 Feet Assistive device: Rolling walker (2 wheeled) Gait Pattern/deviations: Step-to pattern;Step-through pattern;Decreased step length - right;Decreased step length - left;Trunk flexed Gait velocity: decreased   General Gait Details:  multimodal cues for upright posture; vc for increased bilat step lengths; pt with flexed knees and trunk throughout; assistance for balance and managing RW    Stairs             Wheelchair Mobility    Modified Rankin (Stroke Patients Only)       Balance Overall balance assessment: History of Falls;Needs assistance Sitting-balance support: Feet supported Sitting balance-Leahy Scale: Fair   Postural control: Left lateral lean Standing balance support: Bilateral upper extremity supported Standing balance-Leahy Scale: Poor Standing balance comment: left lean in standing                            Cognition Arousal/Alertness: Awake/alert Behavior During Therapy: Flat affect Overall Cognitive Status: No family/caregiver present to determine baseline cognitive functioning                                 General Comments: pt saying "okay" when given cues but did not initiate mobility; pt assists in mobility tasks when initiated by therapist      Exercises      General Comments        Pertinent Vitals/Pain Pain Assessment: No/denies pain    Home Living                      Prior Function            PT Goals (current goals can now be found in the care plan section) Progress towards PT goals: Progressing toward goals    Frequency    Min 2X/week  PT Plan Current plan remains appropriate    Co-evaluation              AM-PAC PT "6 Clicks" Mobility   Outcome Measure  Help needed turning from your back to your side while in a flat bed without using bedrails?: A Lot Help needed moving from lying on your back to sitting on the side of a flat bed without using bedrails?: A Lot Help needed moving to and from a bed to a chair (including a wheelchair)?: A Lot Help needed standing up from a chair using your arms (e.g., wheelchair or bedside chair)?: A Lot Help needed to walk in hospital room?: A Lot Help needed climbing  3-5 steps with a railing? : Total 6 Click Score: 11    End of Session Equipment Utilized During Treatment: Gait belt Activity Tolerance: Patient tolerated treatment well Patient left: in chair;with chair alarm set;with call bell/phone within reach Nurse Communication: Mobility status PT Visit Diagnosis: Unsteadiness on feet (R26.81);Other symptoms and signs involving the nervous system (R29.898);Difficulty in walking, not elsewhere classified (R26.2);Muscle weakness (generalized) (M62.81)     Time: 8850-2774 PT Time Calculation (min) (ACUTE ONLY): 18 min  Charges:  $Gait Training: 8-22 mins                     Erline Levine, PTA Acute Rehabilitation Services Pager: [phone] Office: [phone]     Carolynne Edouard 12/16/2019, 11:43 AM

## 2019-12-16 NOTE — Progress Notes (Signed)
Nutrition Follow-up  DOCUMENTATION CODES:   Not applicable  INTERVENTION:  Continue Ensure Enlive po TID  Continue MVI daily  NUTRITION DIAGNOSIS:   Inadequate oral intake related to poor appetite as evidenced by meal completion < 50%.  Ongoing.  GOAL:   Patient will meet greater than or equal to 90% of their needs  Progressing.   MONITOR:   PO intake, Supplement acceptance, Labs, Weight trends, I & O's  REASON FOR ASSESSMENT:   LOS    ASSESSMENT:   Pt with a PMH significant for schizophrenia, dementia, T2DM, HTN, and HLD who presented from group home s/p fall.  Pt sleeping during RD visit and did not wake to RD voice.   Discussed pt with RN.   Pt currently receiving Ensure Enlive TID. Per RN, pt drinks ~50% of each supplement.  PO Intake: 25-100% x last 8 recorded meals (75% average meal intake)  UOP: 2,446ml x24 hours I/O: -11,436.71ml since admit  Labs: CBGs 110-286 (Diabetes Coordinator following) Medications reviewed and include: Novolog, Levemir, MVI, Thiamine   NUTRITION - FOCUSED PHYSICAL EXAM:  Deferred; will re-attempt at follow-up.  Diet Order:   Diet Order            Diet - low sodium heart healthy        Diet Carb Modified Fluid consistency: Thin; Room service appropriate? Yes  Diet effective now              EDUCATION NEEDS:   Not appropriate for education at this time  Skin:  Skin Assessment: Reviewed RN Assessment  Last BM:  4/28 type 6  Height:   Ht Readings from Last 1 Encounters:  11/27/19 5\' 6"  (1.676 m)    Weight:   Wt Readings from Last 1 Encounters:  11/27/19 93.9 kg    BMI:  Body mass index is 33.41 kg/m.  Estimated Nutritional Needs:   Kcal:  1800-2000  Protein:  110-125 grams  Fluid:  >/= 1.8L   01/27/20, MS, RD, LDN RD pager number and weekend/on-call pager number located in Amion.

## 2019-12-17 DIAGNOSIS — R270 Ataxia, unspecified: Secondary | ICD-10-CM | POA: Diagnosis not present

## 2019-12-17 LAB — GLUCOSE, CAPILLARY
Glucose-Capillary: 116 mg/dL — ABNORMAL HIGH (ref 70–99)
Glucose-Capillary: 257 mg/dL — ABNORMAL HIGH (ref 70–99)
Glucose-Capillary: 174 mg/dL — ABNORMAL HIGH (ref 70–99)
Glucose-Capillary: 151 mg/dL — ABNORMAL HIGH (ref 70–99)

## 2019-12-17 NOTE — Progress Notes (Signed)
Occupational Therapy Treatment Patient Details Name: Andre Rowe MRN: [id number] DOB: 03/11/1941 Today's Date: 12/17/2019    History of present illness Andre Rowe is a 79 y.o. male admitted 11/27/19 s/p x2 falls hitting head and shoulder with generalized weakness. CT C spine- multilvel degenerative changes, no fx. CT head: negative. CT shoulder negative. MRI brain no acute changes.  PMHx: schizophrenia, dementia, T2DM, HTN, HLD. Patient currently lives at [facility]   OT comments  Pt. Seen for skilled OT treatment session.  Able to complete several grooming tasks bed level with one set commands.  Good initiation and able to complete with out additional cues.  Will continue to progress with ADLS next session.    Follow Up Recommendations  SNF    Equipment Recommendations  Wheelchair (measurements OT);Wheelchair cushion (measurements OT);Hospital bed    Recommendations for Other Services      Precautions / Restrictions Precautions Precautions: Fall       Mobility Bed Mobility                  Transfers                      Balance                                           ADL either performed or assessed with clinical judgement   ADL Overall ADL's : Needs assistance/impaired     Grooming: Wash/dry face;Oral care Grooming Details (indicate cue type and reason): able to bring wash cloth to face and wash thoroughly with one step cues.  able to use mouth swab to clean around all gums without cueing. applied lip ointment without cues with set up. declined need for hand lotion "im fine"                               General ADL Comments: able to perform several grooming tasks bed level with one step cues     Vision       Perception     Praxis      Cognition Arousal/Alertness: Awake/alert Behavior During Therapy: Flat affect Overall Cognitive Status: No family/caregiver present to determine baseline  cognitive functioning                                          Exercises     Shoulder Instructions       General Comments      Pertinent Vitals/ Pain       Pain Assessment: No/denies pain  Home Living                                          Prior Functioning/Environment              Frequency  Min 2X/week        Progress Toward Goals  OT Goals(current goals can now be found in the care plan section)  Progress towards OT goals: Progressing toward goals     Plan Discharge plan remains appropriate    Co-evaluation  AM-PAC OT "6 Clicks" Daily Activity     Outcome Measure   Help from another person eating meals?: A Little Help from another person taking care of personal grooming?: A Lot Help from another person toileting, which includes using toliet, bedpan, or urinal?: Total Help from another person bathing (including washing, rinsing, drying)?: Total Help from another person to put on and taking off regular upper body clothing?: Total Help from another person to put on and taking off regular lower body clothing?: Total 6 Click Score: 9    End of Session    OT Visit Diagnosis: Unsteadiness on feet (R26.81);Muscle weakness (generalized) (M62.81)   Activity Tolerance Patient tolerated treatment well   Patient Left in bed;with call bell/phone within reach;with bed alarm set   Nurse Communication          Time: 2035-5974 OT Time Calculation (min): 8 min  Charges: OT General Charges $OT Visit: 1 Visit OT Treatments $Self Care/Home Management : 8-22 mins  Boneta Lucks, COTA/L Acute Rehabilitation [phone]   Robet Leu 12/17/2019, 12:01 PM

## 2019-12-17 NOTE — Progress Notes (Signed)
[facility] Progress Note  Patient: Andre Rowe    GEZ:[id number]  DOA: 11/27/2019     Date of Service: the patient was seen and examined on 12/17/2019  Chief Complaint  Patient presents with  . Fall   Brief hospital course: As per the H and P dictated on admission, "Andre Pritchettis a 79 y.o.malewith a history of schizophrenia, dementia, type 2 diabetes, hypertension, hyperlipidemia. Patient currently lives at [facility]. As the patient has dementia and schizophrenia, the patient is unable to give history. History is obtained from caregiver. Patient has been having a gradual decline over the past week, but a more sudden decline over the past 12 hours. Since today, the patient has needed a fair amount assistance in walking as he is unable to balance. This is a new change. The patient had 2 falls earlier today: The first in the shower, then on the porch while smoking. Patient hit his head and left shoulder on the second fall. He was brought to the hospital for evaluation.   Patient does appear to have fairly significant dementia and does not have insight or judgment. Per the patient's caregiver, the patient has no meaningful relationship and does not have a power of attorney or guardian."  Currently further plan is patient awaiting state representative for discharge paperwork.  Assessment and Plan: Ataxia. Ambulatory dysfunction. Recurrent fall. Patient is from a group home was brought to the hospital due to acute decline on top of patient's progressive decline. Patient had 2 falls during the day on day of admission. MRI brain was performed which was negative for any acute stroke or abnormality. PT OT was consulted. Patient after initial refusal is agreeable to work with therapy currently requiring SNF level of care to improve mobility enough that he can go back to group home.  Schizophrenia. On Tegretol, Klonopin and Zyprexa at home. Continue home regimen  for now.  Essential hypertension. Blood pressure stable. Continue current regimen.  History of subdural hematoma. CT scan unremarkable.  Unspecified dementia with behavioral disturbance continue supportive care  Type 2 diabetes mellitus without complication. Controlled. Hemoglobin A1c 7.6. Continue current insulin regimen .   Body mass index is 33.41 kg/m.  Nutrition Problem: Inadequate oral intake Etiology: poor appetite Interventions: Interventions: Ensure Enlive (each supplement provides 350kcal and 20 grams of protein), MVI  Diet: Carb modified diet DVT Prophylaxis: Subcutaneous Heparin    Advance goals of care discussion: Full code  Family Communication: no family   Disposition:  Status is: Inpatient  Remains inpatient appropriate because:Unsafe d/c plan   Dispo: The patient is from: Group home              Anticipated d/c is to: SNF              Anticipated d/c date is: when bed available.              Patient currently is medically stable to d/c.  Subjective: No overnight events.  Nursing reported no change in status or new needs.   Physical Exam: Physical Exam  Constitutional: He is well-developed, well-nourished, and in no distress.  HENT:  Head: Atraumatic.  Cardiovascular: Normal rate.  Psychiatric:  Comfortable.  Alert but not oriented.  Pleasant without any behavior disturbances.    Vitals:   12/16/19 1946 12/17/19 0021 12/17/19 0406 12/17/19 0743  BP: 126/80 127/79 121/73 129/79  Pulse: 72 83 75 88  Resp: 16 18 18 18   Temp: (!) 97.5 F (36.4 C) 98.5 F (  36.9 C) 98 F (36.7 C) 98.2 F (36.8 C)  TempSrc: Oral Oral Oral Oral  SpO2: 100% 98% 97% 99%  Weight:      Height:        Intake/Output Summary (Last 24 hours) at 12/17/2019 1048 Last data filed at 12/17/2019 0700 Gross per 24 hour  Intake 880 ml  Output 714 ml  Net 166 ml   Filed Weights   11/27/19 1443  Weight: 93.9 kg    Data Reviewed: I have personally reviewed  and interpreted daily labs, tele strips, imagings as discussed above. I reviewed all nursing notes, pharmacy notes, vitals, pertinent old records I have discussed plan of care as described above with RN and patient/family.  CBC: No results for input(s): WBC, NEUTROABS, HGB, HCT, MCV, PLT in the last 168 hours. Basic Metabolic Panel: No results for input(s): NA, K, CL, CO2, GLUCOSE, BUN, CREATININE, CALCIUM, MG, PHOS in the last 168 hours.  Studies: No results found.  Scheduled Meds: . carbamazepine  200 mg Oral TID  . clonazePAM  0.5 mg Oral TID  . donepezil  10 mg Oral QHS  . enoxaparin (LOVENOX) injection  40 mg Subcutaneous Daily  . feeding supplement (ENSURE ENLIVE)  237 mL Oral TID BM  . insulin aspart  0-6 Units Subcutaneous TID WC  . insulin detemir  18 Units Subcutaneous q morning - 10a  . latanoprost  1 drop Both Eyes QHS  . memantine  10 mg Oral BID  . multivitamin with minerals  1 tablet Oral Daily  . OLANZapine  10 mg Oral QHS  . oxybutynin  5 mg Oral BID  . simvastatin  40 mg Oral Daily  . tamsulosin  0.4 mg Oral Daily  . thiamine  100 mg Oral Daily   Continuous Infusions: PRN Meds: acetaminophen, bisacodyl, ondansetron **OR** ondansetron (ZOFRAN) IV, polyethylene glycol  Time spent: 25 minutes

## 2019-12-17 NOTE — TOC Progression Note (Signed)
Transition of Care [facility]) - Progression Note    Patient Details  Name: Andre Rowe MRN: [id number] Date of Birth: 21-Nov-1940  [facility] CM/SW Contact  Baldemar Lenis, [place] Phone Number: 12/17/2019, 3:38 PM  Clinical Narrative:   CSW contacted by Irine Seal with [facility] DSS that the patient has been reassigned to them, as he has been residing in [facility]. Judeth Cornfield requested updated medical records, and indicated that they are filing an expedited guardian request to get an interim guardian assigned to sign the patient into SNF ASAP. Judeth Cornfield will update CSW when that process is completed, could be as early as Monday. CSW sent Judeth Cornfield updated MD note and updated MAR. CSW to follow.    Expected Discharge Plan: Skilled Nursing Facility Barriers to Discharge: Family Issues, Other (comment)(Needs guardian)  Expected Discharge Plan and Services Expected Discharge Plan: Skilled Nursing Facility     Post Acute Care Choice: Skilled Nursing Facility Living arrangements for the past 2 months: Group Home Expected Discharge Date: 12/09/19                                     Social Determinants of Health (SDOH) Interventions    Readmission Risk Interventions No flowsheet data found.

## 2019-12-18 LAB — GLUCOSE, CAPILLARY
Glucose-Capillary: 110 mg/dL — ABNORMAL HIGH (ref 70–99)
Glucose-Capillary: 135 mg/dL — ABNORMAL HIGH (ref 70–99)
Glucose-Capillary: 241 mg/dL — ABNORMAL HIGH (ref 70–99)
Glucose-Capillary: 193 mg/dL — ABNORMAL HIGH (ref 70–99)
Glucose-Capillary: 107 mg/dL — ABNORMAL HIGH (ref 70–99)
Glucose-Capillary: 55 mg/dL — ABNORMAL LOW (ref 70–99)

## 2019-12-18 NOTE — Progress Notes (Signed)
[facility] Progress Note  Patient: Andre Rowe    VPX:[id number]  DOA: 11/27/2019     Date of Service: the patient was seen and examined on 12/18/2019  Chief Complaint  Patient presents with  . Fall   Brief hospital course: As per the H and P dictated on admission, "Andre Pritchettis a 79 y.o.malewith a history of schizophrenia, dementia, type 2 diabetes, hypertension, hyperlipidemia. Patient currently lives at [facility]. As the patient has dementia and schizophrenia, the patient is unable to give history. History is obtained from caregiver. Patient has been having a gradual decline over the past week, but a more sudden decline over the past 12 hours. Since today, the patient has needed a fair amount assistance in walking as he is unable to balance. This is a new change. The patient had 2 falls earlier today: The first in the shower, then on the porch while smoking. Patient hit his head and left shoulder on the second fall. He was brought to the hospital for evaluation.   Patient does appear to have fairly significant dementia and does not have insight or judgment. Per the patient's caregiver, the patient has no meaningful relationship and does not have a power of attorney or guardian."  Currently further plan is patient awaiting state representative for discharge paperwork.  Assessment and Plan: Ataxia. Ambulatory dysfunction. Recurrent fall. Patient is from a group home was brought to the hospital due to acute decline on top of patient's progressive decline. Patient had 2 falls during the day on day of admission. MRI brain was performed which was negative for any acute stroke or abnormality. PT OT was consulted. Patient after initial refusal is agreeable to work with therapy currently requiring SNF level of care to improve mobility enough that he can go back to group home.  Schizophrenia. On Tegretol, Klonopin and Zyprexa at home. Continue home regimen  for now.  Essential hypertension. Blood pressure stable. Continue current regimen.  History of subdural hematoma. CT scan unremarkable.  Unspecified dementia with behavioral disturbance continue supportive care  Type 2 diabetes mellitus without complication. Controlled. Hemoglobin A1c 7.6. Continue current insulin regimen .   Body mass index is 33.41 kg/m.  Nutrition Problem: Inadequate oral intake Etiology: poor appetite Interventions: Interventions: Ensure Enlive (each supplement provides 350kcal and 20 grams of protein), MVI  Diet: Carb modified diet DVT Prophylaxis: Subcutaneous Heparin    Advance goals of care discussion: Full code  Family Communication: no family   Disposition:  Status is: Inpatient  Remains inpatient appropriate because:Unsafe d/c plan   Dispo: The patient is from: Group home              Anticipated d/c is to: SNF              Anticipated d/c date is: when bed available.              Patient currently is medically stable to d/c.  Subjective: Seen and examined.  No overnight events.  No change in the status.   Physical Exam: Physical Exam  Constitutional: He is well-developed, well-nourished, and in no distress.  HENT:  Head: Atraumatic.  Cardiovascular: Normal rate.  Psychiatric:  Comfortable.  Alert but not oriented.  Pleasant without any behavior disturbances.    Vitals:   12/17/19 2021 12/18/19 0044 12/18/19 0452 12/18/19 0751  BP: 114/65 107/68 140/72 126/68  Pulse: 79 85 65 71  Resp: 16 20 16 20   Temp: 98.2 F (36.8 C) 98 F (36.7  C) (!) 97.5 F (36.4 C) 97.9 F (36.6 C)  TempSrc: Oral Oral Oral Axillary  SpO2: 93% 95% 99% 97%  Weight:      Height:        Intake/Output Summary (Last 24 hours) at 12/18/2019 1045 Last data filed at 12/18/2019 0656 Gross per 24 hour  Intake 480 ml  Output 1150 ml  Net -670 ml   Filed Weights   11/27/19 1443  Weight: 93.9 kg    Data Reviewed: I have personally reviewed and  interpreted daily labs, tele strips, imagings as discussed above. I reviewed all nursing notes, pharmacy notes, vitals, pertinent old records I have discussed plan of care as described above with RN and patient/family.  CBC: No results for input(s): WBC, NEUTROABS, HGB, HCT, MCV, PLT in the last 168 hours. Basic Metabolic Panel: No results for input(s): NA, K, CL, CO2, GLUCOSE, BUN, CREATININE, CALCIUM, MG, PHOS in the last 168 hours.  Studies: No results found.  Scheduled Meds: . carbamazepine  200 mg Oral TID  . clonazePAM  0.5 mg Oral TID  . donepezil  10 mg Oral QHS  . enoxaparin (LOVENOX) injection  40 mg Subcutaneous Daily  . feeding supplement (ENSURE ENLIVE)  237 mL Oral TID BM  . insulin aspart  0-6 Units Subcutaneous TID WC  . insulin detemir  18 Units Subcutaneous q morning - 10a  . latanoprost  1 drop Both Eyes QHS  . memantine  10 mg Oral BID  . multivitamin with minerals  1 tablet Oral Daily  . OLANZapine  10 mg Oral QHS  . oxybutynin  5 mg Oral BID  . simvastatin  40 mg Oral Daily  . tamsulosin  0.4 mg Oral Daily  . thiamine  100 mg Oral Daily   Continuous Infusions: PRN Meds: acetaminophen, bisacodyl, ondansetron **OR** ondansetron (ZOFRAN) IV, polyethylene glycol  Time spent: 20 minutes

## 2019-12-18 NOTE — Progress Notes (Signed)
Patient's CBG was 55 without hypoglycemic symptoms. Hypoglycemic protocol initiated and patient given juice and CBG was rechecked.  Second CBG 107 and patient resting comfortably in bed.

## 2019-12-19 LAB — GLUCOSE, CAPILLARY
Glucose-Capillary: 108 mg/dL — ABNORMAL HIGH (ref 70–99)
Glucose-Capillary: 111 mg/dL — ABNORMAL HIGH (ref 70–99)
Glucose-Capillary: 277 mg/dL — ABNORMAL HIGH (ref 70–99)
Glucose-Capillary: 118 mg/dL — ABNORMAL HIGH (ref 70–99)

## 2019-12-19 MED ORDER — INSULIN DETEMIR 100 UNIT/ML ~~LOC~~ SOLN
15.0000 [IU] | Freq: Every morning | SUBCUTANEOUS | Status: DC
  Administered 2019-12-19 – 2019-12-21 (×3): 15 [IU] via SUBCUTANEOUS
  Filled 2019-12-19 (×3): qty 0.15

## 2019-12-19 NOTE — Progress Notes (Signed)
[facility] Progress Note  Patient: Andre Rowe    NKN:[id number]  DOA: 11/27/2019     Date of Service: the patient was seen and examined on 12/19/2019  Chief Complaint  Patient presents with  . Fall   Brief hospital course: As per the H and P dictated on admission, "Andre Rowe a 79 y.o.malewith a history of schizophrenia, dementia, type 2 diabetes, hypertension, hyperlipidemia. Patient currently lives at [facility]. As the patient has dementia and schizophrenia, the patient is unable to give history. History is obtained from caregiver. Patient has been having a gradual decline over the past week, but a more sudden decline over the past 12 hours. Since today, the patient has needed a fair amount assistance in walking as he is unable to balance. This is a new change. The patient had 2 falls earlier today: The first in the shower, then on the porch while smoking. Patient hit his head and left shoulder on the second fall. He was brought to the hospital for evaluation.   Patient does appear to have fairly significant dementia and does not have insight or judgment. Per the patient's caregiver, the patient has no meaningful relationship and does not have a power of attorney or guardian."  Currently further plan is patient awaiting state representative for discharge paperwork.  Assessment and Plan: Ataxia. Ambulatory dysfunction. Recurrent fall. Patient is from a group home was brought to the hospital due to acute decline on top of patient's progressive decline. Patient had 2 falls during the day on day of admission. MRI brain was performed which was negative for any acute stroke or abnormality. PT OT was consulted. Patient after initial refusal is agreeable to work with therapy currently requiring SNF level of care to improve mobility enough that he can go back to group home.  Schizophrenia. On Tegretol, Klonopin and Zyprexa at home. Continue home regimen  for now.  Essential hypertension. Blood pressure stable. Continue current regimen.  History of subdural hematoma. CT scan unremarkable.  Unspecified dementia with behavioral disturbance continue supportive care  Type 2 diabetes mellitus without complication. Controlled. Hemoglobin A1c 7.6. Noted hypoglycemic events last night.  Decrease dose of Levemir today.  Body mass index is 33.41 kg/m.  Nutrition Problem: Inadequate oral intake Etiology: poor appetite Interventions: Interventions: Ensure Enlive (each supplement provides 350kcal and 20 grams of protein), MVI  Diet: Carb modified diet DVT Prophylaxis: Subcutaneous Heparin    Advance goals of care discussion: Full code  Family Communication: no family   Disposition:  Status is: Inpatient  Remains inpatient appropriate because:Unsafe d/c plan   Dispo: The patient is from: Group home              Anticipated d/c is to: SNF              Anticipated d/c date is: when bed available.              Patient currently is medically stable to d/c.  Subjective: Seen and examined.  Easily he will be eating breakfast, today he was more sleepy. Reported blood sugars 55 last night.  Will decrease doses of insulin.    Physical Exam: Physical Exam  Constitutional: He is well-developed, well-nourished, and in no distress.  HENT:  Head: Atraumatic.  Cardiovascular: Normal rate.  Psychiatric:  Comfortable.  Alert but not oriented.  Pleasant without any behavior disturbances.    Vitals:   12/18/19 2057 12/19/19 0026 12/19/19 0439 12/19/19 0745  BP: 122/73 122/69 (!) 153/84 107/70  Pulse: 74 69 (!) 59 76  Resp: 16 20 18 16   Temp: 97.7 F (36.5 C) 98.5 F (36.9 C) 97.9 F (36.6 C) 98.5 F (36.9 C)  TempSrc: Oral Oral Oral Oral  SpO2: 99% 100% 99% 98%  Weight:      Height:        Intake/Output Summary (Last 24 hours) at 12/19/2019 1155 Last data filed at 12/19/2019 1029 Gross per 24 hour  Intake 350 ml  Output  1725 ml  Net -1375 ml   Filed Weights   11/27/19 1443  Weight: 93.9 kg    Data Reviewed: I have personally reviewed and interpreted daily labs, tele strips, imagings as discussed above. I reviewed all nursing notes, pharmacy notes, vitals, pertinent old records I have discussed plan of care as described above with RN and patient/family.  CBC: No results for input(s): WBC, NEUTROABS, HGB, HCT, MCV, PLT in the last 168 hours. Basic Metabolic Panel: No results for input(s): NA, K, CL, CO2, GLUCOSE, BUN, CREATININE, CALCIUM, MG, PHOS in the last 168 hours.  Studies: No results found.  Scheduled Meds: . carbamazepine  200 mg Oral TID  . clonazePAM  0.5 mg Oral TID  . donepezil  10 mg Oral QHS  . enoxaparin (LOVENOX) injection  40 mg Subcutaneous Daily  . feeding supplement (ENSURE ENLIVE)  237 mL Oral TID BM  . insulin aspart  0-6 Units Subcutaneous TID WC  . insulin detemir  15 Units Subcutaneous q morning - 10a  . latanoprost  1 drop Both Eyes QHS  . memantine  10 mg Oral BID  . multivitamin with minerals  1 tablet Oral Daily  . OLANZapine  10 mg Oral QHS  . oxybutynin  5 mg Oral BID  . simvastatin  40 mg Oral Daily  . tamsulosin  0.4 mg Oral Daily  . thiamine  100 mg Oral Daily   Continuous Infusions: PRN Meds: acetaminophen, bisacodyl, ondansetron **OR** ondansetron (ZOFRAN) IV, polyethylene glycol  Time spent: 20 minutes

## 2019-12-20 LAB — GLUCOSE, CAPILLARY
Glucose-Capillary: 320 mg/dL — ABNORMAL HIGH (ref 70–99)
Glucose-Capillary: 244 mg/dL — ABNORMAL HIGH (ref 70–99)

## 2019-12-20 NOTE — Progress Notes (Signed)
[facility] Progress Note  Patient: Andre Rowe    JHE:[id number]  DOA: 11/27/2019     Date of Service: the patient was seen and examined on 12/20/2019  Chief Complaint  Patient presents with  . Fall   Brief hospital course: As per the H and P dictated on admission, "Bjorn Pritchettis a 79 y.o.malewith a history of schizophrenia, dementia, type 2 diabetes, hypertension, hyperlipidemia. Patient currently lives at [facility]. As the patient has dementia and schizophrenia, the patient is unable to give history. History is obtained from caregiver. Patient has been having a gradual decline over the past week, but a more sudden decline over the past 12 hours. Since today, the patient has needed a fair amount assistance in walking as he is unable to balance. This is a new change. The patient had 2 falls earlier today: The first in the shower, then on the porch while smoking. Patient hit his head and left shoulder on the second fall. He was brought to the hospital for evaluation.   Patient admitted from group home, they were not able to meet his care demands. Currently further plan is patient awaiting state representative for discharge to a skilled nursing facility.  Assessment and Plan: Ataxia. Ambulatory dysfunction. Recurrent fall. Patient is from a group home was brought to the hospital due to acute decline on top of patient's progressive decline. Patient had 2 falls during the day on day of admission. MRI brain was performed which was negative for any acute stroke or abnormality. PT OT was consulted. Patient after initial refusal is agreeable to work with therapy currently requiring SNF level of care to improve mobility enough that he can go back to group home.  Schizophrenia. On Tegretol, Klonopin and Zyprexa at home. Continue home regimen for now.  Essential hypertension. Blood pressure stable. Continue current regimen.  History of subdural  hematoma. CT scan unremarkable.  Unspecified dementia with behavioral disturbance continue supportive care  Type 2 diabetes mellitus without complication. Controlled. Hemoglobin A1c 7.6. Hypoglycemic events improved with decreasing dose of Levemir.  Body mass index is 33.41 kg/m.  Nutrition Problem: Inadequate oral intake Etiology: poor appetite Interventions: Interventions: Ensure Enlive (each supplement provides 350kcal and 20 grams of protein), MVI  Diet: Carb modified diet DVT Prophylaxis: Subcutaneous Heparin    Advance goals of care discussion: Full code  Family Communication: no family.  Social worker communicating with the state representative.  Disposition:  Status is: Inpatient  Remains inpatient appropriate because:Unsafe d/c plan   Dispo: The patient is from: Group home              Anticipated d/c is to: SNF              Anticipated d/c date is: when bed available.              Patient currently is medically stable to d/c.  Subjective: Seen and examined.  No overnight events.  Eating breakfast with some help but mostly eating independently.  Pleasantly confused.    Physical Exam: Physical Exam  Constitutional: He is well-developed, well-nourished, and in no distress.  HENT:  Head: Atraumatic.  Cardiovascular: Normal rate.  Psychiatric:  Comfortable.  Alert but not oriented.  Pleasant without any behavior disturbances.    Vitals:   12/19/19 1929 12/19/19 2340 12/20/19 0349 12/20/19 0819  BP: 113/83 127/89 134/75 104/64  Pulse: 92 81 78 (!) 103  Resp: 20 20 20 20   Temp: 98.1 F (36.7 C) 98 F (36.7  C) 98.2 F (36.8 C) 98 F (36.7 C)  TempSrc: Oral Oral Axillary Axillary  SpO2: 100% 96% 94% 95%  Weight:      Height:        Intake/Output Summary (Last 24 hours) at 12/20/2019 1033 Last data filed at 12/20/2019 0900 Gross per 24 hour  Intake 120 ml  Output 500 ml  Net -380 ml   Filed Weights   11/27/19 1443  Weight: 93.9 kg    Data  Reviewed: I have personally reviewed and interpreted daily labs, tele strips, imagings as discussed above. I reviewed all nursing notes, pharmacy notes, vitals, pertinent old records I have discussed plan of care as described above with RN and patient/family.  CBC: No results for input(s): WBC, NEUTROABS, HGB, HCT, MCV, PLT in the last 168 hours. Basic Metabolic Panel: No results for input(s): NA, K, CL, CO2, GLUCOSE, BUN, CREATININE, CALCIUM, MG, PHOS in the last 168 hours.  Studies: No results found.  Scheduled Meds: . carbamazepine  200 mg Oral TID  . clonazePAM  0.5 mg Oral TID  . donepezil  10 mg Oral QHS  . enoxaparin (LOVENOX) injection  40 mg Subcutaneous Daily  . feeding supplement (ENSURE ENLIVE)  237 mL Oral TID BM  . insulin aspart  0-6 Units Subcutaneous TID WC  . insulin detemir  15 Units Subcutaneous q morning - 10a  . latanoprost  1 drop Both Eyes QHS  . memantine  10 mg Oral BID  . multivitamin with minerals  1 tablet Oral Daily  . OLANZapine  10 mg Oral QHS  . oxybutynin  5 mg Oral BID  . simvastatin  40 mg Oral Daily  . tamsulosin  0.4 mg Oral Daily  . thiamine  100 mg Oral Daily   Continuous Infusions: PRN Meds: acetaminophen, bisacodyl, ondansetron **OR** ondansetron (ZOFRAN) IV, polyethylene glycol  Time spent: 20 minutes

## 2019-12-20 NOTE — Progress Notes (Signed)
Physical Therapy Treatment Patient Details Name: Andre Rowe MRN: [id number] DOB: 05/15/1941 Today's Date: 12/20/2019    History of Present Illness Andre Rowe is a 78 y.o. male admitted 11/27/19 s/p x2 falls hitting head and shoulder with generalized weakness. CT C spine- multilvel degenerative changes, no fx. CT head: negative. CT shoulder negative. MRI brain no acute changes.  PMHx: schizophrenia, dementia, T2DM, HTN, HLD. Patient currently lives at [facility]    PT Comments    Patient initially very cooperative and cheerful. Demonstrated sudden changes in mood x 3 (to agitated, back to cooperative/calm and back to agitated). Ultimately able to persuade patient to get OOB to sit up in recliner. Patient would not ambulate any further distance. Patient content once settled in chair with chocolate Ensure to drink.     Follow Up Recommendations  SNF;Supervision/Assistance - 24 hour     Equipment Recommendations  Other (comment)(TBD next venue)    Recommendations for Other Services       Precautions / Restrictions Precautions Precautions: Fall Precaution Comments: Pt has had periods of combativeness Restrictions Weight Bearing Restrictions: No    Mobility  Bed Mobility Overal bed mobility: Needs Assistance Bed Mobility: Sidelying to Sit     Supine to sit: HOB elevated;Mod assist;+2 for physical assistance     General bed mobility comments: assist to bring bilat LE to EOB and to elevate trunk into sitting  Transfers Overall transfer level: Needs assistance Equipment used: 2 person hand held assist Transfers: Sit to/from Stand;Stand Pivot Transfers Sit to Stand: +2 physical assistance;+2 safety/equipment;Min assist Stand pivot transfers: +2 physical assistance;+2 safety/equipment;Min assist       General transfer comment: pt becoming agitated and decided not to use the RW; pt did well with HHA  Ambulation/Gait             General Gait Details:  pt refused/became agitated with bed to chair   Stairs             Wheelchair Mobility    Modified Rankin (Stroke Patients Only)       Balance Overall balance assessment: History of Falls;Needs assistance Sitting-balance support: Feet supported Sitting balance-Leahy Scale: Fair Sitting balance - Comments: close supervision for safety due to decr cognition Postural control: Left lateral lean Standing balance support: Bilateral upper extremity supported Standing balance-Leahy Scale: Poor Standing balance comment: left lean in standing                            Cognition Arousal/Alertness: Awake/alert Behavior During Therapy: Agitated Overall Cognitive Status: No family/caregiver present to determine baseline cognitive functioning                                 General Comments: Patient initially very cheerful and cooperative with session. Sudden change in mood to yelling at therapist to "leave his things alone and get out!" Able to leave pt's sight while technician  talked to pt and he immediately settled down and agreed to OOB.       Exercises      General Comments General comments (skin integrity, edema, etc.): Session limited due to pt again becoming angry once seated in recliner. Agreeable to stay up in chair and drink his chocolate Ensure      Pertinent Vitals/Pain Pain Assessment: Faces Faces Pain Scale: No hurt    Home Living                        Prior Function            PT Goals (current goals can now be found in the care plan section) Acute Rehab PT Goals Patient Stated Goal: pt unable PT Goal Formulation: Patient unable to participate in goal setting Time For Goal Achievement: 12/27/19 Potential to Achieve Goals: Fair Progress towards PT goals: (timeframe met; goals updated)    Frequency    Min 2X/week      PT Plan Current plan remains appropriate    Co-evaluation              AM-PAC PT "6  Clicks" Mobility   Outcome Measure  Help needed turning from your back to your side while in a flat bed without using bedrails?: A Lot Help needed moving from lying on your back to sitting on the side of a flat bed without using bedrails?: A Lot Help needed moving to and from a bed to a chair (including a wheelchair)?: A Little Help needed standing up from a chair using your arms (e.g., wheelchair or bedside chair)?: A Little Help needed to walk in hospital room?: Total Help needed climbing 3-5 steps with a railing? : Total 6 Click Score: 12    End of Session Equipment Utilized During Treatment: Gait belt Activity Tolerance: Patient tolerated treatment well Patient left: in chair;with chair alarm set;with call bell/phone within reach Nurse Communication: Mobility status PT Visit Diagnosis: Unsteadiness on feet (R26.81);Other symptoms and signs involving the nervous system (R29.898);Difficulty in walking, not elsewhere classified (R26.2);Muscle weakness (generalized) (M62.81)     Time: 4259-5638 PT Time Calculation (min) (ACUTE ONLY): 16 min  Charges:  $Gait Training: 8-22 mins                      Arby Barrette, PT Pager [phone]    Rexanne Mano 12/20/2019, 6:21 PM

## 2019-12-20 NOTE — Progress Notes (Signed)
Patient refused CPAP.

## 2019-12-21 DIAGNOSIS — E785 Hyperlipidemia, unspecified: Secondary | ICD-10-CM | POA: Diagnosis not present

## 2019-12-21 DIAGNOSIS — M2550 Pain in unspecified joint: Secondary | ICD-10-CM | POA: Diagnosis not present

## 2019-12-21 DIAGNOSIS — S065X9A Traumatic subdural hemorrhage with loss of consciousness of unspecified duration, initial encounter: Secondary | ICD-10-CM | POA: Diagnosis not present

## 2019-12-21 DIAGNOSIS — R569 Unspecified convulsions: Secondary | ICD-10-CM | POA: Diagnosis not present

## 2019-12-21 DIAGNOSIS — H409 Unspecified glaucoma: Secondary | ICD-10-CM | POA: Diagnosis not present

## 2019-12-21 DIAGNOSIS — N400 Enlarged prostate without lower urinary tract symptoms: Secondary | ICD-10-CM | POA: Diagnosis not present

## 2019-12-21 DIAGNOSIS — F209 Schizophrenia, unspecified: Secondary | ICD-10-CM | POA: Diagnosis not present

## 2019-12-21 DIAGNOSIS — F419 Anxiety disorder, unspecified: Secondary | ICD-10-CM | POA: Diagnosis not present

## 2019-12-21 DIAGNOSIS — E119 Type 2 diabetes mellitus without complications: Secondary | ICD-10-CM | POA: Diagnosis not present

## 2019-12-21 DIAGNOSIS — Z79899 Other long term (current) drug therapy: Secondary | ICD-10-CM | POA: Diagnosis not present

## 2019-12-21 DIAGNOSIS — E559 Vitamin D deficiency, unspecified: Secondary | ICD-10-CM | POA: Diagnosis not present

## 2019-12-21 DIAGNOSIS — R1310 Dysphagia, unspecified: Secondary | ICD-10-CM | POA: Diagnosis not present

## 2019-12-21 DIAGNOSIS — F0391 Unspecified dementia with behavioral disturbance: Secondary | ICD-10-CM | POA: Diagnosis not present

## 2019-12-21 DIAGNOSIS — R270 Ataxia, unspecified: Secondary | ICD-10-CM | POA: Diagnosis not present

## 2019-12-21 DIAGNOSIS — R456 Violent behavior: Secondary | ICD-10-CM | POA: Diagnosis not present

## 2019-12-21 DIAGNOSIS — R5381 Other malaise: Secondary | ICD-10-CM | POA: Diagnosis not present

## 2019-12-21 DIAGNOSIS — Z7401 Bed confinement status: Secondary | ICD-10-CM | POA: Diagnosis not present

## 2019-12-21 DIAGNOSIS — R0902 Hypoxemia: Secondary | ICD-10-CM | POA: Diagnosis not present

## 2019-12-21 DIAGNOSIS — I10 Essential (primary) hypertension: Secondary | ICD-10-CM | POA: Diagnosis not present

## 2019-12-21 DIAGNOSIS — K219 Gastro-esophageal reflux disease without esophagitis: Secondary | ICD-10-CM | POA: Diagnosis not present

## 2019-12-21 DIAGNOSIS — D518 Other vitamin B12 deficiency anemias: Secondary | ICD-10-CM | POA: Diagnosis not present

## 2019-12-21 DIAGNOSIS — E1169 Type 2 diabetes mellitus with other specified complication: Secondary | ICD-10-CM | POA: Diagnosis not present

## 2019-12-21 DIAGNOSIS — R4182 Altered mental status, unspecified: Secondary | ICD-10-CM | POA: Diagnosis not present

## 2019-12-21 DIAGNOSIS — G9340 Encephalopathy, unspecified: Secondary | ICD-10-CM | POA: Diagnosis not present

## 2019-12-21 DIAGNOSIS — E7849 Other hyperlipidemia: Secondary | ICD-10-CM | POA: Diagnosis not present

## 2019-12-21 DIAGNOSIS — B360 Pityriasis versicolor: Secondary | ICD-10-CM | POA: Diagnosis not present

## 2019-12-21 LAB — RESPIRATORY PANEL BY RT PCR (FLU A&B, COVID)
Influenza A by PCR: NEGATIVE
Influenza B by PCR: NEGATIVE
SARS Coronavirus 2 by RT PCR: NEGATIVE

## 2019-12-21 LAB — GLUCOSE, CAPILLARY: Glucose-Capillary: 164 mg/dL — ABNORMAL HIGH (ref 70–99)

## 2019-12-21 NOTE — Discharge Summary (Signed)
Physician Discharge Summary  Andre Rowe VQM:[id number] DOB: Nov 24, 1940 DOA: 11/27/2019  PCP: Maryruth Hancock, MD  Admit date: 11/27/2019 Discharge date: 12/21/2019  Admitted From: Group home Disposition: Skilled nursing facility  Recommendations for Outpatient Follow-up:  1. Follow up with PCP in 1-2 weeks 2. Please obtain BMP/CBC in one week   Discharge Condition: Stable CODE STATUS: Full code Diet recommendation: Low-carb low-salt diet  Discharge summary: 79 year old gentleman with history of schizophrenia, dementia, type 2 diabetes, hypertension and hyperlipidemia from a group home who was brought to the emergency room with gradual decline over past week, deconditioning, loss of balance and fall.  He was admitted to the hospital.  Treated for following conditions and transfer to skilled nursing facility today.  # Ataxia. Ambulatory dysfunction. Recurrent fall. Patient is from a group home was brought to the hospital due to acute decline on top of patient's progressive decline. Patient had 2 falls during the day on day of admission. MRI brain was performed which was negative for any acute stroke or abnormality. PT OT was consulted. Deconditioning, needs to work with PT/OT before returning to group home level of care.  # Schizophrenia. On Tegretol, Klonopin and Zyprexa at home. Continue home regimen for now.  # Essential hypertension. Blood pressure stable. Continue current regimen.  # History of subdural hematoma. CT scan unremarkable.  # Unspecified dementia with behavioral disturbance continue supportive care  # Type 2 diabetes mellitus without complication. Controlled. Hemoglobin A1c 7.6. Occasionally develops hypoglycemia because of inconsistent eating.  Continue current regimen with close follow-up of blood sugars.  Suggested to keep blood sugars high normal.  Patient is clinically stable to transfer to skilled level of care.  Discharge Diagnoses:   Principal Problem:   Ataxia Active Problems:   Controlled type 2 diabetes mellitus with complication, with long-term current use of insulin (HCC)   Schizophrenia (HCC)   Essential hypertension   History of subdural hematoma   Unspecified dementia with behavioral disturbance (HCC)   Hyperlipidemia associated with type 2 diabetes mellitus (HCC)   Altered mental status    Discharge Instructions  Discharge Instructions    Diet - low sodium heart healthy   Complete by: As directed    Diet Carb Modified   Complete by: As directed    Increase activity slowly   Complete by: As directed    Increase activity slowly   Complete by: As directed      Allergies as of 12/21/2019   No Known Allergies     Medication List    STOP taking these medications   bimatoprost 0.01 % Soln Commonly known as: LUMIGAN     TAKE these medications   acetaminophen 325 MG tablet Commonly known as: TYLENOL Take 650 mg by mouth every 6 (six) hours as needed for mild pain or moderate pain.   carbamazepine 200 MG tablet Commonly known as: TEGRETOL Take 200 mg by mouth 3 (three) times daily.   clonazePAM 0.5 MG tablet Commonly known as: KLONOPIN Take 1 tablet (0.5 mg total) by mouth 2 (two) times daily.   donepezil 10 MG tablet Commonly known as: ARICEPT Take 10 mg by mouth at bedtime.   folic acid 1 MG tablet Commonly known as: FOLVITE Take 1 mg by mouth daily.   insulin detemir 100 UNIT/ML injection Commonly known as: Levemir Inject 0.16 mLs (16 Units total) into the skin every morning. What changed: how much to take   ketoconazole 2 % cream Commonly known as: NIZORAL Apply 1 application  topically 2 (two) times daily as needed for irritation.   latanoprost 0.005 % ophthalmic solution Commonly known as: XALATAN Place 1 drop into both eyes at bedtime.   memantine 10 MG tablet Commonly known as: NAMENDA Take 10 mg by mouth 2 (two) times daily.   metFORMIN 1000 MG tablet Commonly  known as: GLUCOPHAGE TAKE 1 TABLET BY MOUTH TWICE DAILY. What changed: when to take this   OLANZapine 10 MG tablet Commonly known as: ZYPREXA Take 10 mg by mouth at bedtime.   omeprazole 20 MG capsule Commonly known as: PRILOSEC Take 20 mg by mouth daily.   oxybutynin 5 MG tablet Commonly known as: DITROPAN Take 5 mg by mouth 2 (two) times daily.   polyethylene glycol powder 17 GM/SCOOP powder Commonly known as: GLYCOLAX/MIRALAX Take 17 g by mouth daily. Mixed with 8 ounces of water or juice and drink once daily   senna-docusate 8.6-50 MG tablet Commonly known as: Senokot-S Take 1 tablet by mouth at bedtime.   simvastatin 40 MG tablet Commonly known as: ZOCOR Take 40 mg by mouth daily.   tamsulosin 0.4 MG Caps capsule Commonly known as: FLOMAX Take 0.4 mg by mouth daily.   thiamine 100 MG tablet Take 1 tablet (100 mg total) by mouth daily.   triamcinolone cream 0.5 % Commonly known as: KENALOG Apply 1 application topically 2 (two) times daily as needed (applied between toes).   Vitamin D (Ergocalciferol) 1.25 MG (50000 UNIT) Caps capsule Commonly known as: DRISDOL TAKE 1 TABLET BY MOUTH ONCE WEEKLY. What changed: when to take this      Follow-up Information    Corum, Minerva Fester, MD. Schedule an appointment as soon as possible for a visit in 1 week(s).   Specialty: Family Medicine Contact information: [address] [place] [address] [phone]          No Known Allergies   Procedures/Studies: DG Chest 2 View  Result Date: 11/27/2019 CLINICAL DATA:  Fall, dementia EXAM: CHEST - 2 VIEW COMPARISON:  04/22/2017 FINDINGS: The heart size and mediastinal contours are within normal limits. Low lung volumes with streaky bibasilar opacities. No pleural effusion or pneumothorax. No acute osseous finding. IMPRESSION: Low lung volumes with streaky bibasilar opacities, likely atelectasis. Electronically Signed   By: Duanne Guess D.O.   On: 11/27/2019 15:45    DG Abd 1 View  Result Date: 11/27/2019 CLINICAL DATA:  79 year old male with abdominal pain. EXAM: ABDOMEN - 1 VIEW COMPARISON:  CT abdomen pelvis dated 12/25/2013. FINDINGS: Evaluation is limited due to body habitus and soft tissue attenuation. There is eventration of the left hemidiaphragm. There is moderate stool throughout the colon. There is no bowel dilatation or evidence of obstruction. No free air or obvious radiopaque calculi. The osseous structures and soft tissues are grossly unremarkable. IMPRESSION: Constipation. No bowel obstruction. Electronically Signed   By: Elgie Collard M.D.   On: 11/27/2019 23:59   CT Head Wo Contrast  Result Date: 11/27/2019 CLINICAL DATA:  Head trauma EXAM: CT HEAD WITHOUT CONTRAST CT CERVICAL SPINE WITHOUT CONTRAST TECHNIQUE: Multidetector CT imaging of the head and cervical spine was performed following the standard protocol without intravenous contrast. Multiplanar CT image reconstructions of the cervical spine were also generated. COMPARISON:  None. 06/09/2017 FINDINGS: CT HEAD FINDINGS Brain: No evidence of acute infarction, hemorrhage, hydrocephalus, extra-axial collection or mass lesion/mass effect. Signs of atrophy and chronic microvascular ischemic change are similar to the prior study. Vascular: No hyperdense vessel or unexpected calcification. Skull: Normal. Negative  for fracture or focal lesion. Sinuses/Orbits: Visualized paranasal sinuses and orbits are unremarkable. Other: Lenticular areas of low density about the left orbit, 1 likely within the left eyelid measuring 18 x 11 mm. This was present on the prior exam measuring approximately 14 by 8 mm. This is well-circumscribed and likely amenable to direct clinical inspection. CT CERVICAL SPINE FINDINGS Alignment: Reversal of normal cervical lordosis in the upper cervical spine in the setting of degenerative change. Skull base and vertebrae: No acute fracture. No primary bone lesion or focal pathologic  process. Soft tissues and spinal canal: No prevertebral fluid or swelling. No visible canal hematoma. Disc levels: Multilevel spinal degenerative change is similar to the prior study most pronounced at C3-4, C4-5 and C5-6. Also at C6-7. Uncovertebral spurring is noted at multiple levels along with facet hypertrophy. Facet hypertrophy is greatest on the right at C2-3. Upper chest: Emphysematous changes at the lung apices. Other: None IMPRESSION: 1. No acute intracranial abnormality. 2. Signs of atrophy and chronic microvascular ischemic change. 3. Lenticular areas of low density about the left orbit, 1 likely within the left eyelid measuring 18 x 11 mm. This was present on the prior exam measuring approximately 14 by 8 mm. This is well-circumscribed and likely amenable to direct clinical inspection. These may represent sebaceous cysts. 4. Multilevel spinal degenerative changes without acute fracture. Electronically Signed   By: Donzetta Kohut M.D.   On: 11/27/2019 16:33   CT Cervical Spine Wo Contrast  Result Date: 11/27/2019 CLINICAL DATA:  Head trauma EXAM: CT HEAD WITHOUT CONTRAST CT CERVICAL SPINE WITHOUT CONTRAST TECHNIQUE: Multidetector CT imaging of the head and cervical spine was performed following the standard protocol without intravenous contrast. Multiplanar CT image reconstructions of the cervical spine were also generated. COMPARISON:  None. 06/09/2017 FINDINGS: CT HEAD FINDINGS Brain: No evidence of acute infarction, hemorrhage, hydrocephalus, extra-axial collection or mass lesion/mass effect. Signs of atrophy and chronic microvascular ischemic change are similar to the prior study. Vascular: No hyperdense vessel or unexpected calcification. Skull: Normal. Negative for fracture or focal lesion. Sinuses/Orbits: Visualized paranasal sinuses and orbits are unremarkable. Other: Lenticular areas of low density about the left orbit, 1 likely within the left eyelid measuring 18 x 11 mm. This was present  on the prior exam measuring approximately 14 by 8 mm. This is well-circumscribed and likely amenable to direct clinical inspection. CT CERVICAL SPINE FINDINGS Alignment: Reversal of normal cervical lordosis in the upper cervical spine in the setting of degenerative change. Skull base and vertebrae: No acute fracture. No primary bone lesion or focal pathologic process. Soft tissues and spinal canal: No prevertebral fluid or swelling. No visible canal hematoma. Disc levels: Multilevel spinal degenerative change is similar to the prior study most pronounced at C3-4, C4-5 and C5-6. Also at C6-7. Uncovertebral spurring is noted at multiple levels along with facet hypertrophy. Facet hypertrophy is greatest on the right at C2-3. Upper chest: Emphysematous changes at the lung apices. Other: None IMPRESSION: 1. No acute intracranial abnormality. 2. Signs of atrophy and chronic microvascular ischemic change. 3. Lenticular areas of low density about the left orbit, 1 likely within the left eyelid measuring 18 x 11 mm. This was present on the prior exam measuring approximately 14 by 8 mm. This is well-circumscribed and likely amenable to direct clinical inspection. These may represent sebaceous cysts. 4. Multilevel spinal degenerative changes without acute fracture. Electronically Signed   By: Donzetta Kohut M.D.   On: 11/27/2019 16:33   MR BRAIN WO CONTRAST  Result Date: 11/28/2019 CLINICAL DATA:  Initial evaluation for acute ataxia, stroke suspected. EXAM: MRI HEAD WITHOUT CONTRAST TECHNIQUE: Multiplanar, multiecho pulse sequences of the brain and surrounding structures were obtained without intravenous contrast. COMPARISON:  Prior head CT from 11/27/2019. FINDINGS: Brain: Examination moderately degraded by motion artifact. Generalized age-related cerebral atrophy. Patchy T2/FLAIR hyperintensity within the periventricular white matter and pons, most consistent with chronic small vessel ischemic disease, mild in nature. No  abnormal foci of restricted diffusion to suggest acute or subacute ischemia. Gray-white matter differentiation maintained. No encephalomalacia to suggest chronic cortical infarction. No evidence for acute or chronic intracranial hemorrhage. No mass lesion, midline shift or mass effect. Diffuse ventricular prominence related to global parenchymal volume loss of hydrocephalus. No extra-axial fluid collection. Pituitary gland grossly normal. Midline structures intact. Vascular: Major intracranial vascular flow voids are maintained. Skull and upper cervical spine: Craniocervical junction within normal limits. Bone marrow signal intensity normal. No scalp soft tissue abnormality. Sinuses/Orbits: Globes orbital soft tissues demonstrate no acute finding. Remote posttraumatic defect noted at the right lamina papyracea. Mild chronic mucoperiosteal thickening noted throughout the paranasal sinuses. Small right mastoid effusion, of doubtful significance. Other: Few probable sebaceous cysts noted involving the left periorbital soft tissues, largest of which measures 18 mm. 9 mm cystic lesion noted at the central aspect of the alveolar ridge, nonspecific, but of doubtful clinical significance (series 13, image 1). IMPRESSION: 1. No acute intracranial abnormality. 2. Generalized age-related cerebral atrophy with mild chronic small vessel ischemic disease. Electronically Signed   By: Rise MuBenjamin  McClintock M.D.   On: 11/28/2019 02:59   DG Shoulder Left  Result Date: 11/27/2019 CLINICAL DATA:  Left shoulder pain after fall EXAM: LEFT SHOULDER - 2+ VIEW COMPARISON:  Chest x-ray 04/22/2017 FINDINGS: There is no evidence of fracture or dislocation. Mild degenerative changes. Small mineralized density inferior to the glenoid is unchanged from prior x-ray 04/22/2017, and may reflect a small intra-articular loose body. Soft tissues are unremarkable. Streaky left basilar opacity. IMPRESSION: 1. No acute osseous abnormality. 2. Mild  degenerative changes of the left shoulder. 3. Streaky left basilar opacity. Electronically Signed   By: Duanne GuessNicholas  Plundo D.O.   On: 11/27/2019 15:47     Subjective: Patient seen and examined.  No overnight events.  Pleasantly confused.  Behavior is well controlled.   Discharge Exam: Vitals:   12/21/19 0419 12/21/19 0736  BP: 122/69 (!) 140/91  Pulse: 71 65  Resp: 18 18  Temp: 97.6 F (36.4 C) 98.1 F (36.7 C)  SpO2: 96% 100%   Vitals:   12/20/19 2034 12/20/19 2345 12/21/19 0419 12/21/19 0736  BP: 118/72 98/62 122/69 (!) 140/91  Pulse: 77 84 71 65  Resp: 18 18 18 18   Temp: 98.6 F (37 C) 98.1 F (36.7 C) 97.6 F (36.4 C) 98.1 F (36.7 C)  TempSrc: Oral   Oral  SpO2: 100%  96% 100%  Weight:      Height:        General: Pt is alert, awake, not in acute distress, chronically sick looking.  Pleasantly confused.  Follows simple commands. Cardiovascular: RRR, S1/S2 +, no rubs, no gallops Respiratory: CTA bilaterally, no wheezing, no rhonchi Abdominal: Soft, NT, ND, bowel sounds + Extremities: no edema, no cyanosis    The results of significant diagnostics from this hospitalization (including imaging, microbiology, ancillary and laboratory) are listed below for reference.     Microbiology: Recent Results (from the past 240 hour(s))  Respiratory Panel by RT PCR (Flu A&B, Covid) -  Nasopharyngeal Swab     Status: None   Collection Time: 12/21/19  8:47 AM   Specimen: Nasopharyngeal Swab  Result Value Ref Range Status   SARS Coronavirus 2 by RT PCR NEGATIVE NEGATIVE Final    Comment: (NOTE) SARS-CoV-2 target nucleic acids are NOT DETECTED. The SARS-CoV-2 RNA is generally detectable in upper respiratoy specimens during the acute phase of infection. The lowest concentration of SARS-CoV-2 viral copies this assay can detect is 131 copies/mL. A negative result does not preclude SARS-Cov-2 infection and should not be used as the sole basis for treatment or other patient  management decisions. A negative result may occur with  improper specimen collection/handling, submission of specimen other than nasopharyngeal swab, presence of viral mutation(s) within the areas targeted by this assay, and inadequate number of viral copies (<131 copies/mL). A negative result must be combined with clinical observations, patient history, and epidemiological information. The expected result is Negative. Fact Sheet for Patients:  https://www.moore.com/ Fact Sheet for Healthcare Providers:  https://www.young.biz/ This test is not yet ap proved or cleared by the [place] FDA and  has been authorized for detection and/or diagnosis of SARS-CoV-2 by FDA under an Emergency Use Authorization (EUA). This EUA will remain  in effect (meaning this test can be used) for the duration of the COVID-19 declaration under Section 564(b)(1) of the Act, 21 U.S.C. section 360bbb-3(b)(1), unless the authorization is terminated or revoked sooner.    Influenza A by PCR NEGATIVE NEGATIVE Final   Influenza B by PCR NEGATIVE NEGATIVE Final    Comment: (NOTE) The Xpert Xpress SARS-CoV-2/FLU/RSV assay is intended as an aid in  the diagnosis of influenza from Nasopharyngeal swab specimens and  should not be used as a sole basis for treatment. Nasal washings and  aspirates are unacceptable for Xpert Xpress SARS-CoV-2/FLU/RSV  testing. Fact Sheet for Patients: https://www.moore.com/ Fact Sheet for Healthcare Providers: https://www.young.biz/ This test is not yet approved or cleared by the [place] FDA and  has been authorized for detection and/or diagnosis of SARS-CoV-2 by  FDA under an Emergency Use Authorization (EUA). This EUA will remain  in effect (meaning this test can be used) for the duration of the  Covid-19 declaration under Section 564(b)(1) of the Act, 21  U.S.C. section 360bbb-3(b)(1), unless the  authorization is  terminated or revoked. Performed at [facility], [address], [place], [place] [address]      Labs: BNP (last 3 results) No results for input(s): BNP in the last 8760 hours. Basic Metabolic Panel: No results for input(s): NA, K, CL, CO2, GLUCOSE, BUN, CREATININE, CALCIUM, MG, PHOS in the last 168 hours. Liver Function Tests: No results for input(s): AST, ALT, ALKPHOS, BILITOT, PROT, ALBUMIN in the last 168 hours. No results for input(s): LIPASE, AMYLASE in the last 168 hours. No results for input(s): AMMONIA in the last 168 hours. CBC: No results for input(s): WBC, NEUTROABS, HGB, HCT, MCV, PLT in the last 168 hours. Cardiac Enzymes: No results for input(s): CKTOTAL, CKMB, CKMBINDEX, TROPONINI in the last 168 hours. BNP: Invalid input(s): POCBNP CBG: Recent Labs  Lab 12/19/19 1154 12/19/19 1753 12/20/19 1154 12/20/19 1602 12/20/19 2112  GLUCAP 277* 118* 320* 244* 164*   D-Dimer No results for input(s): DDIMER in the last 72 hours. Hgb A1c No results for input(s): HGBA1C in the last 72 hours. Lipid Profile No results for input(s): CHOL, HDL, LDLCALC, TRIG, CHOLHDL, LDLDIRECT in the last 72 hours. Thyroid function studies No results for input(s): TSH, T4TOTAL,  T3FREE, THYROIDAB in the last 72 hours.  Invalid input(s): FREET3 Anemia work up No results for input(s): VITAMINB12, FOLATE, FERRITIN, TIBC, IRON, RETICCTPCT in the last 72 hours. Urinalysis    Component Value Date/Time   COLORURINE YELLOW 11/27/2019 1517   APPEARANCEUR CLEAR 11/27/2019 1517   LABSPEC 1.021 11/27/2019 1517   PHURINE 5.0 11/27/2019 1517   GLUCOSEU 150 (A) 11/27/2019 1517   HGBUR SMALL (A) 11/27/2019 1517   BILIRUBINUR NEGATIVE 11/27/2019 1517   KETONESUR NEGATIVE 11/27/2019 1517   PROTEINUR NEGATIVE 11/27/2019 1517   UROBILINOGEN 0.2 12/08/2014 1210   NITRITE NEGATIVE 11/27/2019 1517   LEUKOCYTESUR NEGATIVE 11/27/2019 1517   Sepsis Labs Invalid input(s):  PROCALCITONIN,  WBC,  LACTICIDVEN Microbiology Recent Results (from the past 240 hour(s))  Respiratory Panel by RT PCR (Flu A&B, Covid) - Nasopharyngeal Swab     Status: None   Collection Time: 12/21/19  8:47 AM   Specimen: Nasopharyngeal Swab  Result Value Ref Range Status   SARS Coronavirus 2 by RT PCR NEGATIVE NEGATIVE Final    Comment: (NOTE) SARS-CoV-2 target nucleic acids are NOT DETECTED. The SARS-CoV-2 RNA is generally detectable in upper respiratoy specimens during the acute phase of infection. The lowest concentration of SARS-CoV-2 viral copies this assay can detect is 131 copies/mL. A negative result does not preclude SARS-Cov-2 infection and should not be used as the sole basis for treatment or other patient management decisions. A negative result may occur with  improper specimen collection/handling, submission of specimen other than nasopharyngeal swab, presence of viral mutation(s) within the areas targeted by this assay, and inadequate number of viral copies (<131 copies/mL). A negative result must be combined with clinical observations, patient history, and epidemiological information. The expected result is Negative. Fact Sheet for Patients:  https://www.moore.com/ Fact Sheet for Healthcare Providers:  https://www.young.biz/ This test is not yet ap proved or cleared by the [place] FDA and  has been authorized for detection and/or diagnosis of SARS-CoV-2 by FDA under an Emergency Use Authorization (EUA). This EUA will remain  in effect (meaning this test can be used) for the duration of the COVID-19 declaration under Section 564(b)(1) of the Act, 21 U.S.C. section 360bbb-3(b)(1), unless the authorization is terminated or revoked sooner.    Influenza A by PCR NEGATIVE NEGATIVE Final   Influenza B by PCR NEGATIVE NEGATIVE Final    Comment: (NOTE) The Xpert Xpress SARS-CoV-2/FLU/RSV assay is intended as an aid in  the  diagnosis of influenza from Nasopharyngeal swab specimens and  should not be used as a sole basis for treatment. Nasal washings and  aspirates are unacceptable for Xpert Xpress SARS-CoV-2/FLU/RSV  testing. Fact Sheet for Patients: https://www.moore.com/ Fact Sheet for Healthcare Providers: https://www.young.biz/ This test is not yet approved or cleared by the [place] FDA and  has been authorized for detection and/or diagnosis of SARS-CoV-2 by  FDA under an Emergency Use Authorization (EUA). This EUA will remain  in effect (meaning this test can be used) for the duration of the  Covid-19 declaration under Section 564(b)(1) of the Act, 21  U.S.C. section 360bbb-3(b)(1), unless the authorization is  terminated or revoked. Performed at [facility], [address] [place], [place] [address]      Time coordinating discharge:  32 minutes  SIGNED:   Dorcas Carrow, MD  [facility] 12/21/2019, 9:49 AM

## 2019-12-21 NOTE — TOC Progression Note (Signed)
Transition of Care [facility]) - Progression Note    Patient Details  Name: Andre Rowe MRN: [id number] Date of Birth: [birth year]  Transition of Care [facility]) CM/SW Contact  Baldemar Len[place], [place] Phone Number: 12/21/2019, 11:01 AM  Clinical Narrative:   CSW notified by [facility] DSS that patient will have an interim guardian hearing today, so they will be able to fill paperwork to get patient to SNF. Patient needs to be in [facility] for SNF, will need to send referral to [facility] instead. CSW sent referral and asked them to review, waiting on response. CSW to follow.     Expected Discharge Plan: Skilled Nursing Facility Barriers to Discharge: Continued Medical Work up, Other (comment)  Expected Discharge Plan and Services Expected Discharge Plan: Skilled Nursing Facility     Post Acute Care Choice: Skilled Nursing Facility Living arrangements for the past 2 months: Group Home Expected Discharge Date: 12/21/19                                     Social Determinants of Health (SDOH) Interventions    Readmission Risk Interventions No flowsheet data found.

## 2019-12-21 NOTE — Plan of Care (Signed)

## 2019-12-21 NOTE — Progress Notes (Signed)
Occupational Therapy Treatment Patient Details Name: Andre Rowe MRN: [id number] DOB: 1941-04-23 Today's Date: 12/21/2019    History of present illness Andre Rowe is a 79 y.o. male admitted 11/27/19 s/p x2 falls hitting head and shoulder with generalized weakness. CT C spine- multilvel degenerative changes, no fx. CT head: negative. CT shoulder negative. MRI brain no acute changes.  PMHx: schizophrenia, dementia, T2DM, HTN, HLD. Patient currently lives at [facility] family care   OT comments  Patient continues to make steady progress towards goals in skilled OT session. Patient's session encompassed ADLs in order to increase overall independence. Pt continues to deviate between participation with therapy, and yelling for therapist to leave the room. Pt continues to require increased encouragement to participate, however completes minimal ADLs on his own without increased cues. Pt would continue to benefit from skilled services in order to address functional deficits; will continue to follow acutely.    Follow Up Recommendations  SNF    Equipment Recommendations  Wheelchair (measurements OT);Wheelchair cushion (measurements OT);Hospital bed    Recommendations for Other Services      Precautions / Restrictions Precautions Precautions: Fall Precaution Comments: Pt has had periods of combativeness Restrictions Weight Bearing Restrictions: No       Mobility Bed Mobility               General bed mobility comments: refused to sit EOB with therapist  Transfers                      Balance                                           ADL either performed or assessed with clinical judgement   ADL Overall ADL's : Needs assistance/impaired Eating/Feeding: Bed level;Minimal assistance;Moderate assistance Eating/Feeding Details (indicate cue type and reason): Min mod A in order to eat banana (manipulating peel and manuevering to bring to mouth) pt  refused to sit up to eat or sit EOB Grooming: Wash/dry face;Wash/dry hands;Set up Grooming Details (indicate cue type and reason): Dabbed wash cloth on face, refused to complete in more detail stating "I aint doing shit"                             Functional mobility during ADLs: +2 for physical assistance;Maximal assistance;Moderate assistance General ADL Comments: Pt refusing ADLs this date, going between spurts of lewd language and participation, could not coax to sit EOB or change gown or sheets despite being soiled to date as pt would continue to threaten therapist. Pt has been known to be combative, therefore therapist did not encourage pt any further.     Vision       Perception     Praxis      Cognition Arousal/Alertness: Awake/alert Behavior During Therapy: Agitated;Anxious Overall Cognitive Status: No family/caregiver present to determine baseline cognitive functioning                                 General Comments: Pt wanting to eat upon arrival and pleasant, pt continues to wax and wane between periods of cooperations and then telling therapist "Im gonna blow your head off" due to status, pt continues to make minimal gains in therapy  Exercises     Shoulder Instructions       General Comments      Pertinent Vitals/ Pain       Pain Assessment: No/denies pain Faces Pain Scale: No hurt  Home Living                                          Prior Functioning/Environment              Frequency  Min 2X/week        Progress Toward Goals  OT Goals(current goals can now be found in the care plan section)  Progress towards OT goals: Not progressing toward goals - comment(Pt continues to remain minimally participatory in therapy, therefore is not making functional gains)  Acute Rehab OT Goals Patient Stated Goal: pt unable OT Goal Formulation: Patient unable to participate in goal setting Time For Goal  Achievement: 12/29/19 Potential to Achieve Goals: [place] Discharge plan remains appropriate    Co-evaluation                 AM-PAC OT "6 Clicks" Daily Activity     Outcome Measure   Help from another person eating meals?: A Little Help from another person taking care of personal grooming?: A Lot Help from another person toileting, which includes using toliet, bedpan, or urinal?: Total Help from another person bathing (including washing, rinsing, drying)?: Total Help from another person to put on and taking off regular upper body clothing?: Total Help from another person to put on and taking off regular lower body clothing?: Total 6 Click Score: 9    End of Session    OT Visit Diagnosis: Unsteadiness on feet (R26.81);Muscle weakness (generalized) (M62.81)   Activity Tolerance Treatment limited secondary to agitation   Patient Left in bed;with call bell/phone within reach;with bed alarm set   Nurse Communication Mobility status;Precautions        Time: 4132-4401 OT Time Calculation (min): 13 min  Charges: OT General Charges $OT Visit: 1 Visit OT Treatments $Self Care/Home Management : 8-22 mins  Corinne Ports E. Khasir Woodrome, COTA/L Acute Rehabilitation Services [place] 12/21/2019, 10:23 AM

## 2019-12-21 NOTE — Progress Notes (Signed)
Report called to [facility], Pricilla Larsson LPN.

## 2019-12-21 NOTE — TOC Transition Note (Signed)
Transition of Care [facility]) - CM/SW Discharge Note   Patient Details  Name: Andre Rowe MRN: [id number] Date of Birth: [birth year]  [facility]) CM/SW Contact:  Terrilee Croak, Student-Social Work Phone Number: 12/21/2019, 10:35 AM   Clinical Narrative:    Nurse to call report to [phone], ask for [phone] hall. Rm # [phone]   Final next level of care: Skilled Nursing Facility Barriers to Discharge: No Barriers Identified   Patient Goals and CMS Choice Patient states their goals for this hospitalization and ongoing recovery are:: patient unable to participate in goal setting at this time CMS Medicare.gov Compare Post Acute Care list provided to:: Patient Represenative (must comment) Choice offered to / list presented to : (administrator of group home)  Discharge Placement              Patient chooses bed at: [facility] Patient to be transferred to facility by: [facility] Name of family member notified: Delmer Islam Patient and family notified of of transfer: 12/21/19  Discharge Plan and Services     Post Acute Care Choice: Skilled Nursing Facility                               Social Determinants of Health (SDOH) Interventions     Readmission Risk Interventions No flowsheet data found.

## 2019-12-22 DIAGNOSIS — F209 Schizophrenia, unspecified: Secondary | ICD-10-CM | POA: Diagnosis not present

## 2019-12-22 DIAGNOSIS — S065X9A Traumatic subdural hemorrhage with loss of consciousness of unspecified duration, initial encounter: Secondary | ICD-10-CM | POA: Diagnosis not present

## 2019-12-22 DIAGNOSIS — E119 Type 2 diabetes mellitus without complications: Secondary | ICD-10-CM | POA: Diagnosis not present

## 2019-12-22 DIAGNOSIS — R270 Ataxia, unspecified: Secondary | ICD-10-CM | POA: Diagnosis not present

## 2019-12-22 DIAGNOSIS — I10 Essential (primary) hypertension: Secondary | ICD-10-CM | POA: Diagnosis not present

## 2020-01-11 DIAGNOSIS — F0391 Unspecified dementia with behavioral disturbance: Secondary | ICD-10-CM | POA: Diagnosis not present

## 2020-01-11 DIAGNOSIS — H409 Unspecified glaucoma: Secondary | ICD-10-CM | POA: Diagnosis not present

## 2020-01-11 DIAGNOSIS — E1169 Type 2 diabetes mellitus with other specified complication: Secondary | ICD-10-CM | POA: Diagnosis not present

## 2020-01-11 DIAGNOSIS — R5381 Other malaise: Secondary | ICD-10-CM | POA: Diagnosis not present

## 2020-01-11 DIAGNOSIS — F209 Schizophrenia, unspecified: Secondary | ICD-10-CM | POA: Diagnosis not present

## 2020-01-11 DIAGNOSIS — R1310 Dysphagia, unspecified: Secondary | ICD-10-CM | POA: Diagnosis not present

## 2020-01-26 DIAGNOSIS — Z79899 Other long term (current) drug therapy: Secondary | ICD-10-CM | POA: Diagnosis not present

## 2020-01-27 DIAGNOSIS — R270 Ataxia, unspecified: Secondary | ICD-10-CM | POA: Diagnosis not present

## 2020-01-27 DIAGNOSIS — S065X9A Traumatic subdural hemorrhage with loss of consciousness of unspecified duration, initial encounter: Secondary | ICD-10-CM | POA: Diagnosis not present

## 2020-01-27 DIAGNOSIS — E1169 Type 2 diabetes mellitus with other specified complication: Secondary | ICD-10-CM | POA: Diagnosis not present

## 2020-01-27 DIAGNOSIS — F209 Schizophrenia, unspecified: Secondary | ICD-10-CM | POA: Diagnosis not present

## 2020-01-27 DIAGNOSIS — F0390 Unspecified dementia without behavioral disturbance: Secondary | ICD-10-CM | POA: Diagnosis not present

## 2020-02-01 DIAGNOSIS — E1342 Other specified diabetes mellitus with diabetic polyneuropathy: Secondary | ICD-10-CM | POA: Diagnosis not present

## 2020-02-01 DIAGNOSIS — B351 Tinea unguium: Secondary | ICD-10-CM | POA: Diagnosis not present

## 2020-02-01 DIAGNOSIS — L851 Acquired keratosis [keratoderma] palmaris et plantaris: Secondary | ICD-10-CM | POA: Diagnosis not present

## 2020-03-24 ENCOUNTER — Ambulatory Visit: Payer: Medicare Other | Admitting: "Endocrinology

## 2021-12-14 IMAGING — DX DG SHOULDER 2+V*L*
2 series · 2 of 2 positions shown · non-contrast
Comparison: Chest x-ray 04/22/2017

CLINICAL DATA: Left shoulder pain after fall

EXAM:
LEFT SHOULDER - 2+ VIEW

[shoulder grashey]
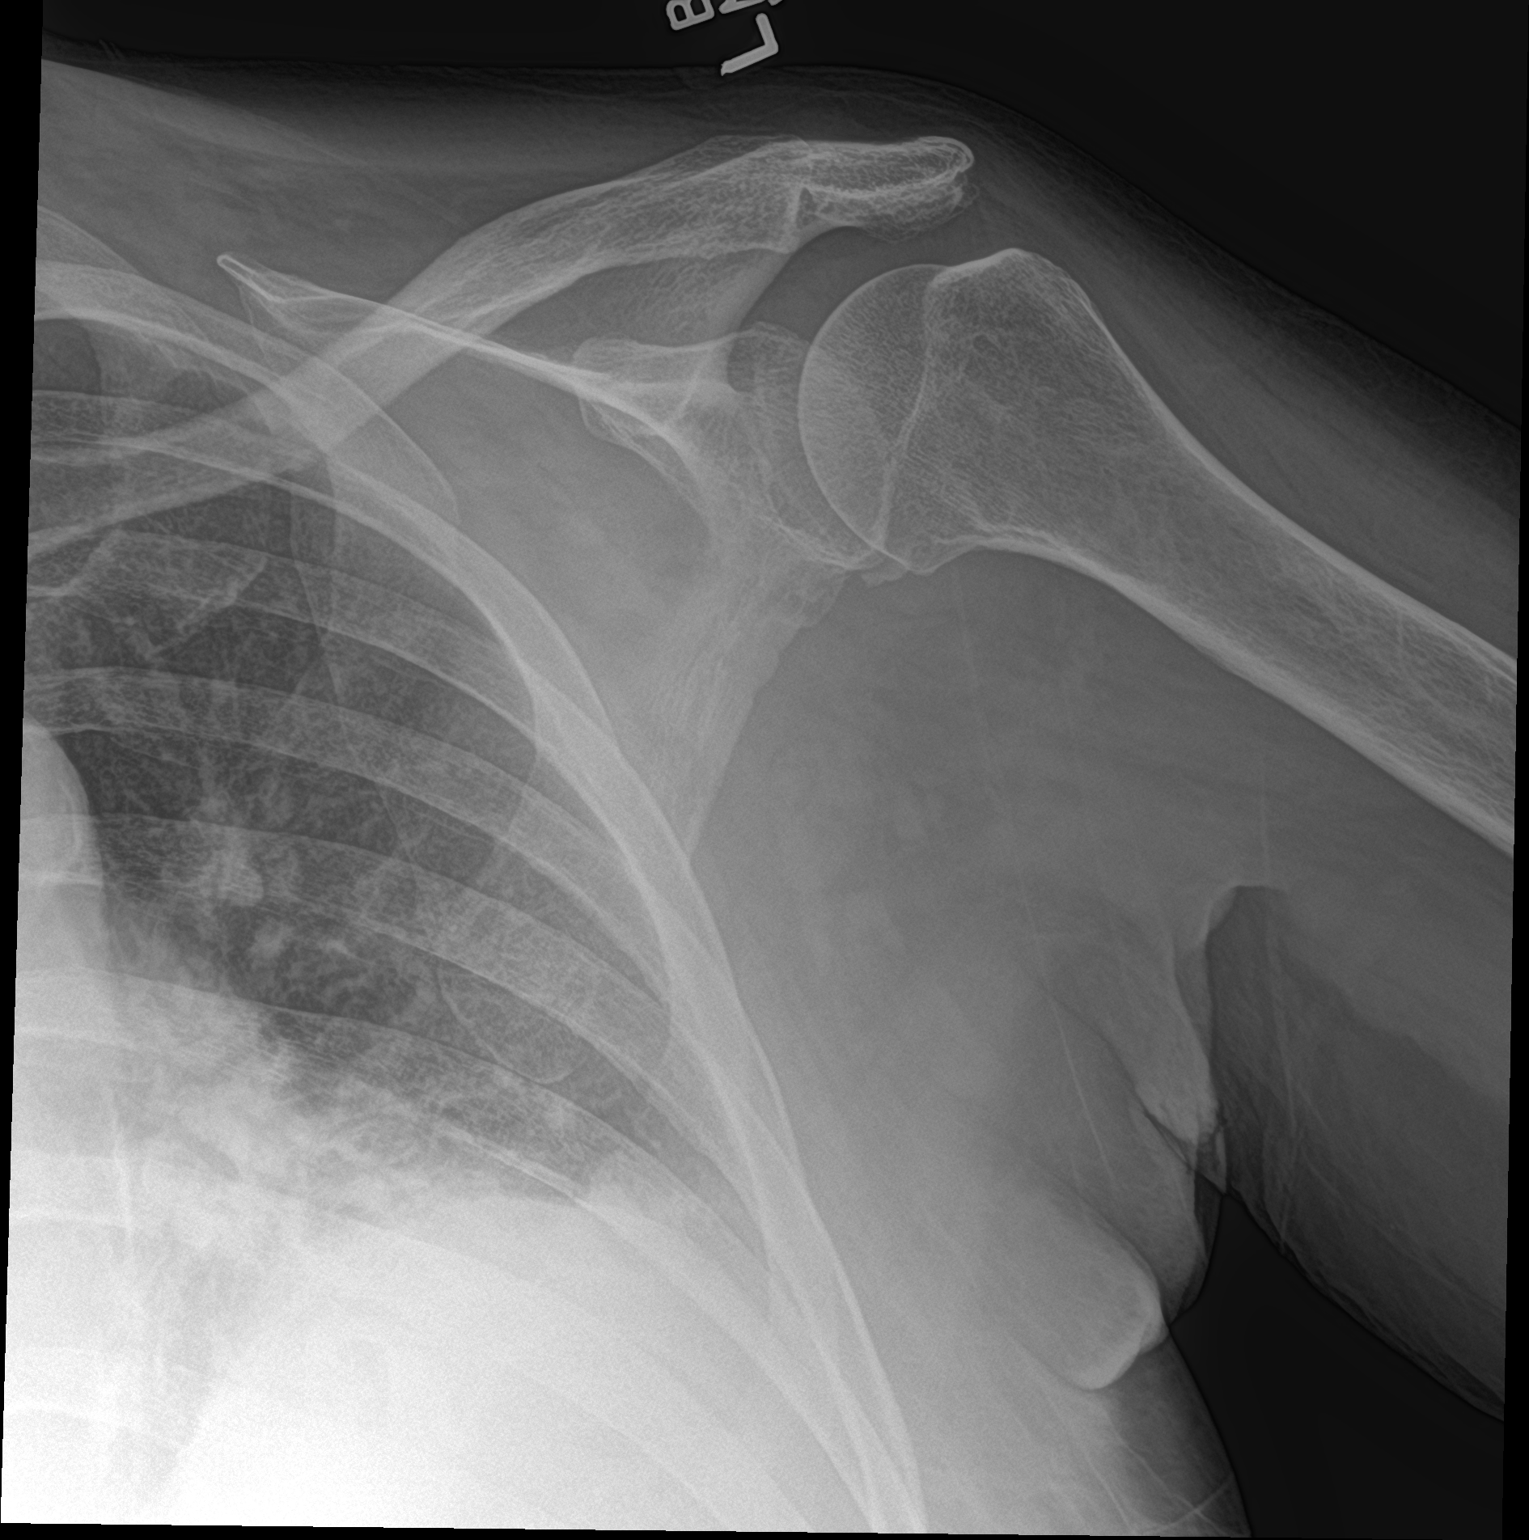

[shoulder y view]
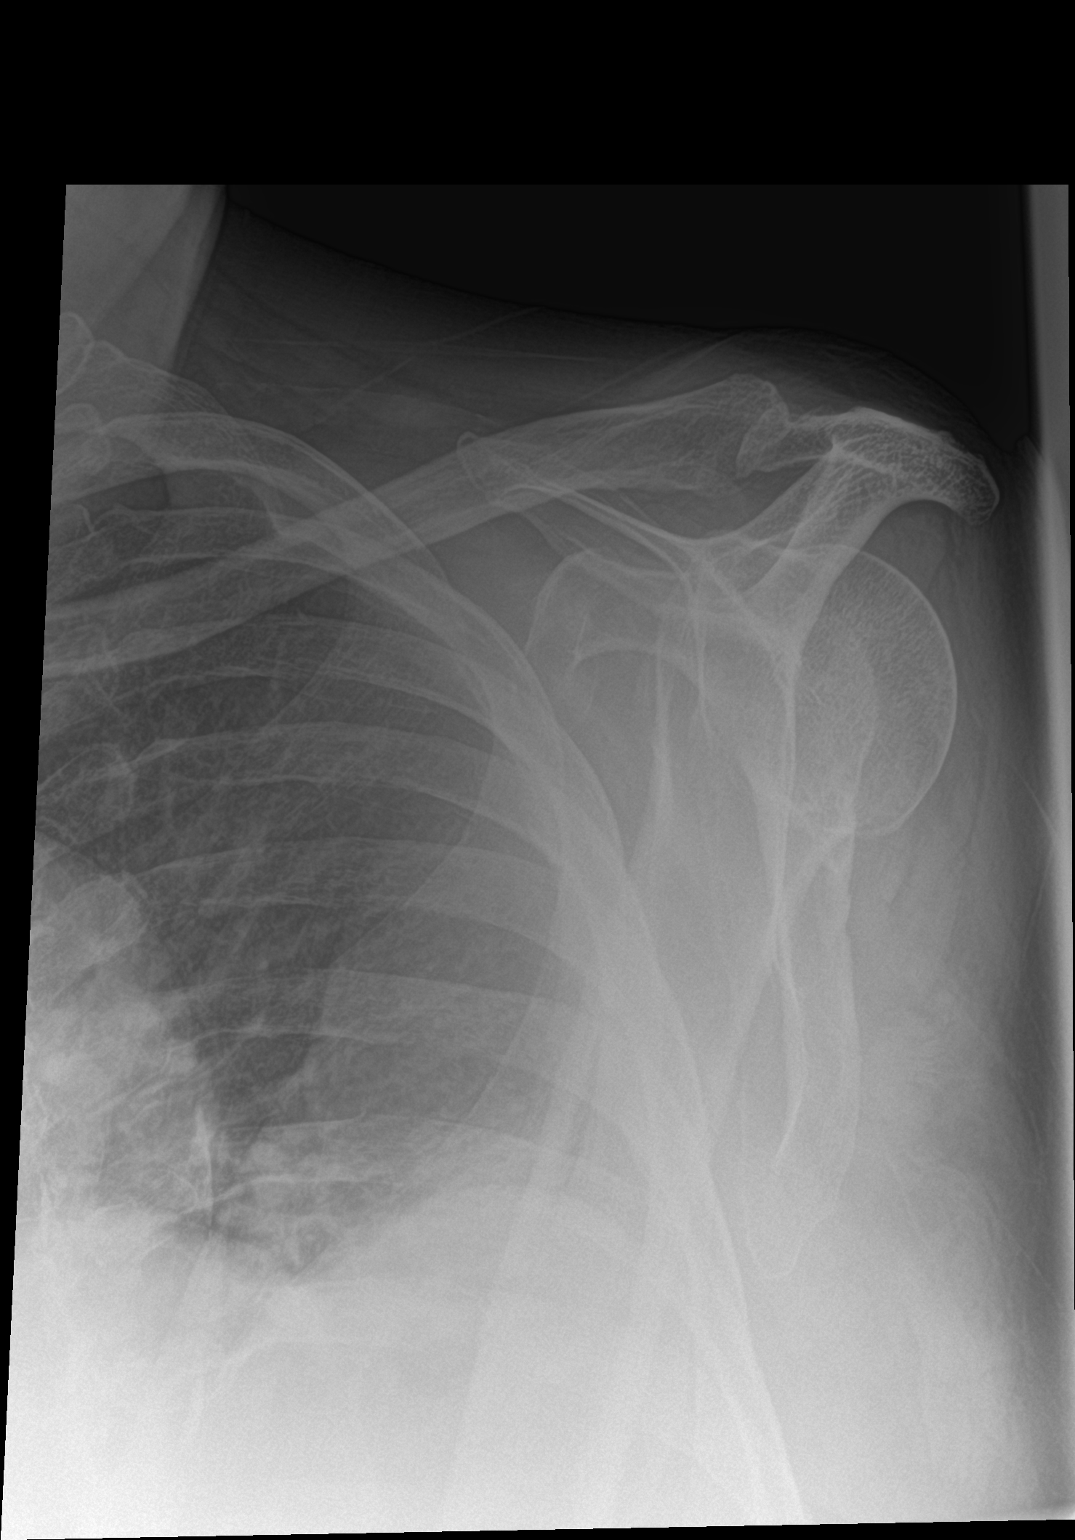

[2 of 2 positions shown; findings below may reference images not displayed]

FINDINGS: There is no evidence of fracture or dislocation. Mild degenerative
changes. Small mineralized density inferior to the glenoid is
unchanged from prior x-ray 04/22/2017, and may reflect a small
intra-articular loose body. Soft tissues are unremarkable. Streaky
left basilar opacity.
IMPRESSION: 1. No acute osseous abnormality.
2. Mild degenerative changes of the left shoulder.
3. Streaky left basilar opacity.

## 2021-12-14 IMAGING — DX DG CHEST 2V
2 series · 2 of 2 positions shown · non-contrast
Comparison: 04/22/2017

CLINICAL DATA: Fall, dementia

EXAM:
CHEST - 2 VIEW

[chest lat]
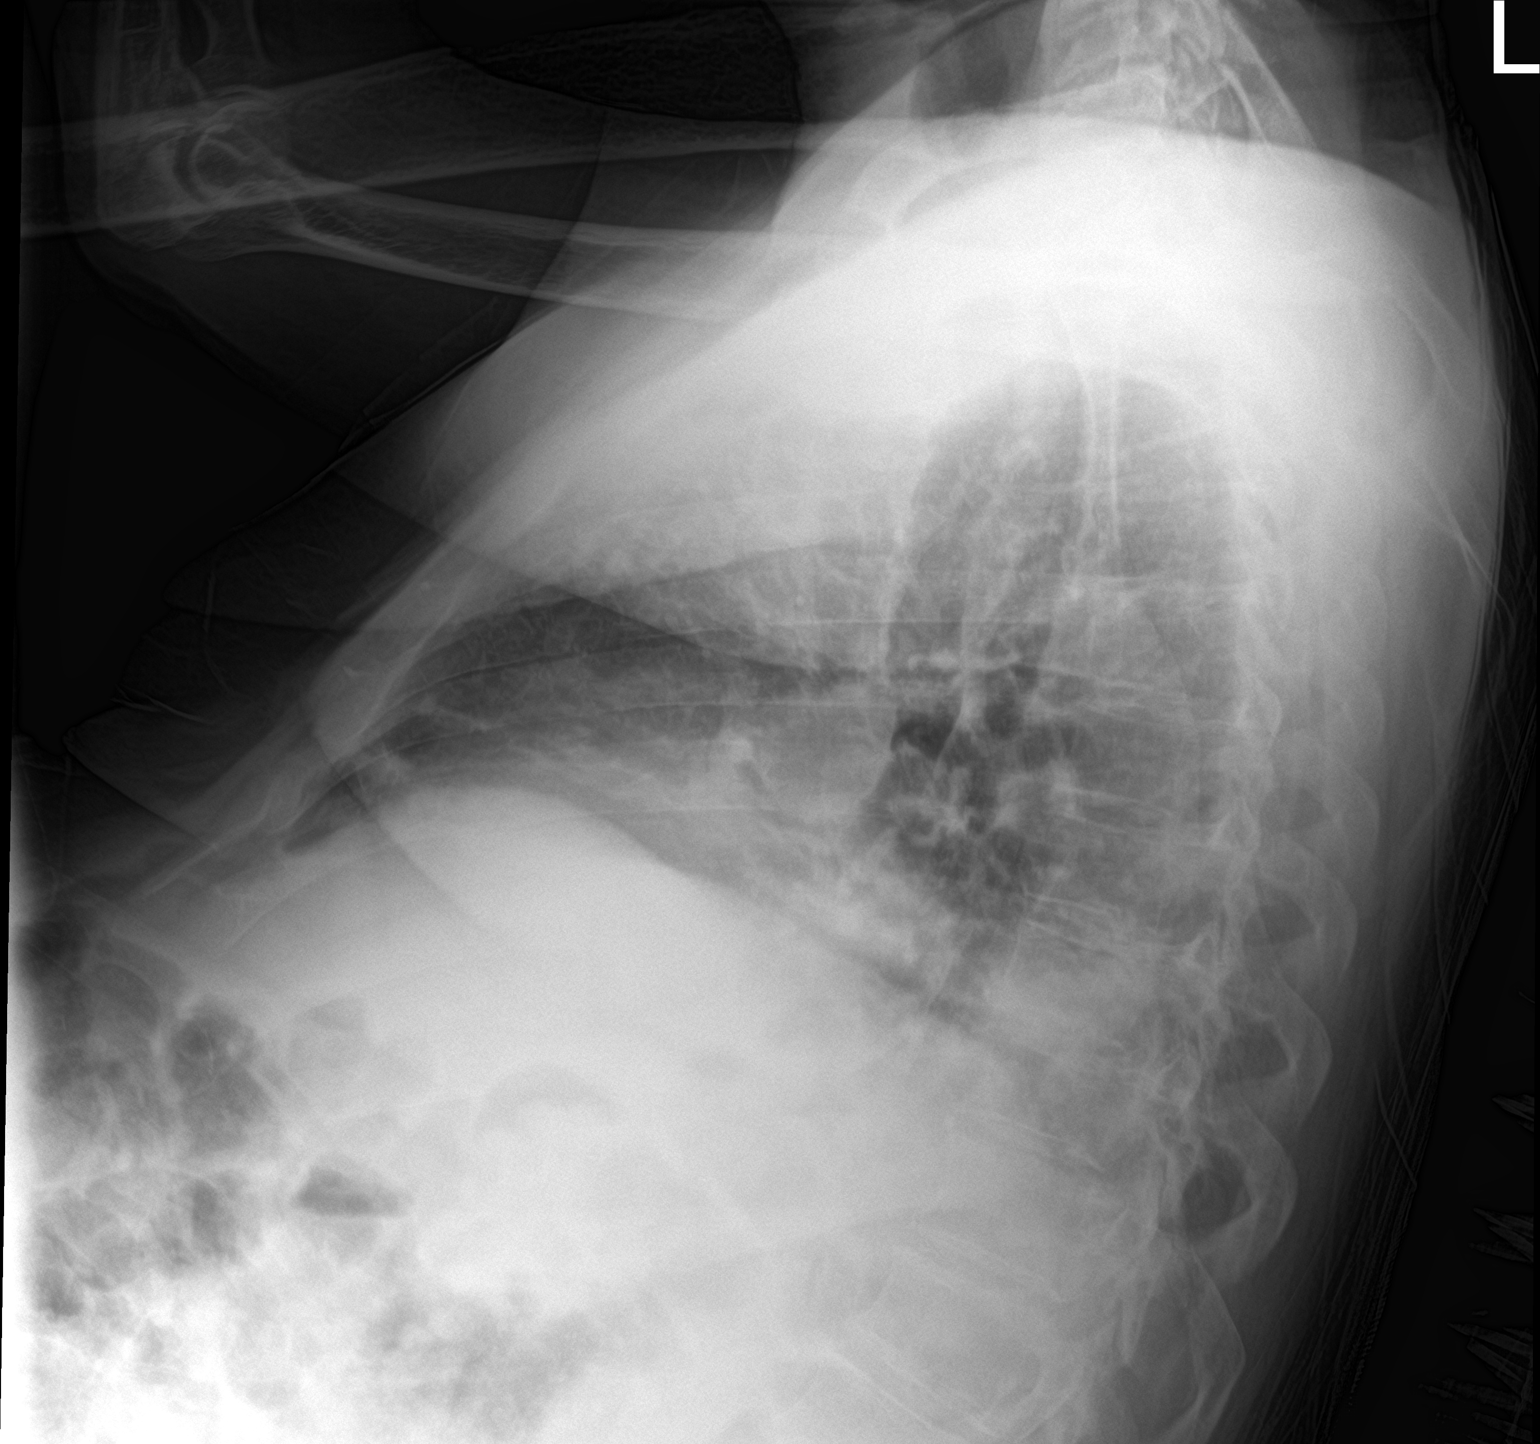

[chest ap]
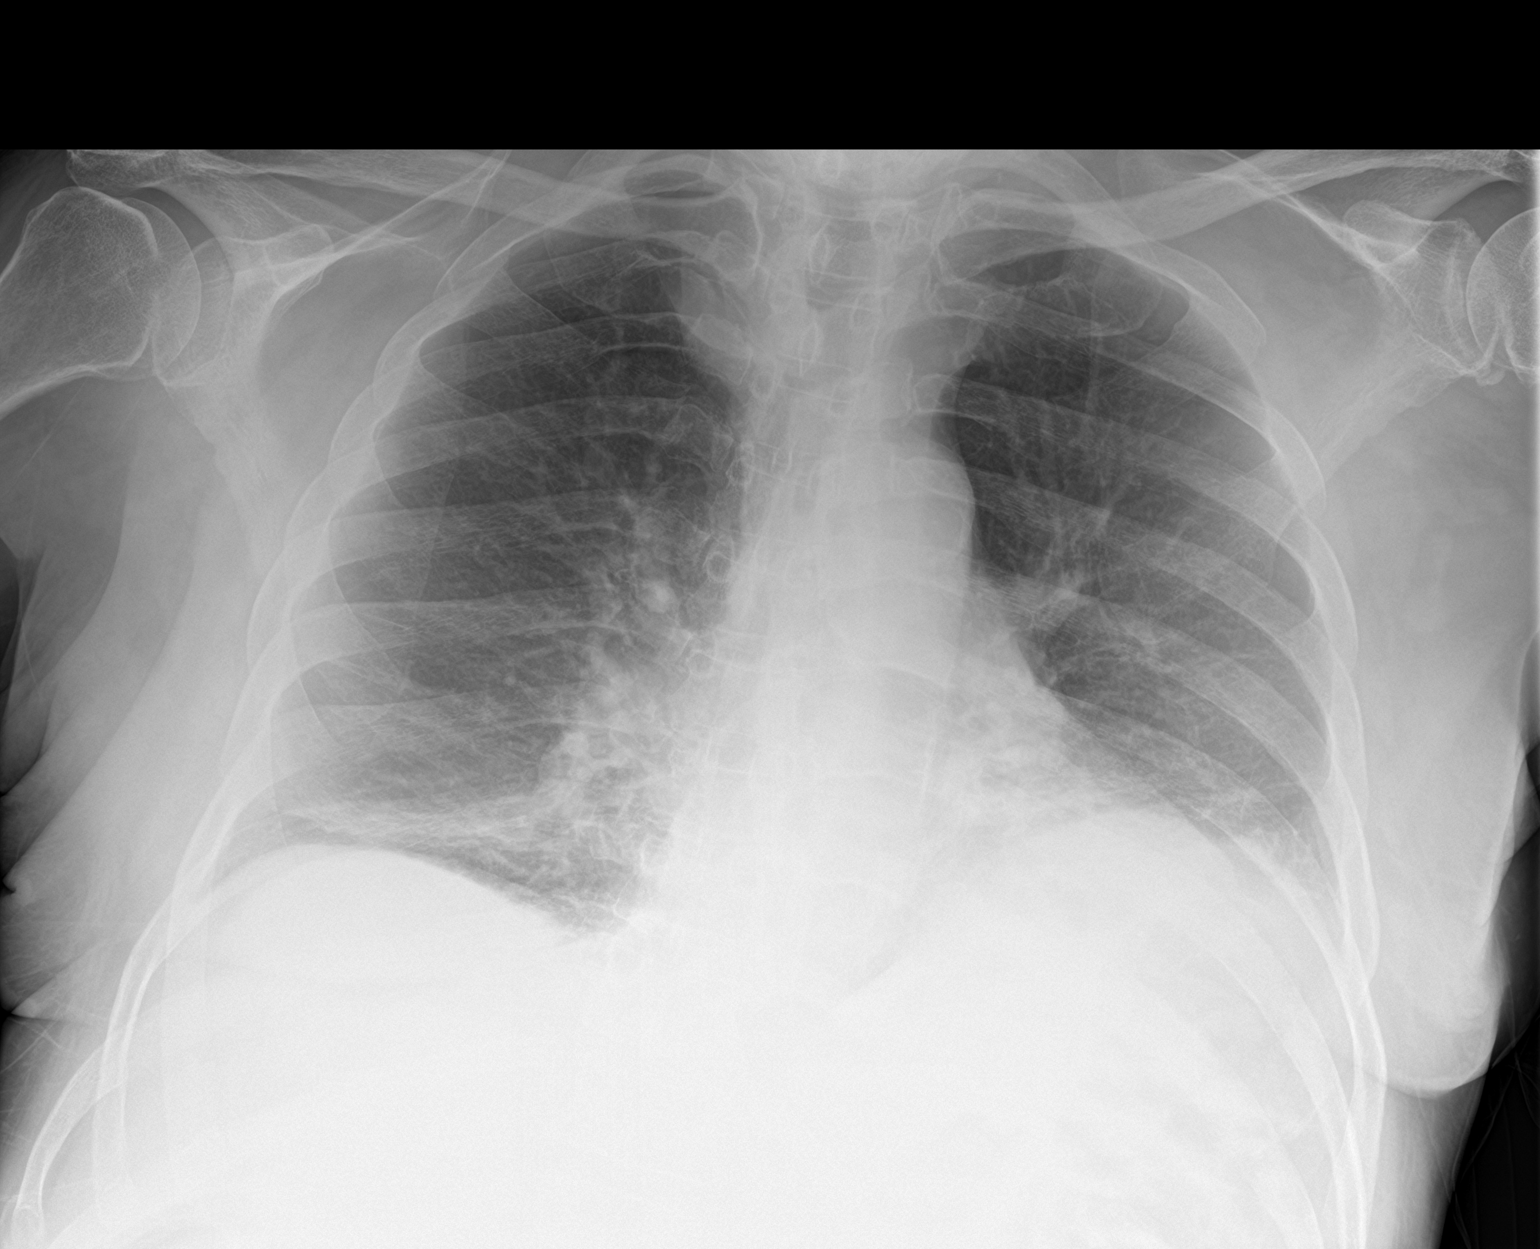

[2 of 2 positions shown; findings below may reference images not displayed]

FINDINGS: The heart size and mediastinal contours are within normal limits.
Low lung volumes with streaky bibasilar opacities. No pleural
effusion or pneumothorax. No acute osseous finding.
IMPRESSION: Low lung volumes with streaky bibasilar opacities, likely
atelectasis.

## 2022-04-02 ENCOUNTER — Encounter (HOSPITAL_COMMUNITY): Payer: Self-pay

## 2022-04-02 ENCOUNTER — Inpatient Hospital Stay (HOSPITAL_COMMUNITY)
Admission: EM | Admit: 2022-04-02 | Discharge: 2022-04-19 | DRG: 951 | Disposition: E | Payer: Medicare Other | Source: Skilled Nursing Facility | Attending: Internal Medicine | Admitting: Internal Medicine

## 2022-04-02 ENCOUNTER — Other Ambulatory Visit: Payer: Self-pay

## 2022-04-02 ENCOUNTER — Emergency Department (HOSPITAL_COMMUNITY): Payer: Medicare Other

## 2022-04-02 DIAGNOSIS — E118 Type 2 diabetes mellitus with unspecified complications: Secondary | ICD-10-CM | POA: Diagnosis present

## 2022-04-02 DIAGNOSIS — J9601 Acute respiratory failure with hypoxia: Secondary | ICD-10-CM | POA: Diagnosis present

## 2022-04-02 DIAGNOSIS — Z79899 Other long term (current) drug therapy: Secondary | ICD-10-CM | POA: Diagnosis not present

## 2022-04-02 DIAGNOSIS — E872 Acidosis, unspecified: Secondary | ICD-10-CM | POA: Diagnosis present

## 2022-04-02 DIAGNOSIS — N400 Enlarged prostate without lower urinary tract symptoms: Secondary | ICD-10-CM | POA: Diagnosis present

## 2022-04-02 DIAGNOSIS — I10 Essential (primary) hypertension: Secondary | ICD-10-CM | POA: Diagnosis present

## 2022-04-02 DIAGNOSIS — Z7984 Long term (current) use of oral hypoglycemic drugs: Secondary | ICD-10-CM | POA: Diagnosis not present

## 2022-04-02 DIAGNOSIS — Z66 Do not resuscitate: Secondary | ICD-10-CM

## 2022-04-02 DIAGNOSIS — K219 Gastro-esophageal reflux disease without esophagitis: Secondary | ICD-10-CM | POA: Diagnosis present

## 2022-04-02 DIAGNOSIS — Z515 Encounter for palliative care: Secondary | ICD-10-CM | POA: Diagnosis not present

## 2022-04-02 DIAGNOSIS — F209 Schizophrenia, unspecified: Secondary | ICD-10-CM | POA: Diagnosis present

## 2022-04-02 DIAGNOSIS — N3281 Overactive bladder: Secondary | ICD-10-CM | POA: Diagnosis present

## 2022-04-02 DIAGNOSIS — Z794 Long term (current) use of insulin: Secondary | ICD-10-CM

## 2022-04-02 DIAGNOSIS — G309 Alzheimer's disease, unspecified: Secondary | ICD-10-CM | POA: Diagnosis present

## 2022-04-02 DIAGNOSIS — F0390 Unspecified dementia without behavioral disturbance: Secondary | ICD-10-CM | POA: Diagnosis present

## 2022-04-02 DIAGNOSIS — E7800 Pure hypercholesterolemia, unspecified: Secondary | ICD-10-CM | POA: Diagnosis present

## 2022-04-02 DIAGNOSIS — J449 Chronic obstructive pulmonary disease, unspecified: Secondary | ICD-10-CM | POA: Diagnosis present

## 2022-04-02 DIAGNOSIS — F0280 Dementia in other diseases classified elsewhere without behavioral disturbance: Secondary | ICD-10-CM | POA: Diagnosis present

## 2022-04-02 DIAGNOSIS — R0902 Hypoxemia: Secondary | ICD-10-CM

## 2022-04-02 DIAGNOSIS — F17210 Nicotine dependence, cigarettes, uncomplicated: Secondary | ICD-10-CM | POA: Diagnosis present

## 2022-04-02 DIAGNOSIS — J690 Pneumonitis due to inhalation of food and vomit: Secondary | ICD-10-CM | POA: Diagnosis present

## 2022-04-02 DIAGNOSIS — Z20822 Contact with and (suspected) exposure to covid-19: Secondary | ICD-10-CM | POA: Diagnosis present

## 2022-04-02 LAB — COMPREHENSIVE METABOLIC PANEL
ALT: 465 U/L — ABNORMAL HIGH (ref 0–44)
AST: 392 U/L — ABNORMAL HIGH (ref 15–41)
Albumin: 3.3 g/dL — ABNORMAL LOW (ref 3.5–5.0)
Alkaline Phosphatase: 155 U/L — ABNORMAL HIGH (ref 38–126)
Anion gap: 7 (ref 5–15)
BUN: 21 mg/dL (ref 8–23)
CO2: 30 mmol/L (ref 22–32)
Calcium: 9.1 mg/dL (ref 8.9–10.3)
Chloride: 105 mmol/L (ref 98–111)
Creatinine, Ser: 0.77 mg/dL (ref 0.61–1.24)
GFR, Estimated: >60 mL/min (ref >60)
Glucose, Bld: 260 mg/dL — ABNORMAL HIGH (ref 70–99)
Potassium: 3.8 mmol/L (ref 3.5–5.1)
Sodium: 142 mmol/L (ref 135–145)
Total Bilirubin: 0.3 mg/dL (ref 0.3–1.2)
Total Protein: 6.6 g/dL (ref 6.5–8.1)

## 2022-04-02 LAB — RESP PANEL BY RT-PCR (FLU A&B, COVID) ARPGX2
Influenza A by PCR: NEGATIVE
Influenza B by PCR: NEGATIVE
SARS Coronavirus 2 by RT PCR: NEGATIVE

## 2022-04-02 LAB — CBC WITH DIFFERENTIAL/PLATELET
Abs Immature Granulocytes: 0 10*3/uL (ref 0.00–0.07)
Basophils Absolute: 0 10*3/uL (ref 0.0–0.1)
Basophils Relative: 0 %
Eosinophils Absolute: 0.1 10*3/uL (ref 0.0–0.5)
Eosinophils Relative: 1 %
HCT: 40.1 % (ref 39.0–52.0)
Hemoglobin: 12.4 g/dL — ABNORMAL LOW (ref 13.0–17.0)
Lymphocytes Relative: 58 %
Lymphs Abs: 5.6 10*3/uL — ABNORMAL HIGH (ref 0.7–4.0)
MCH: 34.9 pg — ABNORMAL HIGH (ref 26.0–34.0)
MCHC: 30.9 g/dL (ref 30.0–36.0)
MCV: 113 fL — ABNORMAL HIGH (ref 80.0–100.0)
Monocytes Absolute: 1 10*3/uL (ref 0.1–1.0)
Monocytes Relative: 10 %
Neutro Abs: 3 10*3/uL (ref 1.7–7.7)
Neutrophils Relative %: 31 %
Platelets: 276 10*3/uL (ref 150–400)
RBC: 3.55 MIL/uL — ABNORMAL LOW (ref 4.22–5.81)
RDW: 14.4 % (ref 11.5–15.5)
WBC: 9.7 10*3/uL (ref 4.0–10.5)
nRBC: 0 % (ref 0.0–0.2)

## 2022-04-02 LAB — BLOOD GAS, ARTERIAL
Acid-base deficit: 2.6 mmol/L — ABNORMAL HIGH (ref 0.0–2.0)
Bicarbonate: 32.7 mmol/L — ABNORMAL HIGH (ref 20.0–28.0)
Drawn by: 27016
O2 Saturation: 65.2 %
Patient temperature: 36.3
pCO2 arterial: >123 mmHg (ref 32–48)
pH, Arterial: 6.98 — CL (ref 7.35–7.45)
pO2, Arterial: 50 mmHg — ABNORMAL LOW (ref 83–108)

## 2022-04-02 LAB — POC OCCULT BLOOD, ED: Fecal Occult Bld: NEGATIVE

## 2022-04-02 MED ORDER — GLYCOPYRROLATE 1 MG PO TABS: 1.0000 mg | ORAL_TABLET | ORAL | Status: DC | PRN

## 2022-04-02 MED ORDER — POLYVINYL ALCOHOL 1.4 % OP SOLN: 1.0000 [drp] | Freq: Four times a day (QID) | OPHTHALMIC | Status: DC | PRN

## 2022-04-02 MED ORDER — HALOPERIDOL LACTATE 5 MG/ML IJ SOLN
0.5000 mg | INTRAMUSCULAR | Status: DC | PRN
  Administered 2022-04-02: 0.5 mg via INTRAVENOUS
  Filled 2022-04-02: qty 1

## 2022-04-02 MED ORDER — SODIUM CHLORIDE 0.9 % IV SOLN: 250.0000 mL | INTRAVENOUS | Status: DC | PRN

## 2022-04-02 MED ORDER — MORPHINE SULFATE (CONCENTRATE) 10 MG/0.5ML PO SOLN: 5.0000 mg | ORAL | Status: DC | PRN

## 2022-04-02 MED ORDER — ACETAMINOPHEN 325 MG PO TABS: 650.0000 mg | ORAL_TABLET | Freq: Four times a day (QID) | ORAL | Status: DC | PRN

## 2022-04-02 MED ORDER — BIOTENE DRY MOUTH MT LIQD: 15.0000 mL | OROMUCOSAL | Status: DC | PRN

## 2022-04-02 MED ORDER — HALOPERIDOL LACTATE 2 MG/ML PO CONC: 0.5000 mg | ORAL | Status: DC | PRN

## 2022-04-02 MED ORDER — ONDANSETRON 4 MG PO TBDP: 4.0000 mg | ORAL_TABLET | Freq: Four times a day (QID) | ORAL | Status: DC | PRN

## 2022-04-02 MED ORDER — GLYCOPYRROLATE 0.2 MG/ML IJ SOLN: 0.2000 mg | INTRAMUSCULAR | Status: DC | PRN

## 2022-04-02 MED ORDER — MORPHINE SULFATE (PF) 4 MG/ML IV SOLN
4.0000 mg | INTRAVENOUS | Status: DC | PRN
  Administered 2022-04-02: 4 mg via INTRAVENOUS
  Filled 2022-04-02: qty 1

## 2022-04-02 MED ORDER — ALBUTEROL SULFATE (2.5 MG/3ML) 0.083% IN NEBU: 2.5000 mg | INHALATION_SOLUTION | RESPIRATORY_TRACT | Status: DC | PRN

## 2022-04-02 MED ORDER — HALOPERIDOL 0.5 MG PO TABS: 0.5000 mg | ORAL_TABLET | ORAL | Status: DC | PRN

## 2022-04-02 MED ORDER — SODIUM CHLORIDE 0.9 % IV SOLN: 12.5000 mg | Freq: Four times a day (QID) | INTRAVENOUS | Status: DC | PRN

## 2022-04-02 MED ORDER — SODIUM CHLORIDE 0.9% FLUSH: 3.0000 mL | INTRAVENOUS | Status: DC | PRN

## 2022-04-02 MED ORDER — GLYCOPYRROLATE 0.2 MG/ML IJ SOLN
0.2000 mg | INTRAMUSCULAR | Status: DC | PRN
  Administered 2022-04-02: 0.2 mg via INTRAVENOUS
  Filled 2022-04-02: qty 1

## 2022-04-02 MED ORDER — SODIUM CHLORIDE 0.9% FLUSH: 3.0000 mL | Freq: Two times a day (BID) | INTRAVENOUS | Status: DC

## 2022-04-02 MED ORDER — ACETAMINOPHEN 650 MG RE SUPP: 650.0000 mg | Freq: Four times a day (QID) | RECTAL | Status: DC | PRN

## 2022-04-02 MED ORDER — ONDANSETRON HCL 4 MG/2ML IJ SOLN: 4.0000 mg | Freq: Four times a day (QID) | INTRAMUSCULAR | Status: DC | PRN

## 2022-04-19 NOTE — ED Triage Notes (Signed)
Pt brought to ED via [facility] for respiratory distress from [facility]. Pt was sitting up, eating lunch, started choking on puree, started vomiting then aspirated and became unresponsive. CBG 297. Pt unresponsive upon arrival to ED. EDP and respiratory at bedside.

## 2022-04-19 NOTE — Death Summary Note (Signed)
Death Summary  Andre Rowe HFW:[id number] DOB: Jan 25, 1941 DOA: April 13, 2022  PCP: Kari Baars, MD  Admit date: 04/13/2022 Date of Death: 2022-04-13 Time of Death: 17:40 Notification: Kari Baars, MD notified of death of 04-13-22   History of present illness:  Andre Rowe is a 81 y.o. male with a history of COPD, dementia, diabetes type 2, hypertension and schizophrenia Andre Rowe presented with complaint of [place] distress Lyndell Allaire did not improve after attempts to reoxygenate him but respect his wishes for a DNR/DNI status  Patient presented to the emergency department severely hypoxemic on 100% nonrebreather he was a DO NOT RESUSCITATE DO NOT INTUBATE and therefore attempts were made to oxygenate him but were unsuccessful.  Final Diagnoses:  1.   Respiratory failure  2.  Aspiration pneumonitis  3.  Dementia   The results of significant diagnostics from this hospitalization (including imaging, microbiology, ancillary and laboratory) are listed below for reference.    Significant Diagnostic Studies: DG Chest Port 1 View  Result Date: Apr 13, 2022 CLINICAL DATA:  Hypoxia EXAM: PORTABLE CHEST 1 VIEW COMPARISON:  Chest 11/27/2019 FINDINGS: Elevated left hemidiaphragm with left lower lobe airspace disease. Mild progression from the prior study. Right lung is clear. Negative for heart failure or edema. No pneumothorax. IMPRESSION: Progression of elevated left hemidiaphragm and probable left lower lobe atelectasis. Right lung remains clear Electronically Signed   By: Marlan Palau M.D.   On: Apr 13, 2022 15:05    Microbiology: Recent Results (from the past 240 hour(s))  Resp Panel by RT-PCR (Flu A&B, Covid) Anterior Nasal Swab     Status: None   Collection Time: 04/13/2022  3:19 PM   Specimen: Anterior Nasal Swab  Result Value Ref Range Status   SARS Coronavirus 2 by RT PCR NEGATIVE NEGATIVE Final    Comment: (NOTE) SARS-CoV-2 target nucleic acids are  NOT DETECTED.  The SARS-CoV-2 RNA is generally detectable in upper respiratory specimens during the acute phase of infection. The lowest concentration of SARS-CoV-2 viral copies this assay can detect is 138 copies/mL. A negative result does not preclude SARS-Cov-2 infection and should not be used as the sole basis for treatment or other patient management decisions. A negative result may occur with  improper specimen collection/handling, submission of specimen other than nasopharyngeal swab, presence of viral mutation(s) within the areas targeted by this assay, and inadequate number of viral copies(<138 copies/mL). A negative result must be combined with clinical observations, patient history, and epidemiological information. The expected result is Negative.  Fact Sheet for Patients:  BloggerCourse.com  Fact Sheet for Healthcare Providers:  SeriousBroker.it  This test is no t yet approved or cleared by the [place] FDA and  has been authorized for detection and/or diagnosis of SARS-CoV-2 by FDA under an Emergency Use Authorization (EUA). This EUA will remain  in effect (meaning this test can be used) for the duration of the COVID-19 declaration under Section 564(b)(1) of the Act, 21 U.S.C.section 360bbb-3(b)(1), unless the authorization is terminated  or revoked sooner.       Influenza A by PCR NEGATIVE NEGATIVE Final   Influenza B by PCR NEGATIVE NEGATIVE Final    Comment: (NOTE) The Xpert Xpress SARS-CoV-2/FLU/RSV plus assay is intended as an aid in the diagnosis of influenza from Nasopharyngeal swab specimens and should not be used as a sole basis for treatment. Nasal washings and aspirates are unacceptable for Xpert Xpress SARS-CoV-2/FLU/RSV testing.  Fact Sheet for Patients: BloggerCourse.com  Fact Sheet for Healthcare Providers: SeriousBroker.it  This test is not  yet approved or cleared by the [place] and has been authorized for detection and/or diagnosis of SARS-CoV-2 by FDA under an Emergency Use Authorization (EUA). This EUA will remain in effect (meaning this test can be used) for the duration of the COVID-19 declaration under Section 564(b)(1) of the Act, 21 U.S.C. section 360bbb-3(b)(1), unless the authorization is terminated or revoked.  Performed at [facility], [address], [place], [place] [address]      Labs: Basic Metabolic Panel: Recent Labs  Lab 25-Apr-2022 1459  NA 142  K 3.8  CL 105  CO2 30  GLUCOSE 260*  BUN 21  CREATININE 0.77  CALCIUM 9.1   Liver Function Tests: Recent Labs  Lab 04/25/22 1459  AST 392*  ALT 465*  ALKPHOS 155*  BILITOT 0.3  PROT 6.6  ALBUMIN 3.3*    CBC: Recent Labs  Lab 2022/04/25 1459  WBC 9.7  NEUTROABS 3.0  HGB 12.4*  HCT 40.1  MCV 113.0*  PLT 276    Urinalysis    Component Value Date/Time   COLORURINE YELLOW 11/27/2019 1517   APPEARANCEUR CLEAR 11/27/2019 1517   LABSPEC 1.021 11/27/2019 1517   PHURINE 5.0 11/27/2019 1517   GLUCOSEU 150 (A) 11/27/2019 1517   HGBUR SMALL (A) 11/27/2019 1517   BILIRUBINUR NEGATIVE 11/27/2019 1517   KETONESUR NEGATIVE 11/27/2019 1517   PROTEINUR NEGATIVE 11/27/2019 1517   UROBILINOGEN 0.2 12/08/2014 1210   NITRITE NEGATIVE 11/27/2019 1517   LEUKOCYTESUR NEGATIVE 11/27/2019 1517   Sepsis Labs Recent Labs  Lab 2022/04/25 1459  WBC 9.7       SIGNED:  Lahoma Crocker, MD FACP  [facility] 25-Apr-2022, 6:29 PM Pager   If 7PM-7AM, please contact night-coverage [email or web address] Password [email or web address]

## 2022-04-19 NOTE — ED Provider Notes (Signed)
[facility] EMERGENCY DEPARTMENT Provider Note   CSN: [id number] Arrival date & time: 2022-04-26  1432     History  Chief Complaint  Patient presents with   Respiratory Distress    Andre Rowe is a 81 y.o. male.  HPI Patient was at his nursing care facility today, when he experienced choking followed by respiratory distress.  Staff attended him and gave him the Heimlich maneuver but he got worse, stopped talking and seemed to have trouble breathing.  EMS arrived and found him to have a very low oxygen saturation less than 50%.  He is DNR and was transferred here with an oxygen mask, administering 100% oxygen.  Is unable to give history.  His legal guardian arrived at around 2:45 PM.  Patient is confirmed DNR/DNI, and apparently has chronic aspirations.  His legal guardian understands that he has a likely terminal process.    Home Medications Prior to Admission medications   Medication Sig Start Date End Date Taking? Authorizing Provider  acetaminophen (TYLENOL) 325 MG tablet Take 650 mg by mouth every 6 (six) hours as needed for mild pain or moderate pain.    [provider]  carbamazepine (TEGRETOL) 200 MG tablet Take 200 mg by mouth 3 (three) times daily.    [provider]  clonazePAM (KLONOPIN) 0.5 MG tablet Take 1 tablet (0.5 mg total) by mouth 2 (two) times daily. 12/09/19   Lavina Hamman, MD  donepezil (ARICEPT) 10 MG tablet Take 10 mg by mouth at bedtime.    [provider]  folic acid (FOLVITE) 1 MG tablet Take 1 mg by mouth daily.    [provider]  insulin detemir (LEVEMIR) 100 UNIT/ML injection Inject 0.16 mLs (16 Units total) into the skin every morning. 12/09/19   Lavina Hamman, MD  ketoconazole (NIZORAL) 2 % cream Apply 1 application topically 2 (two) times daily as needed for irritation.    [provider]  latanoprost (XALATAN) 0.005 % ophthalmic solution Place 1 drop into both eyes at bedtime.    [provider]  memantine (NAMENDA) 10 MG tablet Take 10 mg by mouth 2 (two) times daily.    [provider]  metFORMIN (GLUCOPHAGE) 1000 MG tablet TAKE 1 TABLET BY MOUTH TWICE DAILY. Patient taking differently: Take 1,000 mg by mouth 2 (two) times daily with a meal.  12/22/18   [name], Marella Chimes, MD  OLANZapine (ZYPREXA) 10 MG tablet Take 10 mg by mouth at bedtime.    [provider]  omeprazole (PRILOSEC) 20 MG capsule Take 20 mg by mouth daily.    [provider]  oxybutynin (DITROPAN) 5 MG tablet Take 5 mg by mouth 2 (two) times daily.    [provider]  polyethylene glycol powder (GLYCOLAX/MIRALAX) 17 GM/SCOOP powder Take 17 g by mouth daily. Mixed with 8 ounces of water or juice and drink once daily    [provider]  senna-docusate (SENOKOT-S) 8.6-50 MG tablet Take 1 tablet by mouth at bedtime.    [provider]  simvastatin (ZOCOR) 40 MG tablet Take 40 mg by mouth daily.    [provider]  tamsulosin (FLOMAX) 0.4 MG CAPS capsule Take 0.4 mg by mouth daily.    [provider]  thiamine 100 MG tablet Take 1 tablet (100 mg total) by mouth daily. 12/10/19   Lavina Hamman, MD  triamcinolone cream (KENALOG) 0.5 % Apply 1 application topically 2 (two) times daily as needed (applied between toes).  [provider]  Vitamin D, Ergocalciferol, (DRISDOL) 1.25 MG (50000 UT) CAPS capsule TAKE 1 TABLET BY MOUTH ONCE WEEKLY. Patient taking differently: Take 50,000 Units by mouth every 7 (seven) days.  08/26/18   Cassandria Anger, MD      Allergies    Patient has no known allergies.    Review of Systems   Review of Systems  Physical Exam Updated Vital Signs BP (!) 108/48   Pulse 88   Temp (!) 97.3 F (36.3 C) (Rectal)   Resp (!) 26   Ht 6' (1.829 m)   Wt 79.4 kg   SpO2 (!) 78%   BMI 23.73 kg/m  Physical Exam Vitals and nursing note reviewed.  Constitutional:      General: He is in acute  distress.     Appearance: He is well-developed. He is ill-appearing. He is not diaphoretic.  HENT:     Head: Normocephalic and atraumatic.     Right Ear: External ear normal.     Left Ear: External ear normal.  Eyes:     Conjunctiva/sclera: Conjunctivae normal.     Pupils: Pupils are equal, round, and reactive to light.  Cardiovascular:     Rate and Rhythm: Normal rate and regular rhythm.     Heart sounds: Normal heart sounds.  Pulmonary:     Effort: Pulmonary effort is normal. No respiratory distress.     Breath sounds: No stridor. No rhonchi.     Comments: Tachypneic Abdominal:     General: There is no distension.     Palpations: Abdomen is soft. There is no mass.     Tenderness: There is no abdominal tenderness.  Musculoskeletal:        General: Normal range of motion.     Cervical back: Normal range of motion and neck supple.  Skin:    General: Skin is warm.  Neurological:     Mental Status: He is alert.     Motor: No abnormal muscle tone.     Comments: Not responsive to sternal rub.  Psychiatric:     Comments: Obtunded     ED Results / Procedures / Treatments   Labs (all labs ordered are listed, but only abnormal results are displayed) Labs Reviewed  BLOOD GAS, ARTERIAL - Abnormal; Notable for the following components:      Result Value   pH, Arterial 6.98 (*)    pCO2 arterial >123 (*)    pO2, Arterial 50 (*)    Bicarbonate 32.7 (*)    Acid-base deficit 2.6 (*)    All other components within normal limits  COMPREHENSIVE METABOLIC PANEL - Abnormal; Notable for the following components:   Glucose, Bld 260 (*)    Albumin 3.3 (*)    AST 392 (*)    ALT 465 (*)    Alkaline Phosphatase 155 (*)    All other components within normal limits  CBC WITH DIFFERENTIAL/PLATELET - Abnormal; Notable for the following components:   RBC 3.55 (*)    Hemoglobin 12.4 (*)    MCV 113.0 (*)    MCH 34.9 (*)    Lymphs Abs 5.6 (*)    All other components within normal limits  RESP  PANEL BY RT-PCR (FLU A&B, COVID) [id number]  POC OCCULT BLOOD, ED    EKG None  Radiology DG Chest Port 1 View  Result Date: 04-30-2022 CLINICAL DATA:  Hypoxia EXAM: PORTABLE CHEST 1 VIEW COMPARISON:  Chest 11/27/2019 FINDINGS: Elevated left hemidiaphragm with left lower lobe airspace  disease. Mild progression from the prior study. Right lung is clear. Negative for heart failure or edema. No pneumothorax. IMPRESSION: Progression of elevated left hemidiaphragm and probable left lower lobe atelectasis. Right lung remains clear Electronically Signed   By: Franchot Gallo M.D.   On: 2022/04/26 15:05    Procedures Procedures    Medications Ordered in ED Medications - No data to display  ED Course/ Medical Decision Making/ A&P                           Medical Decision Making Patient presenting with suspected aspiration, while eating, with hypoxia.  He is DNR/DNI.  Oxygen saturation very low but improved to 73 with bagging.  Then placed on nasal cannula oxygen.  Initiated terminal care with comfort measures.  Problems Addressed: Hypoxia: acute illness or injury  Amount and/or Complexity of Data Reviewed Labs: ordered.    Details: cbc, cmet, abg --pH low, PCO2 high, bicarb high, PO2 low, glucose high, albumin low, AST high, ALT high, alk phos stays high Radiology: ordered.    Details: CXR-no infiltrate or edema Discussion of management or test interpretation with external provider(s): Patient's legal guardian arrived and we discussed the management and findings in the room.  He is agreeable to comfort care for this patient at this time and confirms that he is DNR/DNI.  Risk Decision regarding hospitalization. Risk Details: Patient with terminal process, aspiration with severe acidemia and hypoxia.  He is unlikely to survive this process.  He to be a risk to put on BiPAP because of aspiration therefore will not be placed on BiPAP.  Dissipate admission.           Final Clinical  Impression(s) / ED Diagnoses Final diagnoses:  Aspiration pneumonia, unspecified aspiration pneumonia type, unspecified laterality, unspecified part of lung ([place])  Hypoxia  DNR (do not resuscitate)    Rx / DC Orders ED Discharge Orders     None         Daleen Bo, MD 04/26/2022 1605

## 2022-04-19 NOTE — H&P (Signed)
History and Physical    Lantz Hermann BOF:[id number] DOB: 19-Aug-1941 DOA: April 30, 2022  PCP: Kari Baars, MD  Patient coming from: SNF where he lives  I have personally briefly reviewed patient's old medical records in [facility]  Chief Complaint: Respiratory Distress  HPI: Andre Rowe is a 81 y.o. male with medical history significant of COPD, Dementia, DM type 2, HTN, and schizophrenia with a guardian who presented in respiratory distress after he experienced a choking episode at the SNF. He was given abdominal thrusts but unfortunately worsened and developed trouble breathing..  When EMS arrived his oxygen sat was less than 50%. He is DNR and was transferred to the ED with 100%O2 via FM.  He is unable to give any history but his legal guardian came to the ED and confirmed DNR/DNI with hx of chronic aspiration.  Legal guardian is aware this is likely a terminal process and requests comfort measures.   Review of Systems: Patient unable to participate due to medical condition  Past Medical History:  Diagnosis Date   Alzheimer disease (HCC)    BPH (benign prostatic hyperplasia)    COPD (chronic obstructive pulmonary disease) (HCC)    Dementia (HCC)    Diabetes mellitus without complication (HCC)    Diabetes mellitus, type II (HCC)    GERD (gastroesophageal reflux disease)    High cholesterol    Hyperlipidemia    Hypertension    Overactive bladder    Schizophrenia (HCC)    Unspecified dementia with behavioral disturbance     Past Surgical History:  Procedure Laterality Date   unable      Social History   Social History Narrative   ** Merged History Encounter **       ** Merged History Encounter **         reports that he has been smoking cigarettes. He has been smoking an average of .5 packs per day. He has never used smokeless tobacco. He reports that he does not drink alcohol and does not use drugs.  No Known Allergies  Family History  Family  history unknown: Yes     Prior to Admission medications   Medication Sig Start Date End Date Taking? Authorizing Provider  acetaminophen (TYLENOL) 325 MG tablet Take 650 mg by mouth every 6 (six) hours as needed for mild pain or moderate pain.    [provider]  carbamazepine (TEGRETOL) 200 MG tablet Take 200 mg by mouth 3 (three) times daily.    [provider]  clonazePAM (KLONOPIN) 0.5 MG tablet Take 1 tablet (0.5 mg total) by mouth 2 (two) times daily. 12/09/19   Rolly Salter, MD  donepezil (ARICEPT) 10 MG tablet Take 10 mg by mouth at bedtime.    [provider]  folic acid (FOLVITE) 1 MG tablet Take 1 mg by mouth daily.    [provider]  insulin detemir (LEVEMIR) 100 UNIT/ML injection Inject 0.16 mLs (16 Units total) into the skin every morning. 12/09/19   Rolly Salter, MD  ketoconazole (NIZORAL) 2 % cream Apply 1 application topically 2 (two) times daily as needed for irritation.    [provider]  latanoprost (XALATAN) 0.005 % ophthalmic solution Place 1 drop into both eyes at bedtime.    [provider]  memantine (NAMENDA) 10 MG tablet Take 10 mg by mouth 2 (two) times daily.    [provider]  metFORMIN (GLUCOPHAGE) 1000 MG tablet TAKE 1 TABLET BY MOUTH TWICE DAILY. Patient taking differently:  Take 1,000 mg by mouth 2 (two) times daily with a meal.  12/22/18   [name], [name], MD  OLANZapine (ZYPREXA) 10 MG tablet Take 10 mg by mouth at bedtime.    [provider]  omeprazole (PRILOSEC) 20 MG capsule Take 20 mg by mouth daily.    [provider]  oxybutynin (DITROPAN) 5 MG tablet Take 5 mg by mouth 2 (two) times daily.    [provider]  polyethylene glycol powder (GLYCOLAX/MIRALAX) 17 GM/SCOOP powder Take 17 g by mouth daily. Mixed with 8 ounces of water or juice and drink once daily    [provider]  senna-docusate (SENOKOT-S) 8.6-50 MG tablet Take 1 tablet by mouth at  bedtime.    [provider]  simvastatin (ZOCOR) 40 MG tablet Take 40 mg by mouth daily.    [provider]  tamsulosin (FLOMAX) 0.4 MG CAPS capsule Take 0.4 mg by mouth daily.    [provider]  thiamine 100 MG tablet Take 1 tablet (100 mg total) by mouth daily. 12/10/19   Rolly Salter, MD  triamcinolone cream (KENALOG) 0.5 % Apply 1 application topically 2 (two) times daily as needed (applied between toes).    [provider]  Vitamin D, Ergocalciferol, (DRISDOL) 1.25 MG (50000 UT) CAPS capsule TAKE 1 TABLET BY MOUTH ONCE WEEKLY. Patient taking differently: Take 50,000 Units by mouth every 7 (seven) days.  08/26/18   [name], MD    Physical Exam:  Constitutional: NAD, calm, comfortable Vitals:   Apr 11, 2022 1615 Apr 11, 2022 1630 April 11, 2022 1641 04/11/22 1717  BP: (!) 111/50 (!) 111/51  (!) 104/46  Pulse: 95 91  69  Resp: (!) 26 (!) 26  (!) 32  Temp:   (!) 96.4 F (35.8 C) 97.7 F (36.5 C)  TempSrc:   Axillary Axillary  SpO2: (!) 78% (!) 79%  (!) 45%  Weight:      Height:       Eyes: PERRL, lids and conjunctivae normal ENMT: Mucous membranes are dry.  Posterior pharynx clear of any exudate or lesions.Normal dentition.  Neck: normal, supple, no masses, no thyromegaly Respiratory: Coarse breath sounds bilaterally with rhonchi, no wheezing, no crackles.  Increased respiratory effort.  Moderate accessory muscle use.  Cardiovascular: Regular rate and rhythm, no murmurs / rubs / gallops. No extremity edema. 2+ pedal pulses. No carotid bruits.  Abdomen: no tenderness, no masses palpated. No hepatosplenomegaly. Bowel sounds positive.  Musculoskeletal: no clubbing / cyanosis. No joint deformity upper and lower extremities. Good ROM, no contractures. Normal muscle tone.  Skin: no rashes, lesions, ulcers. No induration Neurologic: Not following commands; sensation intact    Labs on Admission: I have personally reviewed following labs and imaging  studies  CBC: Recent Labs  Lab Apr 11, 2022 1459  WBC 9.7  NEUTROABS 3.0  HGB 12.4*  HCT 40.1  MCV 113.0*  PLT 276   Basic Metabolic Panel: Recent Labs  Lab 2022/04/11 1459  NA 142  K 3.8  CL 105  CO2 30  GLUCOSE 260*  BUN 21  CREATININE 0.77  CALCIUM 9.1   GFR: Estimated Creatinine Clearance: 82.2 mL/min (by C-G formula based on SCr of 0.77 mg/dL). Liver Function Tests: Recent Labs  Lab 04/11/22 1459  AST 392*  ALT 465*  ALKPHOS 155*  BILITOT 0.3  PROT 6.6  ALBUMIN 3.3*   Urine analysis:    Component Value Date/Time   COLORURINE YELLOW 11/27/2019 1517   APPEARANCEUR CLEAR 11/27/2019 1517   LABSPEC  1.021 11/27/2019 1517   PHURINE 5.0 11/27/2019 1517   GLUCOSEU 150 (A) 11/27/2019 1517   HGBUR SMALL (A) 11/27/2019 1517   BILIRUBINUR NEGATIVE 11/27/2019 1517   KETONESUR NEGATIVE 11/27/2019 1517   PROTEINUR NEGATIVE 11/27/2019 1517   UROBILINOGEN 0.2 12/08/2014 1210   NITRITE NEGATIVE 11/27/2019 1517   LEUKOCYTESUR NEGATIVE 11/27/2019 1517    Radiological Exams on Admission: DG Chest Port 1 View  Result Date: Apr 20, 2022 CLINICAL DATA:  Hypoxia EXAM: PORTABLE CHEST 1 VIEW COMPARISON:  Chest 11/27/2019 FINDINGS: Elevated left hemidiaphragm with left lower lobe airspace disease. Mild progression from the prior study. Right lung is clear. Negative for heart failure or edema. No pneumothorax. IMPRESSION: Progression of elevated left hemidiaphragm and probable left lower lobe atelectasis. Right lung remains clear Electronically Signed   By: Marlan Palau M.D.   On: Apr 20, 2022 15:05     Assessment/Plan Principal Problem:   Acute respiratory failure with hypoxemia (HCC) Active Problems:   Controlled type 2 diabetes mellitus with complication, with long-term current use of insulin (HCC)   Dementia (HCC)    Patient now presents with acute respiratory failure with hypoxemia likely from an aspiration event which is likely a terminal event for him as he is a DO NOT  RESUSCITATE.  He will be admitted into the hospital and kept comfortable with oxygen for comfort, nebulizers, morphine, pain medications, antiemetics and comfort measures.  His guardian is in agreement with the plan as presented to him by Dr. Effie Shy in the emergency department.  His guardian was not present at the time of my evaluation.  DVT prophylaxis: Comfort measures Code Status: DO NOT RESUSCITATE Family Communication: Case discussed with guardian by Dr. Effie Shy Disposition Plan: Patient likely to expire here Consults called: None Admission status: Inpatient   Lahoma Crocker MD FACP [facility] Pager [phone]  How to contact the [facility] Attending or Consulting provider 7A - 7P or covering provider during after hours 7P -7A, for this patient?  Check the care team in [facility] and look for a) attending/consulting [facility] provider listed and b) the [facility] team listed Log into [email or web address] and use [place]'s universal password to access. If you do not have the password, please contact the hospital operator. Locate the [facility] provider you are looking for under [facility] and page to a number that you can be directly reached. If you still have difficulty reaching the provider, please page the [facility] (Director on Call) for the Hospitalists listed on amion for assistance.  If 7PM-7AM, please contact night-coverage [email or web address] Password [facility]  2022-04-20, 5:21 PM

## 2022-04-19 NOTE — ED Notes (Signed)
Nasal airway placed by respiratory.

## 2022-04-19 DEATH — deceased
